# Patient Record
Sex: Female | Born: 1945
Health system: Southern US, Community
[De-identification: ages and names within clinical notes are randomized; demographics above are authoritative.]

## PROBLEM LIST (undated history)

## (undated) DIAGNOSIS — C801 Malignant (primary) neoplasm, unspecified: Secondary | ICD-10-CM

## (undated) DIAGNOSIS — K5790 Diverticulosis of intestine, part unspecified, without perforation or abscess without bleeding: Secondary | ICD-10-CM

## (undated) DIAGNOSIS — T7840XA Allergy, unspecified, initial encounter: Secondary | ICD-10-CM

## (undated) DIAGNOSIS — G8929 Other chronic pain: Secondary | ICD-10-CM

## (undated) DIAGNOSIS — M810 Age-related osteoporosis without current pathological fracture: Secondary | ICD-10-CM

## (undated) DIAGNOSIS — K8020 Calculus of gallbladder without cholecystitis without obstruction: Secondary | ICD-10-CM

## (undated) DIAGNOSIS — I10 Essential (primary) hypertension: Secondary | ICD-10-CM

## (undated) DIAGNOSIS — E669 Obesity, unspecified: Secondary | ICD-10-CM

## (undated) DIAGNOSIS — M1990 Unspecified osteoarthritis, unspecified site: Secondary | ICD-10-CM

## (undated) DIAGNOSIS — K760 Fatty (change of) liver, not elsewhere classified: Secondary | ICD-10-CM

## (undated) DIAGNOSIS — K219 Gastro-esophageal reflux disease without esophagitis: Secondary | ICD-10-CM

## (undated) DIAGNOSIS — E785 Hyperlipidemia, unspecified: Secondary | ICD-10-CM

## (undated) DIAGNOSIS — R0602 Shortness of breath: Secondary | ICD-10-CM

## (undated) HISTORY — DX: Diverticulosis of intestine, part unspecified, without perforation or abscess without bleeding: K57.90

## (undated) HISTORY — PX: COLONOSCOPY: SHX174

## (undated) HISTORY — PX: CHOLECYSTECTOMY: SHX55

## (undated) HISTORY — DX: Calculus of gallbladder without cholecystitis without obstruction: K80.20

## (undated) HISTORY — DX: Essential (primary) hypertension: I10

## (undated) HISTORY — DX: Hyperlipidemia, unspecified: E78.5

## (undated) HISTORY — DX: Age-related osteoporosis without current pathological fracture: M81.0

## (undated) HISTORY — DX: Fatty (change of) liver, not elsewhere classified: K76.0

## (undated) HISTORY — DX: Obesity, unspecified: E66.9

## (undated) HISTORY — DX: Gastro-esophageal reflux disease without esophagitis: K21.9

## (undated) HISTORY — PX: ABDOMINAL HYSTERECTOMY: SHX81

## (undated) HISTORY — DX: Malignant (primary) neoplasm, unspecified: C80.1

## (undated) HISTORY — PX: ERCP: SHX60

---

## 1997-09-15 ENCOUNTER — Other Ambulatory Visit: Admission: RE | Admit: 1997-09-15 | Discharge: 1997-09-15 | Payer: Self-pay | Admitting: *Deleted

## 1998-01-18 ENCOUNTER — Ambulatory Visit (HOSPITAL_COMMUNITY): Admission: RE | Admit: 1998-01-18 | Discharge: 1998-01-18 | Payer: Self-pay | Admitting: Rheumatology

## 1998-07-12 ENCOUNTER — Encounter: Admission: RE | Admit: 1998-07-12 | Discharge: 1998-08-09 | Payer: Self-pay | Admitting: Family Medicine

## 1998-11-30 ENCOUNTER — Other Ambulatory Visit: Admission: RE | Admit: 1998-11-30 | Discharge: 1998-11-30 | Payer: Self-pay | Admitting: *Deleted

## 1999-01-26 ENCOUNTER — Encounter: Payer: Self-pay | Admitting: Rheumatology

## 1999-01-26 ENCOUNTER — Inpatient Hospital Stay (HOSPITAL_COMMUNITY): Admission: AD | Admit: 1999-01-26 | Discharge: 1999-01-28 | Payer: Self-pay | Admitting: Rheumatology

## 1999-01-27 ENCOUNTER — Encounter: Payer: Self-pay | Admitting: Rheumatology

## 1999-02-03 ENCOUNTER — Encounter: Payer: Self-pay | Admitting: Internal Medicine

## 1999-02-03 ENCOUNTER — Encounter: Payer: Self-pay | Admitting: Gastroenterology

## 1999-02-03 ENCOUNTER — Encounter (INDEPENDENT_AMBULATORY_CARE_PROVIDER_SITE_OTHER): Payer: Self-pay | Admitting: *Deleted

## 1999-02-03 ENCOUNTER — Observation Stay (HOSPITAL_COMMUNITY): Admission: EM | Admit: 1999-02-03 | Discharge: 1999-02-04 | Payer: Self-pay | Admitting: Gastroenterology

## 1999-04-15 ENCOUNTER — Encounter: Payer: Self-pay | Admitting: Gastroenterology

## 1999-04-15 ENCOUNTER — Encounter: Payer: Self-pay | Admitting: Internal Medicine

## 1999-04-15 ENCOUNTER — Ambulatory Visit (HOSPITAL_COMMUNITY): Admission: RE | Admit: 1999-04-15 | Discharge: 1999-04-15 | Payer: Self-pay | Admitting: Gastroenterology

## 1999-04-15 ENCOUNTER — Encounter (INDEPENDENT_AMBULATORY_CARE_PROVIDER_SITE_OTHER): Payer: Self-pay | Admitting: *Deleted

## 1999-05-05 ENCOUNTER — Emergency Department (HOSPITAL_COMMUNITY): Admission: EM | Admit: 1999-05-05 | Discharge: 1999-05-05 | Payer: Self-pay | Admitting: Emergency Medicine

## 1999-07-20 ENCOUNTER — Encounter: Admission: RE | Admit: 1999-07-20 | Discharge: 1999-07-20 | Payer: Self-pay | Admitting: Rheumatology

## 1999-07-20 ENCOUNTER — Encounter: Payer: Self-pay | Admitting: Rheumatology

## 1999-09-26 ENCOUNTER — Emergency Department (HOSPITAL_COMMUNITY): Admission: EM | Admit: 1999-09-26 | Discharge: 1999-09-27 | Payer: Self-pay

## 1999-09-26 ENCOUNTER — Encounter: Payer: Self-pay | Admitting: Emergency Medicine

## 2000-03-29 ENCOUNTER — Other Ambulatory Visit: Admission: RE | Admit: 2000-03-29 | Discharge: 2000-03-29 | Payer: Self-pay | Admitting: *Deleted

## 2001-04-08 ENCOUNTER — Encounter: Admission: RE | Admit: 2001-04-08 | Discharge: 2001-04-08 | Payer: Self-pay | Admitting: Rheumatology

## 2001-04-08 ENCOUNTER — Encounter: Payer: Self-pay | Admitting: Rheumatology

## 2001-04-09 ENCOUNTER — Other Ambulatory Visit: Admission: RE | Admit: 2001-04-09 | Discharge: 2001-04-09 | Payer: Self-pay | Admitting: *Deleted

## 2002-04-25 ENCOUNTER — Encounter: Admission: RE | Admit: 2002-04-25 | Discharge: 2002-04-25 | Payer: Self-pay | Admitting: Rheumatology

## 2002-04-25 ENCOUNTER — Encounter: Payer: Self-pay | Admitting: Rheumatology

## 2002-05-08 ENCOUNTER — Other Ambulatory Visit: Admission: RE | Admit: 2002-05-08 | Discharge: 2002-05-08 | Payer: Self-pay | Admitting: *Deleted

## 2002-12-14 ENCOUNTER — Emergency Department (HOSPITAL_COMMUNITY): Admission: EM | Admit: 2002-12-14 | Discharge: 2002-12-14 | Payer: Self-pay | Admitting: Emergency Medicine

## 2002-12-17 ENCOUNTER — Encounter (INDEPENDENT_AMBULATORY_CARE_PROVIDER_SITE_OTHER): Payer: Self-pay | Admitting: *Deleted

## 2002-12-17 ENCOUNTER — Encounter: Payer: Self-pay | Admitting: Internal Medicine

## 2002-12-17 ENCOUNTER — Ambulatory Visit (HOSPITAL_COMMUNITY): Admission: RE | Admit: 2002-12-17 | Discharge: 2002-12-17 | Payer: Self-pay | Admitting: Gastroenterology

## 2002-12-17 ENCOUNTER — Encounter: Payer: Self-pay | Admitting: Gastroenterology

## 2002-12-22 ENCOUNTER — Emergency Department (HOSPITAL_COMMUNITY): Admission: EM | Admit: 2002-12-22 | Discharge: 2002-12-22 | Payer: Self-pay | Admitting: Emergency Medicine

## 2002-12-22 ENCOUNTER — Encounter: Payer: Self-pay | Admitting: Emergency Medicine

## 2003-06-18 ENCOUNTER — Other Ambulatory Visit: Admission: RE | Admit: 2003-06-18 | Discharge: 2003-06-18 | Payer: Self-pay | Admitting: *Deleted

## 2003-08-26 ENCOUNTER — Observation Stay (HOSPITAL_COMMUNITY): Admission: EM | Admit: 2003-08-26 | Discharge: 2003-08-27 | Payer: Self-pay | Admitting: Emergency Medicine

## 2003-10-08 ENCOUNTER — Encounter: Admission: RE | Admit: 2003-10-08 | Discharge: 2003-10-08 | Payer: Self-pay | Admitting: Rheumatology

## 2004-07-11 ENCOUNTER — Other Ambulatory Visit: Admission: RE | Admit: 2004-07-11 | Discharge: 2004-07-11 | Payer: Self-pay | Admitting: *Deleted

## 2004-11-23 ENCOUNTER — Encounter: Admission: RE | Admit: 2004-11-23 | Discharge: 2004-11-23 | Payer: Self-pay | Admitting: Rheumatology

## 2005-09-20 ENCOUNTER — Encounter: Admission: RE | Admit: 2005-09-20 | Discharge: 2005-09-20 | Payer: Self-pay | Admitting: Gastroenterology

## 2005-09-20 ENCOUNTER — Encounter (INDEPENDENT_AMBULATORY_CARE_PROVIDER_SITE_OTHER): Payer: Self-pay | Admitting: *Deleted

## 2005-09-20 ENCOUNTER — Encounter: Payer: Self-pay | Admitting: Internal Medicine

## 2006-01-03 ENCOUNTER — Encounter: Admission: RE | Admit: 2006-01-03 | Discharge: 2006-01-03 | Payer: Self-pay | Admitting: Rheumatology

## 2006-09-17 ENCOUNTER — Other Ambulatory Visit: Admission: RE | Admit: 2006-09-17 | Discharge: 2006-09-17 | Payer: Self-pay | Admitting: *Deleted

## 2007-01-08 ENCOUNTER — Encounter: Admission: RE | Admit: 2007-01-08 | Discharge: 2007-01-08 | Payer: Self-pay | Admitting: Endocrinology

## 2007-10-07 ENCOUNTER — Other Ambulatory Visit: Admission: RE | Admit: 2007-10-07 | Discharge: 2007-10-07 | Payer: Self-pay | Admitting: Gynecology

## 2008-01-10 ENCOUNTER — Encounter: Admission: RE | Admit: 2008-01-10 | Discharge: 2008-01-10 | Payer: Self-pay | Admitting: Endocrinology

## 2008-08-06 ENCOUNTER — Emergency Department (HOSPITAL_COMMUNITY): Admission: EM | Admit: 2008-08-06 | Discharge: 2008-08-07 | Payer: Self-pay | Admitting: Emergency Medicine

## 2008-08-07 ENCOUNTER — Ambulatory Visit (HOSPITAL_COMMUNITY): Admission: RE | Admit: 2008-08-07 | Discharge: 2008-08-07 | Payer: Self-pay | Admitting: Internal Medicine

## 2008-08-07 ENCOUNTER — Encounter (INDEPENDENT_AMBULATORY_CARE_PROVIDER_SITE_OTHER): Payer: Self-pay | Admitting: *Deleted

## 2008-08-23 ENCOUNTER — Emergency Department (HOSPITAL_COMMUNITY): Admission: EM | Admit: 2008-08-23 | Discharge: 2008-08-23 | Payer: Self-pay | Admitting: Family Medicine

## 2008-12-09 ENCOUNTER — Encounter (INDEPENDENT_AMBULATORY_CARE_PROVIDER_SITE_OTHER): Payer: Self-pay | Admitting: *Deleted

## 2008-12-11 ENCOUNTER — Ambulatory Visit: Payer: Self-pay | Admitting: Internal Medicine

## 2008-12-11 DIAGNOSIS — K5900 Constipation, unspecified: Secondary | ICD-10-CM | POA: Insufficient documentation

## 2008-12-11 DIAGNOSIS — K219 Gastro-esophageal reflux disease without esophagitis: Secondary | ICD-10-CM | POA: Insufficient documentation

## 2008-12-11 DIAGNOSIS — K8050 Calculus of bile duct without cholangitis or cholecystitis without obstruction: Secondary | ICD-10-CM | POA: Insufficient documentation

## 2008-12-11 DIAGNOSIS — R1011 Right upper quadrant pain: Secondary | ICD-10-CM

## 2008-12-11 DIAGNOSIS — E669 Obesity, unspecified: Secondary | ICD-10-CM | POA: Insufficient documentation

## 2009-09-08 ENCOUNTER — Emergency Department (HOSPITAL_COMMUNITY): Admission: EM | Admit: 2009-09-08 | Discharge: 2009-09-08 | Payer: Self-pay | Admitting: Emergency Medicine

## 2010-06-20 LAB — BASIC METABOLIC PANEL
CO2: 29 mEq/L (ref 19–32)
GFR calc non Af Amer: >60 mL/min (ref >60)
Glucose, Bld: 121 mg/dL — ABNORMAL HIGH (ref 70–99)
Potassium: 3.3 mEq/L — ABNORMAL LOW (ref 3.5–5.1)
Sodium: 141 mEq/L (ref 135–145)

## 2010-06-20 LAB — DIFFERENTIAL
Basophils Absolute: 0 10*3/uL (ref 0.0–0.1)
Eosinophils Relative: 1 % (ref 0–5)
Lymphocytes Relative: 29 % (ref 12–46)
Monocytes Absolute: 0.9 10*3/uL (ref 0.1–1.0)
Monocytes Relative: 10 % (ref 3–12)

## 2010-06-20 LAB — CBC
HCT: 40 % (ref 36.0–46.0)
Hemoglobin: 13.1 g/dL (ref 12.0–15.0)
MCHC: 32.9 g/dL (ref 30.0–36.0)
RDW: 13.9 % (ref 11.5–15.5)

## 2010-06-20 LAB — POCT CARDIAC MARKERS
CKMB, poc: 1.2 ng/mL (ref 1.0–8.0)
Myoglobin, poc: 80.7 ng/mL (ref 12–200)

## 2010-07-12 LAB — URINALYSIS, ROUTINE W REFLEX MICROSCOPIC
Bilirubin Urine: NEGATIVE
Glucose, UA: NEGATIVE mg/dL
Hgb urine dipstick: NEGATIVE
Ketones, ur: NEGATIVE mg/dL
Protein, ur: NEGATIVE mg/dL

## 2010-07-12 LAB — DIFFERENTIAL
Basophils Absolute: 0 10*3/uL (ref 0.0–0.1)
Basophils Relative: 0 % (ref 0–1)
Eosinophils Relative: 0 % (ref 0–5)
Lymphocytes Relative: 17 % (ref 12–46)
Neutro Abs: 9 10*3/uL — ABNORMAL HIGH (ref 1.7–7.7)

## 2010-07-12 LAB — COMPREHENSIVE METABOLIC PANEL
AST: 86 U/L — ABNORMAL HIGH (ref 0–37)
Alkaline Phosphatase: 114 U/L (ref 39–117)
BUN: 14 mg/dL (ref 6–23)
CO2: 27 mEq/L (ref 19–32)
Chloride: 107 mEq/L (ref 96–112)
Creatinine, Ser: 0.81 mg/dL (ref 0.4–1.2)
GFR calc non Af Amer: >60 mL/min (ref >60)
Potassium: 3.7 mEq/L (ref 3.5–5.1)
Total Bilirubin: 0.8 mg/dL (ref 0.3–1.2)

## 2010-07-12 LAB — CBC
HCT: 41.7 % (ref 36.0–46.0)
MCV: 94.4 fL (ref 78.0–100.0)
RBC: 4.42 MIL/uL (ref 3.87–5.11)
WBC: 11.8 10*3/uL — ABNORMAL HIGH (ref 4.0–10.5)

## 2010-08-19 NOTE — H&P (Signed)
NAME:  Krystal Watson, Krystal Watson                          ACCOUNT NO.:  [id number]   MEDICAL RECORD NO.:  [id number]                   PATIENT TYPE:  OBV   LOCATION:  3735                                 FACILITY:  [facility]   PHYSICIAN:  Meade Maw, M.D.                 DATE OF BIRTH:  1945-08-04   DATE OF ADMISSION:  08/26/2003  DATE OF DISCHARGE:                                HISTORY & PHYSICAL   REASON FOR OBSERVATION:  Chest pain.   HISTORY:  Ellason Segar is a very pleasant 65 year old female who presented  to her primary care physician, Dr. Demetria Pore. Levitin, with complaints of  precordial chest pain associated with nausea and diaphoresis.  The chest  pain itself was approximately 1 minute in duration, however, she  subsequently developed left arm pain which persisted for approximately 10  minutes.  The chest pain was described as a 5/10 in severity.  There were no  real aggravating or alleviating factors noted.  She had had a similar  episode of left arm pain 1 week prior to this presentation, otherwise, she  has had no chest pain.  She has significant coronary risk factors including  postmenopausal status, hypertension, dyslipidemia and diabetes mellitus.  She currently is pain-free.   PAST MEDICAL HISTORY:  Her past medical history is as noted above,  significant for:  1. Diabetes mellitus since 1998.  2. Hyperlipidemia.  3. Hypertension.  4. Cholelithiasis, status post ERCP with stone extraction.   PAST SURGICAL HISTORY:  Past surgical history is significant for:  1. Cholecystectomy.  2. Vaginal hysterectomy for fibroids.  3. Right toe surgery.   CURRENT MEDICATIONS:  Current medications include:  1. Diltiazem 240 mg daily.  2. Altace 10 mg daily.  3. Hydrochlorothiazide 25 mg daily.  4. Actos 30 mg daily.  5. Metformin 1 g in the morning, 1 at night.  6. Nexium 40 mg in the morning.  7. Lipitor 80 mg daily.  8. Calcium.  9. Enteric-coated aspirin 81 mg daily.   ALLERGIES:  Allergies are to SULFA.   FAMILY HISTORY:  She is married, lives with her husband, teaches high school  English.   FAMILY HISTORY:  Family history significant for diabetes and hypertension.  There is no history of alcohol or tobacco use.   REVIEW OF SYSTEMS:  She denies palpitations or tachyarrhythmias.  She has  had mild pedal edema which is worse in the evening.  She has had no  orthopnea.  She has had noted no blood in the stool.  She has had no recent  headaches.   PHYSICAL EXAMINATION:  VITAL SIGNS:  On physical exam, blood pressure is  122/70 in the right arm, 124/70, left arm; heart rate is 90; respiratory  rate is 20; temperature is 99.1.  HEENT:  Unremarkable.  NECK:  She has good carotid upstroke, there are no carotid bruits.  PULMONARY:  Exam  reveals breath sounds which are equal and clear to  auscultation.  CARDIOVASCULAR:  Exam reveals a regular rate and rhythm, normal S1, normal  S2, no murmurs, rubs, or gallops noted.  ABDOMEN:  Abdomen is soft, benign, nontender.  EXTREMITIES:  Extremities reveal mild pedal edema.  NEUROLOGIC:  Nonfocal.  SKIN:  Skin is warm and dry.   LABORATORY AND ACCESSORY CLINICAL DATA:  ECG was reviewed and reveals a  normal sinus rhythm with no ischemic changes noted.   IMPRESSION:  31. A 65 year old female who is currently pain-free, but with significant     risk factors with prolonged arm pain.  We will admit for observation,     obtain serial cardiac enzymes.  Stress Cardiolite will be performed.     Serial enzymes are normal.  She will continue with aspirin 81 mg daily.     She has been given aspirin 325 mg in the office.  2. Hypertension.  Blood pressure is currently well-controlled.  We will     continue with her current regimen of diltiazem, Altace and     hydrochlorothiazide.  3. Diabetes mellitus.  We will continue with her current regimen of Actos     and Metformin.  4. Dyslipidemia.  We will continue with Lipitor  at 80 mg daily.                                                Meade Maw, M.D.    HP/MEDQ  D:  08/26/2003  T:  08/27/2003  Job:  [id number]

## 2010-08-19 NOTE — Op Note (Signed)
NAME:  Krystal Watson, Krystal Watson                          ACCOUNT NO.:  [id number]   MEDICAL RECORD NO.:  [id number]                   PATIENT TYPE:  AMB   LOCATION:  ENDO                                 FACILITY:  [facility]   PHYSICIAN:  Petra Kuba, M.D.                 DATE OF BIRTH:  1946-01-18   DATE OF PROCEDURE:  12/17/2002  DATE OF DISCHARGE:                                 OPERATIVE REPORT   PROCEDURE PERFORMED:  Endoscopic retrograde cholangiopancreatography with  sphincterotomy and stone extraction.   ENDOSCOPIST:  Petra Kuba, M.D.   INDICATIONS FOR PROCEDURE:  Patient with right upper quadrant pain, fever,  elevated liver tests, history of common bile duct stone and sphincterotomy  four years ago.  Probable recurrence.  Consent was signed after the risks,  benefits, methods and options were thoroughly rediscussed today in the  office with both the patient and her husband.   MEDICINES USED:  Demerol 125 mg, Versed 10 mg.   DESCRIPTION OF PROCEDURE:  The side-viewing therapeutic video duodenoscope  was inserted by indirect vision into the stomach, advanced into the duodenum  and a normal appearing ampulla was brought into view.  There was a small  periampullary diverticulum.  Initially, we cannulated one ostia and a normal  PD injection was obtained.  Once we realized it was the PD, it was not  overfilled.  It did drain readily.  We did try to recannulate but did not  inject dye but could not get adequate positioning to feel like we were  angling towards the CBD.  We did try probing with the wire one time which  went up the PD preferentially.  We stopped advancing the wire, once we  realized the direction it was going.  On further inspection of the ampulla,  there seemed to be a second ostia which when cannulated, deep selective  cannulation in the CBD was obtained.  On initial injection, a moderate sized  CBD stone was seen but no other gross abnormalities.  The JAG-wire was  easily inserted deep into the intrahepatics.  We went head and proceeded  with a sphincterotomy in the customary fashion until we were easily able to  get the fully bowed sphincterotome in and out of the duct.  She did have an  overall small ampulla so this was not a very large sphincterotomy.  Once  that was done we exchanged for a 12 mm balloon which did have moderate to  significant resistance in withdrawing through the ampulla and lots of debris  and small stones and sludge were withdrawn.  We did pop the balloon after  three pullthroughs.  We elected at this juncture since there was still a lot  of sludge in the bile duct to increase the sphincterotomy site which was  done in a customary fashion.  Again, a fully bowed sphincterotome was easily  able to be  advanced in and out of the duct.  We did enlarge a moderate  amount.  We went ahead and used an 8.5 mm balloon which withdrew through the  ampulla much easier with only mild to moderate resistance and again on each  pull through less stones and sludge and debris were withdrawn.  We did try  to do an occlusion cholangiogram after about five pull throughs but there  was still too much air in the system, so we went ahead and recannulated with  the 12 mm balloon.  As an aside, based on the increased sphincterotomy, we  did not need to use the wire and based on her having two separate ostia, we  were easily able to recannulate with just the balloon. The 12 mm balloon did  withdraw a little bit of sludge on each pull through which was done three or  four times and occlusion cholangiogram did not reveal any obvious filling  defect.  This balloon pass through with only mild to moderate resistance as  well.  We elected to stop the procedure at this juncture.  The scope was  removed.  The patient tolerated the procedure well. There were no obvious  immediate complication.   ENDOSCOPIC DIAGNOSIS:  1. Small to normal ampulla with a small  periampullary diverticulum.  2. Two ostia.  3. One normal pancreatic duct injection and one advancement of the wire into     the pancreatic duct.  4. Cannulated the second ostia with deep selective cannulation of the common     bile duct and one stone seen on initial injection.  5. Increased sphincterotomy times two.  6. Multiple 8.5 and 12 mm balloon pull throughs with multiple debris, stones     and sludge.  7. Negative occlusion cholangiogram at the end of the procedure with     excellent drainage.   PLAN:  Customary post endoscopic retrograde cholangiopancreatography orders  if no problem in a few hours' observation in endo unit, feel should be okay  to go home.  Happy to see back p.r.n. or in two weeks to check symptoms and  labs to make sure no further work-up plans are needed.                                               Petra Kuba, M.D.    MEM/MEDQ  D:  12/17/2002  T:  12/18/2002  Job:  [id number]   cc:   Demetria Pore. Coral Spikes, M.D.  [address]  [address]  [place]  [place] [address]  Fax: [phone]

## 2011-05-31 ENCOUNTER — Encounter: Payer: Self-pay | Admitting: Internal Medicine

## 2011-05-31 ENCOUNTER — Ambulatory Visit (INDEPENDENT_AMBULATORY_CARE_PROVIDER_SITE_OTHER): Payer: BC Managed Care – PPO | Admitting: Internal Medicine

## 2011-05-31 VITALS — BP 134/76 | HR 62 | Ht 63.0 in | Wt 266.0 lb

## 2011-05-31 DIAGNOSIS — Z800 Family history of malignant neoplasm of digestive organs: Secondary | ICD-10-CM

## 2011-05-31 DIAGNOSIS — K5900 Constipation, unspecified: Secondary | ICD-10-CM

## 2011-05-31 DIAGNOSIS — E669 Obesity, unspecified: Secondary | ICD-10-CM

## 2011-05-31 DIAGNOSIS — R143 Flatulence: Secondary | ICD-10-CM

## 2011-05-31 DIAGNOSIS — E119 Type 2 diabetes mellitus without complications: Secondary | ICD-10-CM

## 2011-05-31 DIAGNOSIS — R141 Gas pain: Secondary | ICD-10-CM

## 2011-05-31 MED ORDER — ALIGN 4 MG PO CAPS: 1.0000 | ORAL_CAPSULE | Freq: Every day | ORAL | Status: DC

## 2011-05-31 MED ORDER — PEG-KCL-NACL-NASULF-NA ASC-C 100 G PO SOLR: 1.0000 | Freq: Once | ORAL | Status: DC

## 2011-05-31 NOTE — Patient Instructions (Signed)
You have been scheduled for a colonoscopy with propofol. Please follow written instructions given to you at your visit today.  Please pick up your prep kit at the pharmacy within the next 1-3 days.  We have given you samples of Align. This puts good bacteria back into your intestines. You should take 1 capsule by mouth once daily. If this works well for you, it can be purchased over the counter.

## 2011-05-31 NOTE — Progress Notes (Signed)
HISTORY OF PRESENT ILLNESS:  Krystal Watson is a 66 y.o. female African American female withdiabetes mellitus, hypertension, hyperlipidemia, and morbid obesity. She self-referred regarding problems with increased intestinal gas. She has a history of chronic right-sided pain. She is status post hysterectomy and cholecystectomy. She has a history of choledocholithiasis for which she underwent ERCP with common duct stone removal by Dr. Vida Rigger (2001 and again in 2004). She was last seen in September 2010 regarding right-sided abdominal pain that she's had this for years (see that dictation for details).  She has multiple volumes of outside records which I have reviewed previously. These include multiple office visits with Dr. Ewing Schlein as well as multiple procedures and laboratories.. In summary, she has had complaints of right-sided abdominal pain of at least 10 years duration. In addition to 2 ERCPs as described, she has had incomplete colonoscopy with coupled negative barium enema in 2001 and negative virtual colonoscopy in 2007.[name] Other GI complaints include intermittent heartburn and indigestion with regurgitation. Currently on no medications. Also, chronic intermittent constipation which continues. Today, she reports having had severe sharp epigastric discomfort after consuming a hot dog with chilly, onions, and coleslaw. She subsequently took Gaviscon and then road her exercise bike, all in the attempts of inducing belching. At one point, she belched multiple times with complete relief of abdominal discomfort. Her other complaint is that of bloating. No nausea, vomiting, or dysphagia. She continues to be significantly overweight. Her constipation is without change, but she denies bleeding. She does have family history of colon cancer in her father ( advanced age) with previous evaluations as noted above. She is due for, and interested in, followup.   REVIEW OF SYSTEMS:  All non-GI ROS negative except for  fatigue and night sweats, joint aches  Past Medical History  Diagnosis Date  . Diabetes mellitus   . Diverticulosis   . Gallstones   . Hyperlipidemia   . Hypertension   . Obesity     Past Surgical History  Procedure Date  . Cholecystectomy   . Abdominal hysterectomy     Social History MILI PILTZ  reports that she has never smoked. She has never used smokeless tobacco. She reports that she drinks alcohol. She reports that she does not use illicit drugs.  family history includes Breast cancer in her sister; Colon cancer (age of onset:80) in her father; Diabetes in her mother; and Heart disease in her mother.  Allergies  Allergen Reactions  . Sulfonamide Derivatives        PHYSICAL EXAMINATION: Vital signs: BP 134/76  Pulse 62  Ht 5\' 3"  (1.6 m)  Wt 266 lb (120.657 kg)  BMI 47.12 kg/m2  Constitutional: obese,generally well-appearing, no acute distress Psychiatric: alert and oriented x3, cooperative Eyes: extraocular movements intact, anicteric, conjunctiva pink Mouth: oral pharynx moist, no lesions Neck: supple no lymphadenopathy Cardiovascular: heart regular rate and rhythm, no murmur Lungs: clear to auscultation bilaterally Abdomen: soft,obese, nontender, nondistended, no obvious ascites, no peritoneal signs, normal bowel sounds, no organomegaly Rectal:deferred until colonoscopy Extremities: no lower extremity edema bilaterally Skin: no lesions on visible extremities Neuro: No focal deficits.   ASSESSMENT:  #1. Increased intestinal gas with dietary indiscretion #2. Family history of colon cancer #3. Morbid obesity #4. Diabetes mellitus #5. Functional constipation. Increase fiber and water. MiraLax when necessary   PLAN:  #1. Gas and flatulence dietary sheet provided #2. Discussion on intestinal gas. As well, literature on intestinal gas provided for her review #3. Empiric course of probiotic  Align, one daily for 2 weeks. Samples given #4. High-risk  screening colonoscopy. Incomplete exam noted previously. CRNA supervise propofol planned.The nature of the procedure, as well as the risks, benefits, and alternatives were carefully and thoroughly reviewed with the patient. Ample time for discussion and questions allowed. The patient understood, was satisfied, and agreed to proceed. Movi prep prescribed. The patient instructed on its use #5. Hold diabetic medications the morning of the examination until oral intake resumed

## 2011-06-13 ENCOUNTER — Encounter: Payer: Self-pay | Admitting: Internal Medicine

## 2011-06-13 ENCOUNTER — Ambulatory Visit (AMBULATORY_SURGERY_CENTER): Payer: BC Managed Care – PPO | Admitting: Internal Medicine

## 2011-06-13 VITALS — BP 121/95 | HR 57 | Temp 97.3°F | Resp 18 | Ht 63.0 in | Wt 266.0 lb

## 2011-06-13 DIAGNOSIS — R142 Eructation: Secondary | ICD-10-CM

## 2011-06-13 DIAGNOSIS — Z1211 Encounter for screening for malignant neoplasm of colon: Secondary | ICD-10-CM

## 2011-06-13 DIAGNOSIS — K5900 Constipation, unspecified: Secondary | ICD-10-CM

## 2011-06-13 DIAGNOSIS — Z800 Family history of malignant neoplasm of digestive organs: Secondary | ICD-10-CM

## 2011-06-13 LAB — GLUCOSE, CAPILLARY: Glucose-Capillary: 99 mg/dL (ref 70–99)

## 2011-06-13 MED ORDER — SODIUM CHLORIDE 0.9 % IV SOLN: 500.0000 mL | INTRAVENOUS | Status: DC

## 2011-06-13 NOTE — Patient Instructions (Signed)
YOU HAD AN ENDOSCOPIC PROCEDURE TODAY AT THE [facility]: Refer to the procedure report that was given to you for any specific questions about what was found during the examination.  If the procedure report does not answer your questions, please call your gastroenterologist to clarify.  If you requested that your care partner not be given the details of your procedure findings, then the procedure report has been included in a sealed envelope for you to review at your convenience later.  YOU SHOULD EXPECT: Some feelings of bloating in the abdomen. Passage of more gas than usual.  Walking can help get rid of the air that was put into your GI tract during the procedure and reduce the bloating. If you had a lower endoscopy (such as a colonoscopy or flexible sigmoidoscopy) you may notice spotting of blood in your stool or on the toilet paper. If you underwent a bowel prep for your procedure, then you may not have a normal bowel movement for a few days.  DIET: Your first meal following the procedure should be a light meal and then it is ok to progress to your normal diet.  A half-sandwich or bowl of soup is an example of a good first meal.  Heavy or fried foods are harder to digest and may make you feel nauseous or bloated.  Likewise meals heavy in dairy and vegetables can cause extra gas to form and this can also increase the bloating.  Drink plenty of fluids but you should avoid alcoholic beverages for 24 hours.  ACTIVITY: Your care partner should take you home directly after the procedure.  You should plan to take it easy, moving slowly for the rest of the day.  You can resume normal activity the day after the procedure however you should NOT DRIVE or use heavy machinery for 24 hours (because of the sedation medicines used during the test).    SYMPTOMS TO REPORT IMMEDIATELY: A gastroenterologist can be reached at any hour.  During normal business hours, 8:30 AM to 5:00 PM Monday through Friday,  call [phone].  After hours and on weekends, please call the GI answering service at [phone] who will take a message and have the physician on call contact you.   Following lower endoscopy (colonoscopy or flexible sigmoidoscopy):  Excessive amounts of blood in the stool  Significant tenderness or worsening of abdominal pains  Swelling of the abdomen that is new, acute  Fever of 100F or higher    FOLLOW UP: If any biopsies were taken you will be contacted by phone or by letter within the next 1-3 weeks.  Call your gastroenterologist if you have not heard about the biopsies in 3 weeks.  Our staff will call the home number listed on your records the next business day following your procedure to check on you and address any questions or concerns that you may have at that time regarding the information given to you following your procedure. This is a courtesy call and so if there is no answer at the home number and we have not heard from you through the emergency physician on call, we will assume that you have returned to your regular daily activities without incident.  SIGNATURES/CONFIDENTIALITY: You and/or your care partner have signed paperwork which will be entered into your electronic medical record.  These signatures attest to the fact that that the information above on your After Visit Summary has been reviewed and is understood.  Full responsibility of the confidentiality   of this discharge information lies with you and/or your care-partner.     

## 2011-06-13 NOTE — Progress Notes (Signed)
Patient did not experience any of the following events: a burn prior to discharge; a fall within the facility; wrong site/side/patient/procedure/implant event; or a hospital transfer or hospital admission upon discharge from the facility. (G8907) Patient did not have preoperative order for IV antibiotic SSI prophylaxis. (G8918)  

## 2011-06-13 NOTE — Op Note (Signed)
[facility] [address] [place], [place]  [address]  COLONOSCOPY PROCEDURE REPORT  PATIENT:  Krystal Watson, Krystal Watson  MR#:  [id number] BIRTHDATE:  1945-07-13, 65 yrs. old  GENDER:  female ENDOSCOPIST:  Wilhemina Bonito. Eda Keys, MD REF. BY:  Renford Dills, M.D. PROCEDURE DATE:  06/13/2011 PROCEDURE:  Higher-risk screening colonoscopy G0105  ASA CLASS:  Class II INDICATIONS:  surveillance and high-risk screening, family history of colon cancer (father advanced age) ; incomplete exam 2001 w/ BE and negative VC 2007 (both elsewhere) MEDICATIONS:   MAC sedation, administered by CRNA, propofol (Diprivan) 300 mg IV  DESCRIPTION OF PROCEDURE:   After the risks benefits and alternatives of the procedure were thoroughly explained, informed consent was obtained.  Digital rectal exam was performed and revealed no abnormalities.   The LB [id number] endoscope was introduced through the anus and advanced to the cecum, which was identified by both the appendix and ileocecal valve, without limitations.  The quality of the prep was excellent, using MoviPrep.  The instrument was then slowly withdrawn as the colon was fully examined. <<PROCEDUREIMAGES>>  FINDINGS:  Mild diverticulosis was found in the sigmoid colon. Otherwise normal colonoscopy without polyps, masses, vascular ectasias, or inflammatory changes.   Retroflexed views in the rectum revealed no abnormalities.  The time to cecum = 3:17 minutes. The scope was then withdrawn in  9:35  minutes from the cecum and the procedure completed.  COMPLICATIONS:  None  ENDOSCOPIC IMPRESSION: 1) Mild diverticulosis in the sigmoid colon 2) Otherwise normal colonoscopy  RECOMMENDATIONS: 1) Continue current colorectal screening recommendations for "routine risk" patients with a repeat colonoscopy in 10 years.  ______________________________ Wilhemina Bonito. Eda Keys, MD  CC:  Renford Dills MD;  The Patient  n. eSIGNED:   Wilhemina Bonito. Eda Keys at  06/13/2011 09:53 AM  Darrin Luis, [phone]

## 2011-06-14 ENCOUNTER — Telehealth: Payer: Self-pay | Admitting: *Deleted

## 2011-06-14 NOTE — Telephone Encounter (Signed)
  Follow up Call-  Call back number 06/13/2011  Post procedure Call Back phone  # [phone] hm  Permission to leave phone message Yes     Patient questions:  Do you have a fever, pain , or abdominal swelling? no Pain Score  0 *  Have you tolerated food without any problems? yes  Have you been able to return to your normal activities? yes  Do you have any questions about your discharge instructions: Diet   no Medications  no Follow up visit  no  Do you have questions or concerns about your Care? no  Actions: * If pain score is 4 or above: No action needed, pain <4.

## 2012-02-16 ENCOUNTER — Other Ambulatory Visit: Payer: Self-pay | Admitting: Gynecology

## 2012-02-16 DIAGNOSIS — R928 Other abnormal and inconclusive findings on diagnostic imaging of breast: Secondary | ICD-10-CM

## 2012-02-28 ENCOUNTER — Ambulatory Visit
Admission: RE | Admit: 2012-02-28 | Discharge: 2012-02-28 | Disposition: A | Discharge status: Home / Self Care | Payer: BC Managed Care – PPO | Source: Ambulatory Visit | Attending: Gynecology | Admitting: Gynecology

## 2012-02-28 ENCOUNTER — Other Ambulatory Visit: Payer: Self-pay | Admitting: Gynecology

## 2012-02-28 DIAGNOSIS — R928 Other abnormal and inconclusive findings on diagnostic imaging of breast: Secondary | ICD-10-CM

## 2012-03-12 ENCOUNTER — Ambulatory Visit
Admission: RE | Admit: 2012-03-12 | Discharge: 2012-03-12 | Disposition: A | Discharge status: Home / Self Care | Payer: BC Managed Care – PPO | Source: Ambulatory Visit | Attending: Gynecology | Admitting: Gynecology

## 2012-03-12 ENCOUNTER — Other Ambulatory Visit: Payer: Self-pay | Admitting: Gynecology

## 2012-03-12 DIAGNOSIS — R928 Other abnormal and inconclusive findings on diagnostic imaging of breast: Secondary | ICD-10-CM

## 2012-03-13 ENCOUNTER — Other Ambulatory Visit: Payer: Self-pay | Admitting: Gynecology

## 2012-03-13 DIAGNOSIS — C50911 Malignant neoplasm of unspecified site of right female breast: Secondary | ICD-10-CM

## 2012-03-15 ENCOUNTER — Encounter (INDEPENDENT_AMBULATORY_CARE_PROVIDER_SITE_OTHER): Payer: Self-pay | Admitting: Surgery

## 2012-03-15 ENCOUNTER — Ambulatory Visit (INDEPENDENT_AMBULATORY_CARE_PROVIDER_SITE_OTHER): Payer: BC Managed Care – PPO | Admitting: Surgery

## 2012-03-15 VITALS — BP 142/80 | HR 72 | Temp 97.2°F | Resp 16 | Ht 62.5 in | Wt 266.4 lb

## 2012-03-15 DIAGNOSIS — C50919 Malignant neoplasm of unspecified site of unspecified female breast: Secondary | ICD-10-CM

## 2012-03-15 DIAGNOSIS — C50911 Malignant neoplasm of unspecified site of right female breast: Secondary | ICD-10-CM

## 2012-03-15 NOTE — Patient Instructions (Signed)
I will plan to call you on Wednesday, after the report of the MRI is available. At that time we can make definite plans for surgery.

## 2012-03-15 NOTE — Progress Notes (Signed)
Patient ID: Krystal Watson, female   DOB: 1946-03-22, 66 y.o.   MRN: [id number]  Chief Complaint  Patient presents with  . Breast Cancer    Right breast    HPI Krystal Watson is a 66 y.o. female.  She recently had a mammogram done in abnormality was found. 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 Prognostic panel is pending. MRI is scheduled for next week.  The patient has no prior breast symptoms or breast problems. She had a sister who had breast cancer. Father had colon cancer. No other cancers known in the family. HPI  Past Medical History  Diagnosis Date  . Diabetes mellitus   . Diverticulosis   . Gallstones   . Hyperlipidemia   . Hypertension   . Obesity   . GERD (gastroesophageal reflux disease)   . Cancer     breast    Past Surgical History  Procedure Date  . Cholecystectomy   . Abdominal hysterectomy     Family History  Problem Relation Age of Onset  . Breast cancer Sister   . Cancer Sister     breast  . Colon cancer Father 61  . Cancer Father     colon  . Diabetes Mother   . Heart disease Mother   . Esophageal cancer Neg Hx   . Stomach cancer Neg Hx   . Rectal cancer Neg Hx     Social History History  Substance Use Topics  . Smoking status: Never Smoker   . Smokeless tobacco: Never Used  . Alcohol Use: Yes     Comment: rare    Allergies  Allergen Reactions  . Sulfonamide Derivatives     Pt can not remember the reaction    Current Outpatient Prescriptions  Medication Sig Dispense Refill  . aspirin 81 MG tablet Take 81 mg by mouth daily.      [name] atorvastatin (LIPITOR) 40 MG tablet Take 40 mg by mouth daily.      . Calcium Carbonate-Vitamin D (CALTRATE 600+D) 600-400 MG-UNIT per tablet Take 1 tablet by mouth daily.      [name] CINNAMON PO Take 2 capsules by mouth daily.      [name] DILTIAZEM HCL ER BEADS PO Take 1 tablet by mouth daily.      . hydrochlorothiazide (HYDRODIURIL) 25 MG  tablet Take 25 mg by mouth daily.      . metFORMIN (GLUCOPHAGE) 500 MG tablet Take 500 mg by mouth daily.      . Multiple Vitamins-Minerals (ONE-A-DAY WEIGHT SMART ADVANCE) TABS Take 1 tablet by mouth daily.      . pioglitazone (ACTOS) 30 MG tablet Take 30 mg by mouth daily.      . Probiotic Product (ALIGN) 4 MG CAPS Take 1 capsule by mouth daily.  14 capsule  0  . ramipril (ALTACE) 10 MG tablet Take 10 mg by mouth daily.      . Alum Hydroxide-Mag Carbonate (GAVISCON PO) Take by mouth as directed.        Review of Systems Review of Systems  Constitutional: Negative for fever, chills and unexpected weight change.  HENT: Negative for hearing loss, congestion, sore throat, trouble swallowing and voice change.   Eyes: Negative for visual disturbance.  Respiratory: Negative for cough and wheezing.   Cardiovascular: Negative for chest pain, palpitations and leg swelling.  Gastrointestinal: Positive for abdominal pain. Negative for nausea, vomiting, diarrhea, constipation, blood in stool, abdominal distention  and anal bleeding.       Chronic issues with right upper quadrant pain. She is status post a open cholecystectomy in the late 80s. She's had 2 ERCPs, no specific etiology has been determined for her chronic pain.  Genitourinary: Negative for hematuria, vaginal bleeding and difficulty urinating.  Musculoskeletal: Negative for arthralgias.  Skin: Negative for rash and wound.  Neurological: Negative for seizures, syncope and headaches.  Hematological: Negative for adenopathy. Bruises/bleeds easily.  Psychiatric/Behavioral: Negative for confusion.    Blood pressure 142/80, pulse 72, temperature 97.2 F (36.2 C), temperature source Temporal, resp. rate 16, height 5' 2.5" (1.588 m), weight 266 lb 6.4 oz (120.838 kg).  Physical Exam Physical Exam  Vitals reviewed. Constitutional: She is oriented to person, place, and time. She appears well-developed and well-nourished. No distress.  HENT:   Head: Normocephalic and atraumatic.  Mouth/Throat: Oropharynx is clear and moist.  Eyes: Conjunctivae normal and EOM are normal. Pupils are equal, round, and reactive to light. No scleral icterus.  Neck: Normal range of motion. Neck supple. No tracheal deviation present. No thyromegaly present.  Cardiovascular: Normal rate, regular rhythm, normal heart sounds and intact distal pulses.  Exam reveals no gallop and no friction rub.   No murmur heard. Pulmonary/Chest: Effort normal and breath sounds normal. No respiratory distress. She has no wheezes. She has no rales.  Abdominal: Soft. Bowel sounds are normal. She exhibits no distension and no mass. There is no tenderness. There is no rebound and no guarding.  Musculoskeletal: Normal range of motion. She exhibits no edema and no tenderness.  Neurological: She is alert and oriented to person, place, and time.  Skin: Skin is warm and dry. No rash noted. She is not diaphoretic. No erythema.  Psychiatric: She has a normal mood and affect. Her behavior is normal. Judgment and thought content normal.    Data Reviewed I have reviewed the mammogram reports as well as the pathology reports.  Assessment    Clinical stage 0 breast cancer, right breast, lower outer quadrant    Plan    I have explained the pathophysiology and staging of breast cancer with particular attention to her exact situation. We discussed the multidisciplinary approach to breast cancer which often includes both medical and radiation oncology consultations.  We also discussed surgical options for the treatment of breast cancer including lumpectomy and mastectomy with possible reconstructive surgery. In addition we talked about the evaluation and management of lymph nodes including a description of sentinel lymph node biopsy and axillary dissections. We reviewed potential complications and risks including bleeding, infection, numbness,  lymphedema, and the potential need for  additional surgery.  She understands that for patients who are candidate for lumpectomy or mastectomy there is an equal survival rate with either technique, but a slightly higher local recurrence rate with lumpectomy. In addition she knows that a lumpectomy usually requires postoperative radiation as part of the management of the breast cancer.  We have discussed the likely postoperative course and plans for followup.  I have given the patient some written information that reviewed all of these issues. I believe her questions are answered and that she has a good understanding of the issues.  Pending the report of the MRI, she is an excellent candidate for a NL lumpectomy and post op radiation. I have discussed the indications for the lumpectomy and described the procedure. She understand that the chance of removal of the abnormal area is very good, but that occasionally we are unable to locate  it and may have to do a second procedure. We also discussed the possibility of a second procedure to get additional tissue. Risks of surgery such as bleeding and infection have also been explained, as well as the implications of not doing the surgery. I will call her after the MRT to make final plans        Jacquese Cassarino J 03/15/2012, 10:04 AM

## 2012-03-19 ENCOUNTER — Ambulatory Visit
Admission: RE | Admit: 2012-03-19 | Discharge: 2012-03-19 | Disposition: A | Discharge status: Home / Self Care | Payer: BC Managed Care – PPO | Source: Ambulatory Visit | Attending: Gynecology | Admitting: Gynecology

## 2012-03-19 DIAGNOSIS — C50911 Malignant neoplasm of unspecified site of right female breast: Secondary | ICD-10-CM

## 2012-03-19 MED ORDER — GADOBENATE DIMEGLUMINE 529 MG/ML IV SOLN
20.0000 mL | Freq: Once | INTRAVENOUS | Status: AC | PRN
  Administered 2012-03-19: 20 mL via INTRAVENOUS

## 2012-03-20 ENCOUNTER — Other Ambulatory Visit: Payer: Self-pay | Admitting: Gynecology

## 2012-03-20 ENCOUNTER — Telehealth (INDEPENDENT_AMBULATORY_CARE_PROVIDER_SITE_OTHER): Payer: Self-pay | Admitting: Surgery

## 2012-03-20 DIAGNOSIS — R928 Other abnormal and inconclusive findings on diagnostic imaging of breast: Secondary | ICD-10-CM

## 2012-03-20 NOTE — Telephone Encounter (Signed)
Spoke with her about MRI. She has already scheduled biopsies

## 2012-03-25 ENCOUNTER — Ambulatory Visit
Admission: RE | Admit: 2012-03-25 | Discharge: 2012-03-25 | Disposition: A | Discharge status: Home / Self Care | Payer: BC Managed Care – PPO | Source: Ambulatory Visit | Attending: Gynecology | Admitting: Gynecology

## 2012-03-25 DIAGNOSIS — R928 Other abnormal and inconclusive findings on diagnostic imaging of breast: Secondary | ICD-10-CM

## 2012-03-25 MED ORDER — GADOBENATE DIMEGLUMINE 529 MG/ML IV SOLN
20.0000 mL | Freq: Once | INTRAVENOUS | Status: AC | PRN
  Administered 2012-03-25: 20 mL via INTRAVENOUS

## 2012-03-29 ENCOUNTER — Telehealth (INDEPENDENT_AMBULATORY_CARE_PROVIDER_SITE_OTHER): Payer: Self-pay | Admitting: General Surgery

## 2012-03-29 NOTE — Telephone Encounter (Signed)
Patient called status post having MR biopsies. She is aware both came back positive. She would like to speak with Dr Jamey Ripa again about surgery. I offered appt on 04/05/2012. She would like a phone call instead if possible so she can get surgery set up asap. I advised patient I would send a message to Dr Jamey Ripa.

## 2012-03-30 ENCOUNTER — Other Ambulatory Visit (INDEPENDENT_AMBULATORY_CARE_PROVIDER_SITE_OTHER): Payer: Self-pay | Admitting: Surgery

## 2012-03-30 ENCOUNTER — Telehealth (INDEPENDENT_AMBULATORY_CARE_PROVIDER_SITE_OTHER): Payer: Self-pay | Admitting: Surgery

## 2012-03-30 DIAGNOSIS — C50919 Malignant neoplasm of unspecified site of unspecified female breast: Secondary | ICD-10-CM

## 2012-03-30 NOTE — Telephone Encounter (Signed)
I reviewed the recent path report which shows DCIS in the contralateral breast. She is now interested in bilateral mastectomy, which I think is the appropriate choice. Discussed immediate reconstruction and she would like to talk to a plastic surgeon to see what is feasible

## 2012-04-01 ENCOUNTER — Telehealth (INDEPENDENT_AMBULATORY_CARE_PROVIDER_SITE_OTHER): Payer: Self-pay | Admitting: General Surgery

## 2012-04-01 DIAGNOSIS — C50919 Malignant neoplasm of unspecified site of unspecified female breast: Secondary | ICD-10-CM

## 2012-04-01 NOTE — Telephone Encounter (Signed)
Message copied by Liliana Cline on Mon Apr 01, 2012  8:47 AM ------      Message from: Currie Paris      Created: Sat Mar 30, 2012 10:13 AM       I discussed with her she will need bilateral mastectomy and bilateral sentinel nodes. She should see a Engineer, petroleum for possible immediate reconstruction and either Odis Luster or Snger are ok with her, so try to get her an appointment withone of them. I will put orders in so she will be ready to go after they see her

## 2012-04-01 NOTE — Telephone Encounter (Signed)
Spoke with patient- made referral to Dr Odis Luster and appt scheduled 04/08/12 - patient aware. Patient had questions about staging - she is aware this happens after her final pathology report is back. She asked if this was all still in the ducts. I made her aware it looks that way on the pathology reports we have but we will not know for sure until we have a final pathology. She will keep appt with Dr Odis Luster and we will schedule her after. She will call back with any other questions.

## 2012-04-09 ENCOUNTER — Other Ambulatory Visit (INDEPENDENT_AMBULATORY_CARE_PROVIDER_SITE_OTHER): Payer: Self-pay | Admitting: Surgery

## 2012-04-09 ENCOUNTER — Telehealth (INDEPENDENT_AMBULATORY_CARE_PROVIDER_SITE_OTHER): Payer: Self-pay | Admitting: General Surgery

## 2012-04-09 DIAGNOSIS — D0590 Unspecified type of carcinoma in situ of unspecified breast: Secondary | ICD-10-CM

## 2012-04-09 NOTE — Telephone Encounter (Signed)
Krystal Watson called to inform Dr. Jamey Ripa that she met with the plastic surgeon yesterday( Dr. Odis Luster) and has decided not to have plastic surgery at this time, he suggested she lose some weight first. What is the next step to scheduling surgery?/ [phone]//[phone]

## 2012-04-09 NOTE — Telephone Encounter (Signed)
Spoke with patient and made aware orders have been sent to surgery scheduling. She will await their call.

## 2012-04-10 ENCOUNTER — Telehealth (INDEPENDENT_AMBULATORY_CARE_PROVIDER_SITE_OTHER): Payer: Self-pay

## 2012-04-10 NOTE — Telephone Encounter (Signed)
The pt called stating her breasts feel hard.  The left one is worse than the right and it feels larger.  She is scheduled for surgery 1/22.  I wasn't sure if this could be hematoma or anything resulting from the biopsy so I told her I would let Dr Jamey Ripa know.  She otherwise feels fine.

## 2012-04-10 NOTE — Telephone Encounter (Signed)
Spoke with patient. Appt made for next Wednesday 04/17/2012.

## 2012-04-10 NOTE — Telephone Encounter (Signed)
It sounds like it may be from biopsy but we can check it in the office

## 2012-04-17 ENCOUNTER — Encounter (INDEPENDENT_AMBULATORY_CARE_PROVIDER_SITE_OTHER): Payer: Self-pay | Admitting: Surgery

## 2012-04-17 ENCOUNTER — Ambulatory Visit (INDEPENDENT_AMBULATORY_CARE_PROVIDER_SITE_OTHER): Payer: BC Managed Care – PPO | Admitting: Surgery

## 2012-04-17 VITALS — BP 138/84 | HR 84 | Temp 97.6°F | Resp 16 | Ht 62.5 in | Wt 267.2 lb

## 2012-04-17 DIAGNOSIS — N837 Hematoma of broad ligament: Secondary | ICD-10-CM

## 2012-04-17 NOTE — Progress Notes (Signed)
Chief complaint: Hard area in reolar area of both breasts  History of present illness: This patient has bilateral DCIS and has been scheduled for bilateral mastectomies. She is declined any reconstruction at this time because she needs to lose weight. She is noticed a very hard area in the lateral areolar margin bilaterally. They seem to be getting a little bit better. She's concerned about these and came in for a checkup to be sure there were no issues that would interfere with the planned surgery next week.  Exam: Vital signs:BP 138/84  Pulse 84  Temp 97.6 F (36.4 C)  Resp 16  Ht 5' 2.5" (1.588 m)  Wt 267 lb 3.2 oz (121.201 kg)  BMI 48.09 kg/m2  General: The patient alert oriented healthy-appearing Breasts: Symmetric. There is about a 3 cm hard area that is at the areolar margin bilaterally. I think this is consistent with resolving hematoma or some fat necrosis from her biopsies. These were not present prior to the biopsy, and my seeing her.  Impression: Small bilateral traumatic hematoma secondary to needle core biopsies  Plan: Reassured the patient that we could proceed with surgery as scheduled. Reviewed the plans and answered questions.[name]

## 2012-04-17 NOTE — Patient Instructions (Signed)
I will see you next Wednesday for your surgery

## 2012-04-18 ENCOUNTER — Encounter (HOSPITAL_COMMUNITY): Payer: Self-pay | Admitting: Pharmacy Technician

## 2012-04-18 ENCOUNTER — Encounter (HOSPITAL_COMMUNITY): Admission: RE | Admit: 2012-04-18 | Payer: BC Managed Care – PPO | Source: Ambulatory Visit

## 2012-04-18 ENCOUNTER — Encounter (HOSPITAL_COMMUNITY): Payer: Self-pay

## 2012-04-18 ENCOUNTER — Encounter (HOSPITAL_COMMUNITY)
Admission: RE | Admit: 2012-04-18 | Discharge: 2012-04-18 | Disposition: A | Discharge status: Home / Self Care | Payer: BC Managed Care – PPO | Source: Ambulatory Visit | Attending: Surgery | Admitting: Surgery

## 2012-04-18 HISTORY — DX: Shortness of breath: R06.02

## 2012-04-18 HISTORY — DX: Unspecified osteoarthritis, unspecified site: M19.90

## 2012-04-18 LAB — URINALYSIS, ROUTINE W REFLEX MICROSCOPIC
Bilirubin Urine: NEGATIVE
Hgb urine dipstick: NEGATIVE
Nitrite: POSITIVE — AB
Protein, ur: NEGATIVE mg/dL
Specific Gravity, Urine: 1.02 (ref 1.005–1.030)
Urobilinogen, UA: 1 mg/dL (ref 0.0–1.0)

## 2012-04-18 LAB — CBC WITH DIFFERENTIAL/PLATELET
Eosinophils Relative: 1 % (ref 0–5)
HCT: 41.9 % (ref 36.0–46.0)
Hemoglobin: 14 g/dL (ref 12.0–15.0)
Lymphocytes Relative: 35 % (ref 12–46)
Lymphs Abs: 2.5 10*3/uL (ref 0.7–4.0)
MCV: 97 fL (ref 78.0–100.0)
Monocytes Absolute: 0.9 10*3/uL (ref 0.1–1.0)
Neutro Abs: 3.8 10*3/uL (ref 1.7–7.7)
RBC: 4.32 MIL/uL (ref 3.87–5.11)
WBC: 7.3 10*3/uL (ref 4.0–10.5)

## 2012-04-18 LAB — COMPREHENSIVE METABOLIC PANEL
ALT: 19 U/L (ref 0–35)
AST: 32 U/L (ref 0–37)
CO2: 28 mEq/L (ref 19–32)
Calcium: 10 mg/dL (ref 8.4–10.5)
Chloride: 101 mEq/L (ref 96–112)
Creatinine, Ser: 0.58 mg/dL (ref 0.50–1.10)
GFR calc Af Amer: >90 mL/min (ref >90)
GFR calc non Af Amer: >90 mL/min (ref >90)
Glucose, Bld: 92 mg/dL (ref 70–99)
Total Bilirubin: 0.4 mg/dL (ref 0.3–1.2)

## 2012-04-18 LAB — SURGICAL PCR SCREEN: Staphylococcus aureus: NEGATIVE

## 2012-04-18 LAB — URINE MICROSCOPIC-ADD ON

## 2012-04-18 MED ORDER — CEFAZOLIN SODIUM-DEXTROSE 2-3 GM-% IV SOLR: 2.0000 g | INTRAVENOUS | Status: DC

## 2012-04-18 MED ORDER — CHLORHEXIDINE GLUCONATE 4 % EX LIQD: 1.0000 "application " | Freq: Once | CUTANEOUS | Status: DC

## 2012-04-18 NOTE — Progress Notes (Signed)
04/18/12 1718  OBSTRUCTIVE SLEEP APNEA  Have you ever been diagnosed with sleep apnea through a sleep study? No  Do you snore loudly (loud enough to be heard through closed doors)?  1  Do you often feel tired, fatigued, or sleepy during the daytime? 1  Has anyone observed you stop breathing during your sleep? 0  Do you have, or are you being treated for high blood pressure? 1  BMI more than 35 kg/m2? 1  Age over 65 years old? 1  Neck circumference greater than 40 cm/18 inches? 0  Gender: 0  Obstructive Sleep Apnea Score 5

## 2012-04-18 NOTE — Pre-Procedure Instructions (Addendum)
Krystal Watson  04/18/2012   Your procedure is scheduled on:  Wednesday, January 22 nd.  Report to [facility] at 6:30AM.   Call this number if you have problems the morning of surgery: [phone]   Remember:   Do not eat food or drink liquids after midnight.    Take these medicines the morning of surgery with A SIP OF WATER: Dilitazem HCL ER Beads.  Bring Albuterol inhaler to the hospital.    Do not wear jewelry, make-up or nail polish.  Do not wear lotions, powders, or perfumes. You may wear deodorant.  Do not shave 48 hours prior to surgery. Men may shave face and neck.  Do not bring valuables to the hospital.  Contacts, dentures or bridgework may not be worn into surgery.  Leave suitcase in the car. After surgery it may be brought to your room.  For patients admitted to the hospital, checkout time is 11:00 AM the day of  discharge.   Patients discharged the day of surgery will not be allowed to drive  Home.  Special Instructions:   Shower using CHG 2 nights before surgery and the night before surgery.  If you shower the day of surgery use CHG.  Use special wash - you have one bottle of CHG for all showers.  You should use approximately 1/3 of the bottle for each shower.   Please read over the following fact sheets that you were given: Pain Booklet, Coughing and Deep Breathing and Surgical Site Infection Prevention

## 2012-04-19 NOTE — Consult Note (Signed)
Anesthesia Chart Review:  Patient is a 67 year old female scheduled for bilateral mastectomy with SN removal on 04/24/12 by Dr. Jamey Ripa.  History includes non-smoker, DM2, HTN, HLD, GERD, morbid obesity, arthritis, diverticulosis.  PCP is listed as Dr. Renford Dills.  EKG on 04/18/12 showed NSr, moderate voltage criteria for LVH, non-specific T wave abnormality.  Her last stress test was in 2005 and showed a fixed inferior wall defect felt most likely related to diaphragmatic attenuation, no evidence of LV myocardial ischemia, normal LVEF 64%.  CXR on 04/18/12 showed linear scarring, no acute abnormalities.  Preoperative labs noted.  If no acute change in her status then anticipate she can proceed as planned.  She will be evaluated on the day of surgery by her assigned anesthesiologist.  Shonna Chock, PA-C 04/19/02 1508

## 2012-04-23 ENCOUNTER — Telehealth (INDEPENDENT_AMBULATORY_CARE_PROVIDER_SITE_OTHER): Payer: Self-pay | Admitting: General Surgery

## 2012-04-23 MED ORDER — DEXTROSE 5 % IV SOLN
3.0000 g | INTRAVENOUS | Status: AC
  Administered 2012-04-24: 3 g via INTRAVENOUS
  Filled 2012-04-23: qty 3000

## 2012-04-23 NOTE — Telephone Encounter (Signed)
Pt called for Rx to be FAXd to [facility] [place] for her camisoles; has surgery this week for [employer].

## 2012-04-24 ENCOUNTER — Ambulatory Visit (HOSPITAL_COMMUNITY): Payer: BC Managed Care – PPO | Admitting: Vascular Surgery

## 2012-04-24 ENCOUNTER — Encounter (HOSPITAL_COMMUNITY): Payer: Self-pay | Admitting: Vascular Surgery

## 2012-04-24 ENCOUNTER — Observation Stay (HOSPITAL_COMMUNITY)
Admission: RE | Admit: 2012-04-24 | Discharge: 2012-04-25 | Disposition: A | Discharge status: Home / Self Care | Payer: BC Managed Care – PPO | Source: Ambulatory Visit | Attending: Surgery | Admitting: Surgery

## 2012-04-24 ENCOUNTER — Encounter (HOSPITAL_COMMUNITY)
Admission: RE | Admit: 2012-04-24 | Discharge: 2012-04-24 | Disposition: A | Discharge status: Home / Self Care | Payer: BC Managed Care – PPO | Source: Ambulatory Visit | Attending: Surgery | Admitting: Surgery

## 2012-04-24 ENCOUNTER — Encounter (HOSPITAL_COMMUNITY)
Admission: RE | Disposition: A | Discharge status: Home / Self Care | Payer: Self-pay | Source: Ambulatory Visit | Attending: Surgery

## 2012-04-24 ENCOUNTER — Encounter (HOSPITAL_COMMUNITY): Payer: Self-pay | Admitting: *Deleted

## 2012-04-24 DIAGNOSIS — D0590 Unspecified type of carcinoma in situ of unspecified breast: Secondary | ICD-10-CM | POA: Insufficient documentation

## 2012-04-24 DIAGNOSIS — R142 Eructation: Secondary | ICD-10-CM

## 2012-04-24 DIAGNOSIS — Z01812 Encounter for preprocedural laboratory examination: Secondary | ICD-10-CM | POA: Insufficient documentation

## 2012-04-24 DIAGNOSIS — C50911 Malignant neoplasm of unspecified site of right female breast: Secondary | ICD-10-CM

## 2012-04-24 DIAGNOSIS — Z01818 Encounter for other preprocedural examination: Secondary | ICD-10-CM | POA: Insufficient documentation

## 2012-04-24 DIAGNOSIS — Z01810 Encounter for preprocedural cardiovascular examination: Secondary | ICD-10-CM | POA: Insufficient documentation

## 2012-04-24 DIAGNOSIS — E119 Type 2 diabetes mellitus without complications: Secondary | ICD-10-CM | POA: Insufficient documentation

## 2012-04-24 DIAGNOSIS — R141 Gas pain: Secondary | ICD-10-CM

## 2012-04-24 HISTORY — PX: SIMPLE MASTECTOMY WITH AXILLARY SENTINEL NODE BIOPSY: SHX6098

## 2012-04-24 LAB — GLUCOSE, CAPILLARY: Glucose-Capillary: 100 mg/dL — ABNORMAL HIGH (ref 70–99)

## 2012-04-24 SURGERY — SIMPLE MASTECTOMY WITH AXILLARY SENTINEL NODE BIOPSY
Anesthesia: General | Site: Breast | Laterality: Bilateral | Wound class: Clean

## 2012-04-24 MED ORDER — 0.9 % SODIUM CHLORIDE (POUR BTL) OPTIME
TOPICAL | Status: DC | PRN
  Administered 2012-04-24: 1000 mL

## 2012-04-24 MED ORDER — SODIUM CHLORIDE 0.9 % IR SOLN
Status: DC | PRN
  Administered 2012-04-24: 09:00:00

## 2012-04-24 MED ORDER — HYDROMORPHONE HCL PF 1 MG/ML IJ SOLN
0.2500 mg | INTRAMUSCULAR | Status: DC | PRN
  Administered 2012-04-24 (×2): 0.5 mg via INTRAVENOUS

## 2012-04-24 MED ORDER — HEPARIN SODIUM (PORCINE) 5000 UNIT/ML IJ SOLN
5000.0000 [IU] | Freq: Three times a day (TID) | INTRAMUSCULAR | Status: DC
  Filled 2012-04-24 (×4): qty 1

## 2012-04-24 MED ORDER — HYDROMORPHONE HCL PF 1 MG/ML IJ SOLN
INTRAMUSCULAR | Status: AC
  Administered 2012-04-24: 0.5 mg via INTRAVENOUS
  Filled 2012-04-24: qty 1

## 2012-04-24 MED ORDER — METFORMIN HCL 500 MG PO TABS
500.0000 mg | ORAL_TABLET | Freq: Two times a day (BID) | ORAL | Status: DC
  Administered 2012-04-24 – 2012-04-25 (×2): 500 mg via ORAL
  Filled 2012-04-24 (×4): qty 1

## 2012-04-24 MED ORDER — SUCCINYLCHOLINE CHLORIDE 20 MG/ML IJ SOLN
INTRAMUSCULAR | Status: DC | PRN
  Administered 2012-04-24: 100 mg via INTRAVENOUS

## 2012-04-24 MED ORDER — BUPIVACAINE HCL 0.25 % IJ SOLN
INTRAMUSCULAR | Status: DC | PRN
  Administered 2012-04-24: 80 mL

## 2012-04-24 MED ORDER — MIDAZOLAM HCL 5 MG/5ML IJ SOLN
INTRAMUSCULAR | Status: DC | PRN
  Administered 2012-04-24 (×2): 2 mg via INTRAVENOUS

## 2012-04-24 MED ORDER — TECHNETIUM TC 99M SULFUR COLLOID FILTERED
1.0000 | Freq: Once | INTRAVENOUS | Status: AC | PRN
  Administered 2012-04-24: 1 via INTRADERMAL

## 2012-04-24 MED ORDER — BUPIVACAINE HCL (PF) 0.25 % IJ SOLN
INTRAMUSCULAR | Status: AC
  Filled 2012-04-24: qty 90

## 2012-04-24 MED ORDER — ONDANSETRON HCL 4 MG PO TABS: 4.0000 mg | ORAL_TABLET | Freq: Four times a day (QID) | ORAL | Status: DC | PRN

## 2012-04-24 MED ORDER — ALBUTEROL SULFATE HFA 108 (90 BASE) MCG/ACT IN AERS
2.0000 | INHALATION_SPRAY | Freq: Four times a day (QID) | RESPIRATORY_TRACT | Status: DC | PRN
  Filled 2012-04-24: qty 6.7

## 2012-04-24 MED ORDER — CEFAZOLIN SODIUM 1-5 GM-% IV SOLN
1.0000 g | Freq: Three times a day (TID) | INTRAVENOUS | Status: AC
  Administered 2012-04-24: 1 g via INTRAVENOUS
  Filled 2012-04-24: qty 50

## 2012-04-24 MED ORDER — SODIUM CHLORIDE 0.9 % IJ SOLN
INTRAMUSCULAR | Status: DC | PRN
  Administered 2012-04-24: 10:00:00 via INTRAMUSCULAR

## 2012-04-24 MED ORDER — PROPOFOL 10 MG/ML IV BOLUS
INTRAVENOUS | Status: DC | PRN
  Administered 2012-04-24: 50 mg via INTRAVENOUS
  Administered 2012-04-24: 150 mg via INTRAVENOUS

## 2012-04-24 MED ORDER — DILTIAZEM HCL ER 240 MG PO CP24
240.0000 mg | ORAL_CAPSULE | Freq: Every day | ORAL | Status: DC
  Filled 2012-04-24: qty 1

## 2012-04-24 MED ORDER — PHENYLEPHRINE HCL 10 MG/ML IJ SOLN
INTRAMUSCULAR | Status: DC | PRN
  Administered 2012-04-24: 40 ug via INTRAVENOUS
  Administered 2012-04-24: 80 ug via INTRAVENOUS
  Administered 2012-04-24: 40 ug via INTRAVENOUS

## 2012-04-24 MED ORDER — ONDANSETRON HCL 4 MG/2ML IJ SOLN
INTRAMUSCULAR | Status: DC | PRN
  Administered 2012-04-24: 4 mg via INTRAVENOUS

## 2012-04-24 MED ORDER — RAMIPRIL 10 MG PO TABS: 10.0000 mg | ORAL_TABLET | Freq: Every day | ORAL | Status: DC

## 2012-04-24 MED ORDER — OXYCODONE-ACETAMINOPHEN 5-325 MG PO TABS
1.0000 | ORAL_TABLET | ORAL | Status: DC | PRN
  Administered 2012-04-24 – 2012-04-25 (×3): 2 via ORAL
  Filled 2012-04-24 (×2): qty 2

## 2012-04-24 MED ORDER — KCL IN DEXTROSE-NACL 20-5-0.45 MEQ/L-%-% IV SOLN
INTRAVENOUS | Status: AC
  Filled 2012-04-24: qty 1000

## 2012-04-24 MED ORDER — FENTANYL CITRATE 0.05 MG/ML IJ SOLN
INTRAMUSCULAR | Status: DC | PRN
  Administered 2012-04-24: 100 ug via INTRAVENOUS
  Administered 2012-04-24: 25 ug via INTRAVENOUS
  Administered 2012-04-24: 50 ug via INTRAVENOUS
  Administered 2012-04-24: 25 ug via INTRAVENOUS
  Administered 2012-04-24 (×2): 50 ug via INTRAVENOUS
  Administered 2012-04-24: 100 ug via INTRAVENOUS

## 2012-04-24 MED ORDER — EPHEDRINE SULFATE 50 MG/ML IJ SOLN
INTRAMUSCULAR | Status: DC | PRN
  Administered 2012-04-24: 10 mg via INTRAVENOUS

## 2012-04-24 MED ORDER — CHLORHEXIDINE GLUCONATE 4 % EX LIQD: 1.0000 "application " | Freq: Once | CUTANEOUS | Status: DC

## 2012-04-24 MED ORDER — KCL IN DEXTROSE-NACL 20-5-0.9 MEQ/L-%-% IV SOLN
INTRAVENOUS | Status: DC
  Filled 2012-04-24 (×2): qty 1000

## 2012-04-24 MED ORDER — OXYCODONE-ACETAMINOPHEN 5-325 MG PO TABS
ORAL_TABLET | ORAL | Status: AC
  Filled 2012-04-24: qty 2

## 2012-04-24 MED ORDER — RAMIPRIL 10 MG PO CAPS
10.0000 mg | ORAL_CAPSULE | Freq: Every day | ORAL | Status: DC
  Filled 2012-04-24 (×2): qty 1

## 2012-04-24 MED ORDER — ONDANSETRON HCL 4 MG/2ML IJ SOLN: 4.0000 mg | Freq: Four times a day (QID) | INTRAMUSCULAR | Status: DC | PRN

## 2012-04-24 MED ORDER — ATORVASTATIN CALCIUM 40 MG PO TABS
40.0000 mg | ORAL_TABLET | Freq: Every day | ORAL | Status: DC
  Administered 2012-04-24: 40 mg via ORAL
  Filled 2012-04-24 (×2): qty 1

## 2012-04-24 MED ORDER — HYDROMORPHONE HCL PF 1 MG/ML IJ SOLN: 0.2500 mg | INTRAMUSCULAR | Status: DC | PRN

## 2012-04-24 MED ORDER — METHYLENE BLUE 1 % INJ SOLN
INTRAMUSCULAR | Status: AC
  Filled 2012-04-24: qty 10

## 2012-04-24 MED ORDER — ARTIFICIAL TEARS OP OINT
TOPICAL_OINTMENT | OPHTHALMIC | Status: DC | PRN
  Administered 2012-04-24: 1 via OPHTHALMIC

## 2012-04-24 MED ORDER — MORPHINE SULFATE 2 MG/ML IJ SOLN: 2.0000 mg | INTRAMUSCULAR | Status: DC | PRN

## 2012-04-24 MED ORDER — PIOGLITAZONE HCL 30 MG PO TABS
30.0000 mg | ORAL_TABLET | Freq: Every day | ORAL | Status: DC
  Administered 2012-04-24: 30 mg via ORAL
  Filled 2012-04-24 (×2): qty 1

## 2012-04-24 MED ORDER — SODIUM CHLORIDE 0.9 % IJ SOLN
INTRAMUSCULAR | Status: DC | PRN
  Administered 2012-04-24 (×16): 10 mL via INTRAVENOUS

## 2012-04-24 MED ORDER — LIDOCAINE HCL (CARDIAC) 20 MG/ML IV SOLN
INTRAVENOUS | Status: DC | PRN
  Administered 2012-04-24: 50 mg via INTRAVENOUS

## 2012-04-24 MED ORDER — HYDROCHLOROTHIAZIDE 25 MG PO TABS
25.0000 mg | ORAL_TABLET | Freq: Every day | ORAL | Status: DC
  Filled 2012-04-24 (×2): qty 1

## 2012-04-24 MED ORDER — LACTATED RINGERS IV SOLN
INTRAVENOUS | Status: DC | PRN
  Administered 2012-04-24 (×2): via INTRAVENOUS

## 2012-04-24 SURGICAL SUPPLY — 57 items
ADH SKN CLS APL DERMABOND .7 (GAUZE/BANDAGES/DRESSINGS) ×4
APPLIER CLIP 9.375 MED OPEN (MISCELLANEOUS) ×2
APPLIER CLIP 9.375 SM OPEN (CLIP)
APR CLP MED 9.3 20 MLT OPN (MISCELLANEOUS) ×1
APR CLP SM 9.3 20 MLT OPN (CLIP)
BINDER BREAST LRG (GAUZE/BANDAGES/DRESSINGS) IMPLANT
BINDER BREAST XLRG (GAUZE/BANDAGES/DRESSINGS) ×2 IMPLANT
BIOPATCH RED 1 DISK 7.0 (GAUZE/BANDAGES/DRESSINGS) ×4 IMPLANT
CANISTER SUCTION 2500CC (MISCELLANEOUS) ×2 IMPLANT
CHLORAPREP W/TINT 26ML (MISCELLANEOUS) ×4 IMPLANT
CLIP APPLIE 9.375 MED OPEN (MISCELLANEOUS) ×1 IMPLANT
CLIP APPLIE 9.375 SM OPEN (CLIP) IMPLANT
CLOTH BEACON ORANGE TIMEOUT ST (SAFETY) ×2 IMPLANT
CONT SPEC 4OZ CLIKSEAL STRL BL (MISCELLANEOUS) ×4 IMPLANT
COVER PROBE W GEL 5X96 (DRAPES) ×2 IMPLANT
COVER SURGICAL LIGHT HANDLE (MISCELLANEOUS) ×2 IMPLANT
DERMABOND ADVANCED (GAUZE/BANDAGES/DRESSINGS) ×4
DERMABOND ADVANCED .7 DNX12 (GAUZE/BANDAGES/DRESSINGS) ×4 IMPLANT
DRAIN CHANNEL 19F RND (DRAIN) ×2 IMPLANT
DRAPE CHEST BREAST 15X10 FENES (DRAPES) IMPLANT
DRAPE ORTHO SPLIT 77X108 STRL (DRAPES) ×4
DRAPE SURG 17X23 STRL (DRAPES) ×8 IMPLANT
DRAPE SURG ORHT 6 SPLT 77X108 (DRAPES) ×2 IMPLANT
DRAPE UTILITY 15X26 W/TAPE STR (DRAPE) ×4 IMPLANT
DRSG PAD ABDOMINAL 8X10 ST (GAUZE/BANDAGES/DRESSINGS) ×4 IMPLANT
ELECT BLADE 4.0 EZ CLEAN MEGAD (MISCELLANEOUS) ×2
ELECT CAUTERY BLADE 6.4 (BLADE) ×2 IMPLANT
ELECT REM PT RETURN 9FT ADLT (ELECTROSURGICAL) ×2
ELECTRODE BLDE 4.0 EZ CLN MEGD (MISCELLANEOUS) ×1 IMPLANT
ELECTRODE REM PT RTRN 9FT ADLT (ELECTROSURGICAL) ×1 IMPLANT
EVACUATOR SILICONE 100CC (DRAIN) ×4 IMPLANT
GLOVE BIOGEL PI IND STRL 7.0 (GLOVE) ×1 IMPLANT
GLOVE BIOGEL PI INDICATOR 7.0 (GLOVE) ×1
GLOVE EUDERMIC 7 POWDERFREE (GLOVE) ×4 IMPLANT
GLOVE SURG SS PI 7.0 STRL IVOR (GLOVE) ×4 IMPLANT
GOWN PREVENTION PLUS XLARGE (GOWN DISPOSABLE) ×2 IMPLANT
GOWN STRL NON-REIN LRG LVL3 (GOWN DISPOSABLE) ×2 IMPLANT
KIT BASIN OR (CUSTOM PROCEDURE TRAY) ×2 IMPLANT
KIT ROOM TURNOVER OR (KITS) ×2 IMPLANT
NEEDLE 18GX1X1/2 (RX/OR ONLY) (NEEDLE) ×2 IMPLANT
NEEDLE HYPO 25GX1X1/2 BEV (NEEDLE) ×2 IMPLANT
NEEDLE SPNL 22GX3.5 QUINCKE BK (NEEDLE) ×2 IMPLANT
NS IRRIG 1000ML POUR BTL (IV SOLUTION) ×4 IMPLANT
PACK GENERAL/GYN (CUSTOM PROCEDURE TRAY) ×2 IMPLANT
PAD ARMBOARD 7.5X6 YLW CONV (MISCELLANEOUS) ×2 IMPLANT
SPECIMEN JAR X LARGE (MISCELLANEOUS) ×4 IMPLANT
SPONGE GAUZE 4X4 12PLY (GAUZE/BANDAGES/DRESSINGS) ×2 IMPLANT
SPONGE LAP 18X18 X RAY DECT (DISPOSABLE) ×4 IMPLANT
SPONGE LAP 4X18 X RAY DECT (DISPOSABLE) ×4 IMPLANT
STAPLER VISISTAT 35W (STAPLE) ×2 IMPLANT
SUT ETHILON 2 0 FS 18 (SUTURE) ×4 IMPLANT
SUT MNCRL AB 3-0 PS2 18 (SUTURE) ×4 IMPLANT
SUT VIC AB 3-0 SH 18 (SUTURE) ×4 IMPLANT
SYR 50ML LL SCALE MARK (SYRINGE) ×2 IMPLANT
SYR CONTROL 10ML LL (SYRINGE) ×2 IMPLANT
TOWEL OR 17X24 6PK STRL BLUE (TOWEL DISPOSABLE) ×2 IMPLANT
TOWEL OR 17X26 10 PK STRL BLUE (TOWEL DISPOSABLE) ×2 IMPLANT

## 2012-04-24 NOTE — Addendum Note (Signed)
Addendum  created 04/24/12 1240 by Rian Busche R Aahan Marques, CRNA   Modules edited:Anesthesia Medication Administration    

## 2012-04-24 NOTE — Transfer of Care (Signed)
Immediate Anesthesia Transfer of Care Note  Patient: Krystal Watson  Procedure(s) Performed: Procedure(s) (LRB) with comments: SIMPLE MASTECTOMY WITH AXILLARY SENTINEL NODE BIOPSY (Bilateral) - Bilateral total mastectomy and bilateral sentinel node  Patient Location: PACU  Anesthesia Type:General  Level of Consciousness: awake, alert  and oriented  Airway & Oxygen Therapy: Patient Spontanous Breathing and Patient connected to nasal cannula oxygen  Post-op Assessment: Report given to PACU RN and Post -op Vital signs reviewed and stable  Post vital signs: Reviewed and stable  Complications: No apparent anesthesia complications

## 2012-04-24 NOTE — Anesthesia Preprocedure Evaluation (Signed)
Anesthesia Evaluation   Patient awake    Reviewed: Allergy & Precautions, H&P , NPO status , Patient's Chart, lab work & pertinent test results  Airway       Dental   Pulmonary shortness of breath,          Cardiovascular hypertension,     Neuro/Psych    GI/Hepatic GERD-  ,  Endo/Other  diabetes  Renal/GU      Musculoskeletal   Abdominal   Peds  Hematology   Anesthesia Other Findings   Reproductive/Obstetrics                           Anesthesia Physical Anesthesia Plan  ASA: III  Anesthesia Plan: General   Post-op Pain Management:    Induction: Intravenous  Airway Management Planned: LMA  Additional Equipment:   Intra-op Plan:   Post-operative Plan: Extubation in OR  Informed Consent:   Plan Discussed with: CRNA, Anesthesiologist and Surgeon  Anesthesia Plan Comments:         Anesthesia Quick Evaluation

## 2012-04-24 NOTE — Addendum Note (Signed)
Addendum  created 04/24/12 1240 by Luster Landsberg, CRNA   Modules edited:Anesthesia Medication Administration

## 2012-04-24 NOTE — H&P (View-Only) (Signed)
Chief complaint: Hard area in reolar area of both breasts  History of present illness: This patient has bilateral DCIS and has been scheduled for bilateral mastectomies. She is declined any reconstruction at this time because she needs to lose weight. She is noticed a very hard area in the lateral areolar margin bilaterally. They seem to be getting a little bit better. She's concerned about these and came in for a checkup to be sure there were no issues that would interfere with the planned surgery next week.  Exam: Vital signs:BP 138/84  Pulse 84  Temp 97.6 F (36.4 C)  Resp 16  Ht 5' 2.5" (1.588 m)  Wt 267 lb 3.2 oz (121.201 kg)  BMI 48.09 kg/m2  General: The patient alert oriented healthy-appearing Breasts: Symmetric. There is about a 3 cm hard area that is at the areolar margin bilaterally. I think this is consistent with resolving hematoma or some fat necrosis from her biopsies. These were not present prior to the biopsy, and my seeing her.  Impression: Small bilateral traumatic hematoma secondary to needle core biopsies  Plan: Reassured the patient that we could proceed with surgery as scheduled. Reviewed the plans and answered questions.. 

## 2012-04-24 NOTE — Op Note (Signed)
BISMA KLETT 31-Jul-1945 [id number] 04/10/2012  Preoperative diagnosis: breast cancer, DCIS, clinical stage 0, bilateral  Postoperative diagnosis: same  Procedure: bilateral total mastectomy withbilateral blue dye injection and bilateral central lymph node dissection Surgeon: Currie Paris, MD, FACS   Anesthesia: General   Clinical History and Indications: this patient was recently done exposed with a DCIS. There is a fairly extensive area involved. An MRI subsequently showed some suspicious areas the contralateral breast and biopsy also confirmed DCIS there. The patient elected to have bilateral total mastectomies with subsequent Sentinel lymph node dissections  Description of Procedure: I saw the patient preoperative area, reviewed the plans with the patient and she and further questions. She was then taken to the operating room. After satisfactory general endotracheal anesthesia was obtained a timeout was done. Both breasts are then injected with dilute methylene blue 10 cc per side.  A full prep and drape was then done. Ice and the approach was identified a hot area in the left axilla. This was marked. I then made an elliptical incision raise usual skin flaps. I injected dilute Marcaine with epinephrine to help raise the flap 60 cc for the superior and 60 for the inferior. The flaps were raised towards the sternum and supraclavicular full infraclavicular area and latissimus. Once flaps were raised is able to dissect in the axillary area and identify a hot blue lymph node. Once it was removed there is no other blue lymph nodes, no hot areas and no palpably abnormal areas. This was sent for touch preps.  The breast was then removed from medial to lateral and superior to inferior. I do not oriented he axilla. This is all done with cautery the clips were used for larger vessels. Once the breast was removed I irrigated and made sure everything was dry. I placed a 19 Blake drain secured with  2-0 nylon. While waiting for pathology report I placed a moist lap pad which was soaked in "bug juice."  The right side was then done in an identical fashion. I was then able to identify single sentinel lymph node. It was hot, but not limited. The breast was removed similar to the left side. Again a drain was placed in a moist lap placed with antibiotic solution. At this time pathology reported that the left-sided stone that was negative. Blood site was then irrigated again checked for hemostasis and closed with a running 3-0 Monocryl subcuticular plus Dermabond. At this time the pathologist report the right lymph node was negative so it was closed similar to the left side.  The patient tolerated the procedure well.There were no operative complications. Estimated blood loss was 250 cc.  Currie Paris, MD, FACS 04/24/2012 11:15 AM

## 2012-04-24 NOTE — Anesthesia Procedure Notes (Signed)
Procedure Name: Intubation Date/Time: 04/24/2012 8:36 AM Performed by: Luster Landsberg Pre-anesthesia Checklist: Patient identified, Emergency Drugs available, Suction available and Patient being monitored Patient Re-evaluated:Patient Re-evaluated prior to inductionOxygen Delivery Method: Circle system utilized Preoxygenation: Pre-oxygenation with 100% oxygen Intubation Type: IV induction Laryngoscope Size: Mac and 3 Grade View: Grade I Tube type: Oral Tube size: 7.0 mm Number of attempts: 1 Airway Equipment and Method: Stylet and LTA kit utilized Placement Confirmation: ETT inserted through vocal cords under direct vision,  positive ETCO2 and breath sounds checked- equal and bilateral Secured at: 22 cm Tube secured with: Tape Dental Injury: Teeth and Oropharynx as per pre-operative assessment

## 2012-04-24 NOTE — Interval H&P Note (Signed)
History and Physical Interval Note:  04/24/2012 7:59 AM  Krystal Watson  has presented today for surgery, with the diagnosis of bilateral breast cancer  The various methods of treatment have been discussed with the patient and family. After consideration of risks, benefits and other options for treatment, the patient has consented to  Procedure(s) (LRB) with comments: SIMPLE MASTECTOMY WITH AXILLARY SENTINEL NODE BIOPSY (Bilateral) - Bilateral total mastectomy and bilateral sentinel node as a surgical intervention .  The patient's history has been reviewed, patient examined, no change in status, stable for surgery.  I have reviewed the patient's chart and labs.  Questions were answered to the patient's satisfaction.     [name]

## 2012-04-24 NOTE — Anesthesia Postprocedure Evaluation (Signed)
  Anesthesia Post-op Note  Patient: Krystal Watson  Procedure(s) Performed: Procedure(s) (LRB) with comments: SIMPLE MASTECTOMY WITH AXILLARY SENTINEL NODE BIOPSY (Bilateral) - Bilateral total mastectomy and bilateral sentinel node  Patient Location: PACU  Anesthesia Type:General  Level of Consciousness: awake  Airway and Oxygen Therapy: Patient Spontanous Breathing  Post-op Pain: mild  Post-op Assessment: Post-op Vital signs reviewed  Post-op Vital Signs: Reviewed  Complications: No apparent anesthesia complications

## 2012-04-25 LAB — BASIC METABOLIC PANEL
BUN: 10 mg/dL (ref 6–23)
Calcium: 9 mg/dL (ref 8.4–10.5)
Creatinine, Ser: 0.65 mg/dL (ref 0.50–1.10)
GFR calc non Af Amer: >90 mL/min (ref >90)
Glucose, Bld: 117 mg/dL — ABNORMAL HIGH (ref 70–99)

## 2012-04-25 LAB — CBC
HCT: 34.5 % — ABNORMAL LOW (ref 36.0–46.0)
Hemoglobin: 11.1 g/dL — ABNORMAL LOW (ref 12.0–15.0)
MCH: 31.5 pg (ref 26.0–34.0)
MCHC: 32.2 g/dL (ref 30.0–36.0)

## 2012-04-25 MED ORDER — OXYCODONE-ACETAMINOPHEN 5-325 MG PO TABS: 1.0000 | ORAL_TABLET | ORAL | Status: DC | PRN

## 2012-04-25 NOTE — Progress Notes (Signed)
1 Day Post-Op   Assessment: s/p Procedure(s): SIMPLE MASTECTOMY WITH AXILLARY SENTINEL NODE BIOPSY Patient Active Problem List  Diagnosis  . OBESITY  . GERD  . CONSTIPATION  . CHOLEDOCHOLITHIASIS  . ABDOMINAL PAIN-RUQ  . Breast cancer, right breast, UOQ, DCIS    Doing well post bilateral mastectomy  Plan: Discharge  Subjective: Feels OK and pain controlled. Wants to go home  Objective: Vital signs in last 24 hours: Temp:  [96.8 F (36 C)-98.8 F (37.1 C)] 98.8 F (37.1 C) (01/23 0541) Pulse Rate:  [48-76] 60  (01/23 0541) Resp:  [11-20] 16  (01/23 0541) BP: (92-146)/(43-86) 92/43 mmHg (01/23 0541) SpO2:  [96 %-100 %] 96 % (01/23 0541) Weight:  [267 lb 10.2 oz (121.4 kg)] 267 lb 10.2 oz (121.4 kg) (01/22 1358)   Intake/Output from previous day: 01/22 0701 - 01/23 0700 In: 1990 [P.O.:240; I.V.:1700; IV Piggyback:50] Out: 1370 [Urine:850; Drains:420; Blood:100] Intake/Output this shift: Total I/O In: 240 [P.O.:240] Out: -    General appearance: alert, cooperative and no distress Resp: clear to auscultation bilaterally  Incision: healing well, JP's draining OK, red gatorade.  Lab Results:   [facility] 04/25/12 0605  WBC 8.1  HGB 11.1*  HCT 34.5*  PLT 207   BMET  Basename 04/25/12 0605  NA 138  K 3.4*  CL 100  CO2 28  GLUCOSE 117*  BUN 10  CREATININE 0.65  CALCIUM 9.0   PT/INR No results found for this basename: LABPROT:2,INR:2 in the last 72 hours ABG No results found for this basename: PHART:2,PCO2:2,PO2:2,HCO3:2 in the last 72 hours  MEDS, Scheduled    . atorvastatin  40 mg Oral Daily  . diltiazem  240 mg Oral Daily  . heparin  5,000 Units Subcutaneous Q8H  . hydrochlorothiazide  25 mg Oral Daily  . metFORMIN  500 mg Oral BID WC  . pioglitazone  30 mg Oral Daily  . ramipril  10 mg Oral Daily    Studies/Results: Nm Sentinel Node Inj-no Rpt (breast)  04/24/2012  CLINICAL DATA: bilateral breast cancer   Sulfur colloid was injected  intradermally by the nuclear medicine  technologist for breast cancer sentinel node localization.        LOS: 1 day     Currie Paris, MD, [facility], [place] [phone]   04/25/2012 9:58 AM

## 2012-04-25 NOTE — Progress Notes (Signed)
DC HOME WITH HUSBAND, VERBALLY UNDERSTOOD DC INSTRUCTIONS, NO QUESTIONS ASKED, FEELS COMFORTABLE IN  DRAIN CARE, SUPPLIES SENT HOME WITH PT.[name]

## 2012-04-26 ENCOUNTER — Encounter (HOSPITAL_COMMUNITY): Payer: Self-pay | Admitting: Surgery

## 2012-04-29 ENCOUNTER — Telehealth (INDEPENDENT_AMBULATORY_CARE_PROVIDER_SITE_OTHER): Payer: Self-pay | Admitting: General Surgery

## 2012-04-29 NOTE — Discharge Summary (Signed)
Patient ID: Krystal Watson [id number] 66 y.o. 1946-03-20  Admission  Date: 04/24/2012  Discharge date and time: 04/25/2012 11:46 AM  Admitting Physician: Currie Paris  Discharge Physician: Currie Paris  Admission Diagnoses: bilateral breast cancer  Discharge Diagnoses: Same,   Operations: Procedure(s): SIMPLE MASTECTOMY WITH AXILLARY SENTINEL NODE BIOPSY, bilateral  Discharged Condition: good  Hospital Course: The patient was kept overnight following surgery, had an uneventful stay and discharged the day after surgery  Consults: None  Significant Diagnostic Studies: Path: Diagnosis 1. Lymph node, sentinel, biopsy, Left axillary - ONE LYMPH NODE, NEGATIVE FOR TUMOR (0/1). 2. Lymph node, sentinel, biopsy, Right axillary - ONE LYMPH NODE, NEGATIVE FOR TUMOR (0/1). 3. Breast, simple mastectomy, Left - DUCTAL CARCINOMA IN SITU, GRADE III, SEE COMMENT. - IN SITU CARCINOMA IS 3.2 CM FROM NEAREST MARGIN (DEEP). - ONE LYMPH NODE, NEGATIVE FOR TUMOR (0/1). - SEE TUMOR TEMPLATE BELOW. 4. Breast, simple mastectomy, Right - TUMOR #1 (HOURGLASS TYPE CLIP - CENTRAL) - HIGH GRADE DUCTAL CARCINOMA IN SITU, SEE COMMENT. - IN SITU CARCINOMA IS 4 MM FROM NEAREST MARGIN (DEEP). - TUMOR #2 (T-SHAPED CLIP - SUPERIOR / LATERAL) - HIGH GRADE DUCTAL CARCINOMA IN SITU, SEE COMMENT. - IN SITU CARCINOMA IS 2.2 CM FROM NEAREST MARGIN (DEEP) - TUMOR AREA #3 (NODULAR TISSUE BETWEEN CLIPS) - HIGH GRADE DUCTAL CARCINOMA IN SITU, SEE COMMEN   Disposition: Home

## 2012-04-29 NOTE — Telephone Encounter (Signed)
Message copied by Liliana Cline on Mon Apr 29, 2012 11:43 AM ------      Message from: Currie Paris      Created: Mon Apr 29, 2012 11:43 AM       Tell the patient that her margins are OK and her lymph nodes are negative. I will discuss in detail in the office. No invasive cancer found, only the DCIS

## 2012-04-29 NOTE — Telephone Encounter (Signed)
Patient aware path results are good. Lymph nodes negative and margins ok. She will follow up in the office at her scheduled appt and call with any questions prior.  

## 2012-05-01 ENCOUNTER — Encounter (INDEPENDENT_AMBULATORY_CARE_PROVIDER_SITE_OTHER): Payer: Self-pay | Admitting: Surgery

## 2012-05-01 ENCOUNTER — Ambulatory Visit (INDEPENDENT_AMBULATORY_CARE_PROVIDER_SITE_OTHER): Payer: BC Managed Care – PPO | Admitting: Surgery

## 2012-05-01 VITALS — BP 112/74 | HR 70 | Temp 97.1°F | Resp 16 | Ht 62.5 in | Wt 255.4 lb

## 2012-05-01 DIAGNOSIS — C50911 Malignant neoplasm of unspecified site of right female breast: Secondary | ICD-10-CM

## 2012-05-01 DIAGNOSIS — C50919 Malignant neoplasm of unspecified site of unspecified female breast: Secondary | ICD-10-CM

## 2012-05-01 NOTE — Patient Instructions (Addendum)
Come to see ma again in two weeks. When the drain slows to less than 30 cc in 24 hours call the office and one of the nurses can take the drain out

## 2012-05-01 NOTE — Progress Notes (Signed)
Krystal Watson    [id number] 05/01/2012    December 25, 1945   CC:  Chief Complaint  Patient presents with  . Routine Post Op    1st po bil maste     HPI: The patient returns for post op follow-up. She underwent a bilateral mastectomy and sentinel nodes on 12214. Over all she feels that she is doing well. Drains still about 40-50 cc/day. Minimal pain.  PE: VITAL SIGNS: BP 112/74  Pulse 70  Temp 97.1 F (36.2 C) (Temporal)  Resp 16  Ht 5' 2.5" (1.588 m)  Wt 255 lb 6.4 oz (115.849 kg)  BMI 45.97 kg/m2  The incision is healing nicely and there is no evidence of infection or hematoma.  The drains are let in situ - clear drainage.  DATA REVIEWED: Pathology report showed: Diagnosis 1. Lymph node, sentinel, biopsy, Left axillary - ONE LYMPH NODE, NEGATIVE FOR TUMOR (0/1). 2. Lymph node, sentinel, biopsy, Right axillary - ONE LYMPH NODE, NEGATIVE FOR TUMOR (0/1). 3. Breast, simple mastectomy, Left - DUCTAL CARCINOMA IN SITU, GRADE III, SEE COMMENT. - IN SITU CARCINOMA IS 3.2 CM FROM NEAREST MARGIN (DEEP). - ONE LYMPH NODE, NEGATIVE FOR TUMOR (0/1). - SEE TUMOR TEMPLATE BELOW. 4. Breast, simple mastectomy, Right - TUMOR #1 (HOURGLASS TYPE CLIP - CENTRAL) - HIGH GRADE DUCTAL CARCINOMA IN SITU, SEE COMMENT. - IN SITU CARCINOMA IS 4 MM FROM NEAREST MARGIN (DEEP). - TUMOR #2 (T-SHAPED CLIP - SUPERIOR / LATERAL) - HIGH GRADE DUCTAL CARCINOMA IN SITU, SEE COMMENT. - IN SITU CARCINOMA IS 2.2 CM FROM NEAREST MARGIN (DEEP) - TUMOR AREA #3 (NODULAR TISSUE BETWEEN CLIPS) - HIGH GRADE DUCTAL CARCINOMA IN SITU, SEE COMMENT. IMPRESSION: Patient doing well.   PLAN: Her next visit will be in two weeks but she will call when the drains slow down so we can remove them.

## 2012-05-08 ENCOUNTER — Ambulatory Visit (INDEPENDENT_AMBULATORY_CARE_PROVIDER_SITE_OTHER): Payer: BC Managed Care – PPO | Admitting: General Surgery

## 2012-05-08 DIAGNOSIS — Z4803 Encounter for change or removal of drains: Secondary | ICD-10-CM

## 2012-05-08 DIAGNOSIS — Z4889 Encounter for other specified surgical aftercare: Secondary | ICD-10-CM

## 2012-05-08 NOTE — Progress Notes (Signed)
Patient comes in status post bilateral mastectomies for breast cancer on 04/24/2012 with drains still in place. Both drains have drained around 20 cc a day for the last two days. Both drains removed without difficulty, suction released and stitches clipped. Drain sites were covered with gauze/triple anitibiotic ointment. Patient instructed on signs of infection. She will keep follow up appt next week with Dr Jamey Ripa.

## 2012-05-08 NOTE — Patient Instructions (Signed)
Call with any redness, increased drainage from drain site or uncomfortable build up of fluid at incision. Keep follow up appt with Dr Jamey Ripa.

## 2012-05-14 ENCOUNTER — Encounter (INDEPENDENT_AMBULATORY_CARE_PROVIDER_SITE_OTHER): Payer: BC Managed Care – PPO | Admitting: Surgery

## 2012-05-17 ENCOUNTER — Encounter (INDEPENDENT_AMBULATORY_CARE_PROVIDER_SITE_OTHER): Payer: BC Managed Care – PPO | Admitting: Surgery

## 2012-05-21 ENCOUNTER — Ambulatory Visit (INDEPENDENT_AMBULATORY_CARE_PROVIDER_SITE_OTHER): Payer: BC Managed Care – PPO | Admitting: Surgery

## 2012-05-21 ENCOUNTER — Encounter (INDEPENDENT_AMBULATORY_CARE_PROVIDER_SITE_OTHER): Payer: Self-pay | Admitting: Surgery

## 2012-05-21 VITALS — BP 124/80 | HR 82 | Temp 97.2°F | Resp 16 | Ht 62.5 in | Wt 258.8 lb

## 2012-05-21 DIAGNOSIS — Z09 Encounter for follow-up examination after completed treatment for conditions other than malignant neoplasm: Secondary | ICD-10-CM

## 2012-05-21 NOTE — Progress Notes (Signed)
Krystal Watson    [id number] 05/21/2012    1945/08/07   CC:  Chief Complaint  Patient presents with  . Routine Post Op    Mastectomy      HPI: The patient returns for post op follow-up. She underwent a bilateral mastectomy and sentinel nodes on 12214. Over all she feels that she is doing well. Drains removed last visit  PE: VITAL SIGNS: BP 124/80  Pulse 82  Temp(Src) 97.2 F (36.2 C) (Temporal)  Resp 16  Ht 5' 2.5" (1.588 m)  Wt 258 lb 12.8 oz (117.391 kg)  BMI 46.55 kg/m2 Incisions healing nicely. Bilateral seromas present, aspirated 140 cc right and 100 cc left   DATA REVIEWED: Pathology report showed: Diagnosis 1. Lymph node, sentinel, biopsy, Left axillary - ONE LYMPH NODE, NEGATIVE FOR TUMOR (0/1). 2. Lymph node, sentinel, biopsy, Right axillary - ONE LYMPH NODE, NEGATIVE FOR TUMOR (0/1). 3. Breast, simple mastectomy, Left - DUCTAL CARCINOMA IN SITU, GRADE III, SEE COMMENT. - IN SITU CARCINOMA IS 3.2 CM FROM NEAREST MARGIN (DEEP). - ONE LYMPH NODE, NEGATIVE FOR TUMOR (0/1). - SEE TUMOR TEMPLATE BELOW. 4. Breast, simple mastectomy, Right - TUMOR #1 (HOURGLASS TYPE CLIP - CENTRAL) - HIGH GRADE DUCTAL CARCINOMA IN SITU, SEE COMMENT. - IN SITU CARCINOMA IS 4 MM FROM NEAREST MARGIN (DEEP). - TUMOR #2 (T-SHAPED CLIP - SUPERIOR / LATERAL) - HIGH GRADE DUCTAL CARCINOMA IN SITU, SEE COMMENT. - IN SITU CARCINOMA IS 2.2 CM FROM NEAREST MARGIN (DEEP) - TUMOR AREA #3 (NODULAR TISSUE BETWEEN CLIPS) - HIGH GRADE DUCTAL CARCINOMA IN SITU, SEE COMMENT. IMPRESSION: Patient doing well. Seromas  PLAN: She will go back to wearing the binder and see someone next week for a repeat aspiration

## 2012-05-21 NOTE — Patient Instructions (Signed)
See one of my associates next week. Keep the binder on during the day

## 2012-05-28 ENCOUNTER — Encounter (INDEPENDENT_AMBULATORY_CARE_PROVIDER_SITE_OTHER): Payer: Self-pay | Admitting: General Surgery

## 2012-05-28 ENCOUNTER — Ambulatory Visit (INDEPENDENT_AMBULATORY_CARE_PROVIDER_SITE_OTHER): Payer: BC Managed Care – PPO | Admitting: General Surgery

## 2012-05-28 VITALS — BP 128/80 | HR 72 | Temp 97.1°F | Resp 18 | Ht 62.5 in | Wt 258.0 lb

## 2012-05-28 NOTE — Progress Notes (Signed)
Patient ID: Krystal Watson, female   DOB: 1945/04/10, 67 y.o.   MRN: [id number] History: Dr. Tenna Child patient. Underwent bilateral mastectomy and sentinel node biopsy 04/24/2012. Bilateral DCIS. Has developed recurrent seromas. Last aspirated Feb. 18.. She can feel fluid. No pain or fever. She is going on vacation, a cruise, next week with her family  Exam: Pleasant patient. No distress. Bilateral mastectomy wounds healing without signs of infection. Sterile prep. 200 cc serous fluid aspirated from the right side, and 100 cc of serous fluid aspirated the left side. Tolerated well.  Assessment: Status post bilateral total mastectomy and sentinel node biopsy for multifocal DCIS. Bilateral wound seromas.  Plan: Bilateral mastectomy wound seromas aspirated today If high-volume reaccumulation persists, she may need placement of drains. Return to see Dr. Jamey Ripa in 2 weeks. Unable to return next week because of family vacation.   Angelia Mould. Derrell Lolling, M.D., [facility] General and Minimally invasive Surgery Breast and Colorectal Surgery Office:   [phone] Pager:   [phone]

## 2012-05-28 NOTE — Patient Instructions (Signed)
We drained 200 cc from the right astectomy incision, and 100 cc from the left.  Return to see Dr. Jamey Ripa in 2 weeks after you return from your vacation.

## 2012-06-11 ENCOUNTER — Ambulatory Visit (INDEPENDENT_AMBULATORY_CARE_PROVIDER_SITE_OTHER): Payer: BC Managed Care – PPO | Admitting: Surgery

## 2012-06-11 ENCOUNTER — Encounter (INDEPENDENT_AMBULATORY_CARE_PROVIDER_SITE_OTHER): Payer: Self-pay | Admitting: Surgery

## 2012-06-11 VITALS — BP 138/66 | HR 68 | Temp 97.1°F | Resp 16 | Ht 62.5 in | Wt 260.0 lb

## 2012-06-11 DIAGNOSIS — Z09 Encounter for follow-up examination after completed treatment for conditions other than malignant neoplasm: Secondary | ICD-10-CM

## 2012-06-11 NOTE — Progress Notes (Signed)
Chief complaint: Postop bilateral mastectomy check for seroma  This patient is back for followup. When she was here 2 weeks ago she had bilateral seromas aspirated. She thinks is more fluid present.  Exam: She does have fluid on the right side I was able to aspirate 160 cc. This seems to be a little bit of fluid on left side but unable to aspirate any.  Plan: We'll see her back in one week and try to keep this area aspirated and dried.

## 2012-06-11 NOTE — Patient Instructions (Signed)
See me again next week 

## 2012-06-18 ENCOUNTER — Ambulatory Visit (INDEPENDENT_AMBULATORY_CARE_PROVIDER_SITE_OTHER): Payer: BC Managed Care – PPO | Admitting: Surgery

## 2012-06-18 ENCOUNTER — Encounter (INDEPENDENT_AMBULATORY_CARE_PROVIDER_SITE_OTHER): Payer: Self-pay | Admitting: Surgery

## 2012-06-18 VITALS — BP 124/76 | HR 70 | Temp 96.7°F | Resp 18 | Ht 62.5 in | Wt 259.2 lb

## 2012-06-18 DIAGNOSIS — Z09 Encounter for follow-up examination after completed treatment for conditions other than malignant neoplasm: Secondary | ICD-10-CM

## 2012-06-18 NOTE — Progress Notes (Signed)
Chief complaint: Postop bilateral mastectomy check for seroma  This patient is back for followup. When she was here 1 week  ago she had a right seroma aspirated (160cc). She thinks is more fluid present.  Exam: She does have fluid on the right side I was able to aspirate 100 cc. No apparent fluid on the left Plan: We'll see her back in one week and try to keep this area aspirated and dried.

## 2012-06-18 NOTE — Patient Instructions (Signed)
See me again next week 

## 2012-06-25 ENCOUNTER — Encounter (INDEPENDENT_AMBULATORY_CARE_PROVIDER_SITE_OTHER): Payer: Self-pay | Admitting: Surgery

## 2012-06-25 ENCOUNTER — Ambulatory Visit (INDEPENDENT_AMBULATORY_CARE_PROVIDER_SITE_OTHER): Payer: BC Managed Care – PPO | Admitting: Surgery

## 2012-06-25 VITALS — BP 140/92 | HR 68 | Temp 97.4°F | Resp 16 | Ht 62.5 in | Wt 259.0 lb

## 2012-06-25 DIAGNOSIS — Z09 Encounter for follow-up examination after completed treatment for conditions other than malignant neoplasm: Secondary | ICD-10-CM

## 2012-06-25 NOTE — Progress Notes (Signed)
Chief complaint: Postop bilateral mastectomy check for seroma  This patient is back for followup. When she was here 1 week  ago she had a right seroma aspirated (100cc). She thinks is more fluid present.  Exam: She does have fluid on the right side I was able to aspirate 75cc. No apparent fluid on the left Plan: We'll see her back in one week and try to keep this area aspirated and dried.

## 2012-06-25 NOTE — Patient Instructions (Signed)
See me again next week 

## 2012-07-05 ENCOUNTER — Ambulatory Visit (INDEPENDENT_AMBULATORY_CARE_PROVIDER_SITE_OTHER): Payer: BC Managed Care – PPO | Admitting: Surgery

## 2012-07-05 ENCOUNTER — Encounter (INDEPENDENT_AMBULATORY_CARE_PROVIDER_SITE_OTHER): Payer: Self-pay | Admitting: Surgery

## 2012-07-05 VITALS — BP 124/76 | HR 68 | Temp 97.2°F | Resp 16 | Ht 62.5 in | Wt 258.4 lb

## 2012-07-05 DIAGNOSIS — Z09 Encounter for follow-up examination after completed treatment for conditions other than malignant neoplasm: Secondary | ICD-10-CM

## 2012-07-05 NOTE — Progress Notes (Signed)
Chief complaint: Postop bilateral mastectomy check for seroma  This patient is back for followup. When she was here 1 week  ago she had a right seroma aspirated (75cc). She thinks is more fluid present.  Exam: She does have fluid on the right side I was able to aspirate 75cc, slightly sero-sanguinous No apparent fluid on the left Plan: We'll see her back in one week and try to keep this area aspirated and dry.

## 2012-07-05 NOTE — Patient Instructions (Signed)
See me again next week 

## 2012-07-10 ENCOUNTER — Ambulatory Visit (INDEPENDENT_AMBULATORY_CARE_PROVIDER_SITE_OTHER): Payer: BC Managed Care – PPO | Admitting: Surgery

## 2012-07-10 ENCOUNTER — Encounter (INDEPENDENT_AMBULATORY_CARE_PROVIDER_SITE_OTHER): Payer: Self-pay | Admitting: Surgery

## 2012-07-10 VITALS — BP 132/84 | HR 50 | Temp 96.5°F | Resp 18 | Ht 62.5 in | Wt 260.4 lb

## 2012-07-10 DIAGNOSIS — Z09 Encounter for follow-up examination after completed treatment for conditions other than malignant neoplasm: Secondary | ICD-10-CM

## 2012-07-10 NOTE — Patient Instructions (Addendum)
See me again in four weeks I spoke with Dr Nehemiah Settle and they will see you today

## 2012-07-10 NOTE — Progress Notes (Signed)
followup of bilateral postmastectomy seroma this. At her last visit there was none on the left side and only a small and the right. She feels like she is doing well.  On complaint has been some heaviness in chest discomfort. It is not related to activities. This is a new development really over the last couple of weeks but worse the last couple of days. There is no radiating nature to the pain down her arm. It is just a heavy tight feeling across her chest.  Exam: Vital signs:BP 132/84  Pulse 50  Temp(Src) 96.5 F (35.8 C) (Temporal)  Resp 18  Ht 5' 2.5" (1.588 m)  Wt 260 lb 6.4 oz (118.117 kg)  BMI 46.84 kg/m2 General: The patient alert oriented healthy-appearing and in no distress. She appears comfortable. Heart: Regular rhythm no murmurs rubs or gallops Breasts: Bilateral mastectomy scars. She has excess skin which was left for reconstruction purposes. There is not appear to be a seroma on either side. There is no evidence of infection  Impression: 1. Doing well status post bilateral mastectomies 2. Chest symptoms uncertain etiology. These may well be some delayed reaction to her surgery but I am concerned about the nature of the symptoms.  Plan: 1. I'll see her back for breast recheck in 4 weeks 2. I spoke with her primary care physician today and they will see her later today to be sure she is not having some cardiac issues as cause for chest symptoms.

## 2012-07-11 ENCOUNTER — Telehealth (INDEPENDENT_AMBULATORY_CARE_PROVIDER_SITE_OTHER): Payer: Self-pay | Admitting: General Surgery

## 2012-07-11 NOTE — Telephone Encounter (Signed)
     Krystal Watson  is OK to attend the ABC Class Currie Paris, MD, [facility], [place] [phone] 07/11/2012 10:39 AM     \s

## 2012-07-11 NOTE — Telephone Encounter (Signed)
Patient called to inquire about an exercise class because she has tightness in her chest. They have ruled out any other medical reasons for the tightness and are stating this is probably related to surgery. Patient has not been to the ABC class yet. I told her I would mail her a release and encouraged her to call and register to go to this class. They would evaluate her and advise if she needs further physical therapy.

## 2012-07-15 NOTE — Telephone Encounter (Signed)
Agree - They should be able to help her at the [facility] class

## 2012-08-08 ENCOUNTER — Encounter (INDEPENDENT_AMBULATORY_CARE_PROVIDER_SITE_OTHER): Payer: Self-pay | Admitting: Surgery

## 2012-08-08 ENCOUNTER — Ambulatory Visit (INDEPENDENT_AMBULATORY_CARE_PROVIDER_SITE_OTHER): Payer: BC Managed Care – PPO | Admitting: Surgery

## 2012-08-08 VITALS — BP 124/74 | HR 70 | Temp 97.6°F | Resp 16 | Ht 62.0 in | Wt 257.4 lb

## 2012-08-08 DIAGNOSIS — C801 Malignant (primary) neoplasm, unspecified: Secondary | ICD-10-CM

## 2012-08-08 DIAGNOSIS — Z09 Encounter for follow-up examination after completed treatment for conditions other than malignant neoplasm: Secondary | ICD-10-CM

## 2012-08-08 NOTE — Patient Instructions (Signed)
See me again in three  Months. If yo do not hear about an oncology appointment by Monday, let us know

## 2012-08-08 NOTE — Progress Notes (Signed)
She is in for final post op visit, hs had seromas that have finally resolved Somehow, she has not seen an oncologist  Exam: Vital signs:BP 124/74  Pulse 70  Temp(Src) 97.6 F (36.4 C)  Resp 16  Ht 5\' 2"  (1.575 m)  Wt 257 lb 6.4 oz (116.756 kg)  BMI 47.07 kg/m2 General: The patient alert oriented healthy-appearing and in no distress. She appears comfortable. Heart: Regular rhythm no murmurs rubs or gallops Breasts: Bilateral mastectomy scars. She has excess skin which was left for reconstruction purposes. There is not appear to be a seroma on either side. There is no evidence of infection  Impression: 1. Doing well status post bilateral mastectomies 2. Chest symptoms still being evaluated  Plan: RTC 3 months

## 2012-08-08 NOTE — Addendum Note (Signed)
Addended by: Ethlyn Gallery on: 08/08/2012 04:00 PM   Modules accepted: Orders

## 2012-08-12 ENCOUNTER — Telehealth: Payer: Self-pay | Admitting: *Deleted

## 2012-08-12 NOTE — Telephone Encounter (Signed)
Confirmed 08/19/12 appt w/ pt.  Mailed before appt letter & packet to pt.  Emailed Clydie Braun for [employer].  Emailed Elease Hashimoto w/ [facility] to make aware.  Took paperwork to Med [facility] for chart.

## 2012-08-14 ENCOUNTER — Encounter: Payer: Self-pay | Admitting: Radiation Oncology

## 2012-08-14 NOTE — Progress Notes (Addendum)
Location of Breast Cancer: Left & Right Histology per Pathology Report: 04/24/12  1. Lymph node, sentinel, biopsy, Left axillary - ONE LYMPH NODE, NEGATIVE FOR TUMOR (0/1). 2. Lymph node, sentinel, biopsy, Right axillary - ONE LYMPH NODE, NEGATIVE FOR TUMOR (0/1). 3. Breast, simple mastectomy, Left - DUCTAL CARCINOMA IN SITU, GRADE III, SEE COMMENT. - IN SITU CARCINOMA IS 3.2 CM FROM NEAREST MARGIN (DEEP). - ONE LYMPH NODE, NEGATIVE FOR TUMOR (0/1). - SEE TUMOR TEMPLATE BELOW. 4. Breast, simple mastectomy, Right - TUMOR #1 (HOURGLASS TYPE CLIP - CENTRAL) - HIGH GRADE DUCTAL CARCINOMA IN SITU, SEE COMMENT. - IN SITU CARCINOMA IS 4 MM FROM NEAREST MARGIN (DEEP). - TUMOR #2 (T-SHAPED CLIP - SUPERIOR / LATERAL) - HIGH GRADE DUCTAL CARCINOMA IN SITU, SEE COMMENT. - IN SITU CARCINOMA IS 2.2 CM FROM NEAREST MARGIN (DEEP) - TUMOR AREA #3 (NODULAR TISSUE BETWEEN CLIPS) - HIGH GRADE DUCTAL CARCINOMA IN SITU, SEE COMMENT. - IN SITU CARCINOMA IS 0.8 CM FROM NEAREST MARGIN  Receptor Status: ER(neg, PR (neg), Her2-neu ( )  Did patient present with symptoms (if so, please note symptoms) or was this found on screening mammography?: found on  Mammogram, Biopsy right breast:  03/12/2012,,UOQ, 03/25/12 biopsy Left and right  High grade ductal ca in situ Past/Anticipated interventions by surgeon, if any: august 2014 follow up with MD  Past/Anticipated interventions by medical oncology, if any: New Consult appt 08/19/12 with Dr.Khan Lymphedema issues, if any: no  Pain issues, if any: chronic, bchest area, had heart stress test treadmill yesterday, with Dr. Verdis Prime, passes stress test,MD feels muscle spasms SAFETY ISSUES:  Prior radiation?noPacemaker/ICD? no  Possible current pregnancy? no  Is the patient on methotrexate? no  Current Complaints / other details:  Chronic pain , ,diabetes type II  , sister who had breast cancer, deceased, age 46,  Had radiation , ,father deceased age 66,  with colon  cancer, no other cancers in family/abd hysterectomy, alert,oriented x3, well healed double mastectomy, steady gait, just tightness in chest area  At times, cleared by heart MD

## 2012-08-15 ENCOUNTER — Encounter: Payer: Self-pay | Admitting: Radiation Oncology

## 2012-08-15 ENCOUNTER — Ambulatory Visit: Admission: RE | Admit: 2012-08-15 | Payer: No Typology Code available for payment source | Source: Ambulatory Visit

## 2012-08-15 ENCOUNTER — Ambulatory Visit
Admission: RE | Admit: 2012-08-15 | Discharge: 2012-08-15 | Disposition: A | Discharge status: Home / Self Care | Payer: No Typology Code available for payment source | Source: Ambulatory Visit | Attending: Radiation Oncology | Admitting: Radiation Oncology

## 2012-08-15 VITALS — BP 144/71 | HR 76 | Temp 98.8°F | Resp 20 | Ht 62.5 in | Wt 260.5 lb

## 2012-08-15 DIAGNOSIS — C50911 Malignant neoplasm of unspecified site of right female breast: Secondary | ICD-10-CM

## 2012-08-15 HISTORY — DX: Allergy, unspecified, initial encounter: T78.40XA

## 2012-08-15 HISTORY — DX: Other chronic pain: G89.29

## 2012-08-15 NOTE — Progress Notes (Signed)
Patient scheduled with me in error. No charge for appointment.

## 2012-08-16 ENCOUNTER — Other Ambulatory Visit: Payer: Self-pay | Admitting: Emergency Medicine

## 2012-08-16 DIAGNOSIS — C50911 Malignant neoplasm of unspecified site of right female breast: Secondary | ICD-10-CM

## 2012-08-19 ENCOUNTER — Telehealth: Payer: Self-pay | Admitting: Oncology

## 2012-08-19 ENCOUNTER — Ambulatory Visit: Payer: BC Managed Care – PPO

## 2012-08-19 ENCOUNTER — Other Ambulatory Visit (HOSPITAL_BASED_OUTPATIENT_CLINIC_OR_DEPARTMENT_OTHER): Payer: BC Managed Care – PPO | Admitting: Lab

## 2012-08-19 ENCOUNTER — Encounter: Payer: Self-pay | Admitting: Oncology

## 2012-08-19 ENCOUNTER — Ambulatory Visit (HOSPITAL_BASED_OUTPATIENT_CLINIC_OR_DEPARTMENT_OTHER): Payer: No Typology Code available for payment source | Admitting: Oncology

## 2012-08-19 VITALS — BP 125/80 | HR 60 | Temp 98.6°F | Resp 20 | Ht 62.5 in | Wt 261.5 lb

## 2012-08-19 DIAGNOSIS — D0590 Unspecified type of carcinoma in situ of unspecified breast: Secondary | ICD-10-CM

## 2012-08-19 DIAGNOSIS — C50911 Malignant neoplasm of unspecified site of right female breast: Secondary | ICD-10-CM

## 2012-08-19 DIAGNOSIS — Z170 Estrogen receptor positive status [ER+]: Secondary | ICD-10-CM

## 2012-08-19 LAB — CBC WITH DIFFERENTIAL/PLATELET
Basophils Absolute: 0 10*3/uL (ref 0.0–0.1)
Eosinophils Absolute: 0 10*3/uL (ref 0.0–0.5)
HGB: 13.3 g/dL (ref 11.6–15.9)
LYMPH%: 31.4 % (ref 14.0–49.7)
MCV: 93.8 fL (ref 79.5–101.0)
MONO%: 8 % (ref 0.0–14.0)
NEUT#: 4 10*3/uL (ref 1.5–6.5)
Platelets: 246 10*3/uL (ref 145–400)

## 2012-08-19 LAB — COMPREHENSIVE METABOLIC PANEL (CC13)
ALT: 12 U/L (ref 0–55)
AST: 18 U/L (ref 5–34)
Calcium: 9.6 mg/dL (ref 8.4–10.4)
Chloride: 103 mEq/L (ref 98–107)
Creatinine: 0.8 mg/dL (ref 0.6–1.1)
Total Protein: 7.3 g/dL (ref 6.4–8.3)

## 2012-08-19 NOTE — Progress Notes (Signed)
Checked in new pt with no financial concerns. °

## 2012-08-19 NOTE — Patient Instructions (Addendum)
#  1 we discussed  pathology and rationale for treatment of breast cancer.  #2 we discussed since you have had double mastectomy for DCIS you do not need any form of systemic therapy.  #3 we discussed exercise and healthy eating.  #4 I will see you back in one years time for followup

## 2012-08-20 ENCOUNTER — Encounter: Payer: Self-pay | Admitting: Oncology

## 2012-08-20 NOTE — Progress Notes (Signed)
Krystal Watson [id number] [birth year] 67 y.o. 08/20/2012 11:09 PM  CC  Renford Dills D, MD [address] [place] [address] Dr. Cyndia Bent  Dr. Verdis Prime  Dr. Lurline Hare  REASON FOR CONSULTATION:  67 year old female with DCIS of the left breast status post bilateral mastectomies. Patient is seen in medical oncology for discussion of treatment options.  STAGE:   Breast cancer, right breast, UOQ, DCIS   Primary site: Breast (Right)   Staging method: AJCC 7th Edition   Clinical: Stage 0 (Tis, N0, cM0)   Summary: Stage 0 (Tis, N0, cM0)  REFERRING PHYSICIAN: Dr. Cyndia Bent  HISTORY OF PRESENT ILLNESS:  Krystal Watson is a 67 y.o. female.  We'll had a mammogram performed in November 2013 that showed abnormal calcifications in the right breast. Because of this she went on to have a diagnostic mammogram performed  That confirmed the area. She went on to have a stereotactic breast biopsy performed on 03/12/2012. This pathology revealed a ductal carcinoma in situ with comedonecrosis and calcifications. Tumor was ER negative PR negative. She was seen by Dr. Cyndia Bent in December 2013.she also had an MRI of the breasts performed that revealednonmass clumped linear enhancement located within the upper-outer quadrant of the right breast extending toward the nipple which measures 8.5 x 3.0 x 1.0 cm in size. The clip artifact related to the stereotactic biopsy is associated with the posterior portion of this enhancement and is located slightly lateral to the enhancement. The clumped linear enhancement is  significantly larger than the area of calcifications seen on mammography.multiple biopsies were obtained and patient only had multifocal DCIS. Because of this she opted for bilateral mastectomies. She had these performed in 04/24/2012. The final pathology on the left breast mastectomy specimen revealed a grade 31.2 cm ductal carcinoma in situ that was ER +32%  PR -2 sentinel nodes were negative for metastatic disease. There additional findings including fibrocystic change sclerosing adenosis intraductal papilloma and microcalcifications in benign ducts and lobules. The right mastectomy specimen revealed a grade 3 multifocal ductal carcinoma in situ measuring 2.6, 1.5, and 10.0 cm. One sentinel node was negative for metastatic disease. There was no evidence of invasive disease. The tumor was ER negative PR negative. Postoperatively she has done well she is now seen in medical oncology for discussion of treatment options. She has also been seen by Dr. Lurline Hare. Past Medical History: Past Medical History  Diagnosis Date  . Diabetes mellitus   . Diverticulosis   . Gallstones   . Hyperlipidemia   . Hypertension   . Obesity   . GERD (gastroesophageal reflux disease)   . Cancer     breast  . Shortness of breath     with exertion  . Arthritis   . Allergy     sulfa  . Chronic pain     Past Surgical History: Past Surgical History  Procedure Laterality Date  . Cholecystectomy    . Abdominal hysterectomy    . Colonoscopy    . Simple mastectomy with axillary sentinel node biopsy  04/24/2012    Procedure: SIMPLE MASTECTOMY WITH AXILLARY SENTINEL NODE BIOPSY;  Surgeon: Currie Paris, MD;  Location: [facility] OR;  Service: General;  Laterality: Bilateral;  Bilateral total mastectomy and bilateral sentinel node  . Breast surgery  04/24/12    mastectomy w/axillary slnbx  . Ercp      Hx 2x    Family History: Family History  Problem Relation Age of Onset  .  Breast cancer Sister   . Cancer Sister     breast  . Colon cancer Father 63  . Cancer Father     colon  . Diabetes Mother   . Heart disease Mother   . Esophageal cancer Neg Hx   . Stomach cancer Neg Hx   . Rectal cancer Neg Hx     Social History History  Substance Use Topics  . Smoking status: Never Smoker   . Smokeless tobacco: Never Used  . Alcohol Use: Yes     Comment: rare     Allergies: Allergies  Allergen Reactions  . Sulfonamide Derivatives     Pt can not remember the reaction    Current Medications: Current Outpatient Prescriptions  Medication Sig Dispense Refill  . acetaminophen (TYLENOL) 500 MG tablet Take 500 mg by mouth every 6 (six) hours as needed for pain.      [name] albuterol (PROVENTIL HFA;VENTOLIN HFA) 108 (90 BASE) MCG/ACT inhaler Inhale 2 puffs into the lungs every 6 (six) hours as needed. For shortness of breath      . aspirin 81 MG tablet Take 81 mg by mouth daily.      [name] atorvastatin (LIPITOR) 40 MG tablet Take 40 mg by mouth daily.      . Calcium Carbonate-Vitamin D (CALTRATE 600+D) 600-400 MG-UNIT per tablet Take 2 tablets by mouth 2 (two) times daily.       [name] CINNAMON PO Take 2 capsules by mouth daily.      [name] diltiazem (DILACOR XR) 240 MG 24 hr capsule Take 240 mg by mouth daily.      [name] esomeprazole (NEXIUM) 40 MG capsule Take 40 mg by mouth daily before breakfast.      . hydrochlorothiazide (HYDRODIURIL) 25 MG tablet Take 25 mg by mouth daily.      . metFORMIN (GLUCOPHAGE) 500 MG tablet Take 500 mg by mouth 2 (two) times daily.       . Multiple Vitamins-Minerals (ONE-A-DAY WEIGHT SMART ADVANCE) TABS Take 1 tablet by mouth daily.      . pioglitazone (ACTOS) 30 MG tablet Take 30 mg by mouth daily.      . ramipril (ALTACE) 10 MG tablet Take 10 mg by mouth daily.      . Alum Hydroxide-Mag Carbonate (GAVISCON PO) Take 2 tablets by mouth daily as needed. For stomach      . phenylephrine (SUDAFED PE) 10 MG TABS Take 10 mg by mouth every 4 (four) hours as needed. For allergies      . Probiotic Product (ALIGN) 4 MG CAPS Take 1 capsule by mouth daily.  14 capsule  0   No current facility-administered medications for this visit.    OB/GYN History:menarche at age 83 and underwent menopause in 34 she's had to live births first live birth at 73 she has now been on hormone replacement therapy  Fertility Discussion:she has completed her  family Prior History of Cancer:no  Health Maintenance:  Colonoscopyyes Bone Densityyes Last PAP smearNovember 2013  ECOG PERFORMANCE STATUS: 0 - Asymptomatic  Genetic Counseling/testing:no  REVIEW OF SYSTEMS:  A full comprehensive review of systems was obtained and it is reported separately in the electronic medical record  PHYSICAL EXAMINATION: Blood pressure 125/80, pulse 60, temperature 98.6 F (37 C), temperature source Oral, resp. rate 20, height 5' 2.5" (1.588 m), weight 261 lb 8 oz (118.616 kg).  Well-developed nourished female in no acute distress HEENT exam EOMI PERRLA sclerae anicteric no conjunctival pallor oral mucosa is moist  neck is supple lungs clear cardiovascular regular rhythm abdomen soft obese nontender nondistended bowel sounds present extremities +1 edema neuro patient's alert oriented otherwise nonfocal Bilateral mastectomy sites are healing well no evidence of local recurrence    STUDIES/RESULTS: No results found.   LABS:    Chemistry      Component Value Date/Time   NA 142 08/19/2012 1336   NA 138 04/25/2012 0605   K 4.0 08/19/2012 1336   K 3.4* 04/25/2012 0605   CL 103 08/19/2012 1336   CL 100 04/25/2012 0605   CO2 29 08/19/2012 1336   CO2 28 04/25/2012 0605   BUN 11.2 08/19/2012 1336   BUN 10 04/25/2012 0605   CREATININE 0.8 08/19/2012 1336   CREATININE 0.65 04/25/2012 0605      Component Value Date/Time   CALCIUM 9.6 08/19/2012 1336   CALCIUM 9.0 04/25/2012 0605   ALKPHOS 114 08/19/2012 1336   ALKPHOS 108 04/18/2012 1447   AST 18 08/19/2012 1336   AST 32 04/18/2012 1447   ALT 12 08/19/2012 1336   ALT 19 04/18/2012 1447   BILITOT 0.58 08/19/2012 1336   BILITOT 0.4 04/18/2012 1447      Lab Results  Component Value Date   WBC 6.7 08/19/2012   HGB 13.3 08/19/2012   HCT 41.1 08/19/2012   MCV 93.8 08/19/2012   PLT 246 08/19/2012   PATHOLOGY: ADDITIONAL INFORMATION: 4. PROGNOSTIC INDICATORS - ACIS Block 4J Results IMMUNOHISTOCHEMICAL AND  MORPHOMETRIC ANALYSIS BY THE AUTOMATED CELLULAR IMAGING SYSTEM (ACIS) Estrogen Receptor (Negative, <1%): 0%, NEGATIVE Progesterone Receptor (Negative, <1%): 0%, NEGATIVE COMMENT: The negative hormone receptor study(ies) in this case have an internal positive control. All controls stained appropriately Pecola Leisure MD Pathologist, Electronic Signature ( Signed 05/01/2012) 4. PROGNOSTIC INDICATORS - ACIS Block 4H Results IMMUNOHISTOCHEMICAL AND MORPHOMETRIC ANALYSIS BY THE AUTOMATED CELLULAR IMAGING SYSTEM (ACIS) Estrogen Receptor (Negative, <1%): 0%, NEGATIVE Progesterone Receptor (Negative, <1%): 0%, NEGATIVE COMMENT: The negative hormone receptor study(ies) in this case have an internal positive control. All controls stained appropriately Pecola Leisure MD 1 of 5 FINAL for DAISEE, CENTNER ([id number]) ADDITIONAL INFORMATION:(continued) Pathologist, Electronic Signature ( Signed 05/01/2012) 4. PROGNOSTIC INDICATORS - ACIS Block 4D Results IMMUNOHISTOCHEMICAL AND MORPHOMETRIC ANALYSIS BY THE AUTOMATED CELLULAR IMAGING SYSTEM (ACIS) Estrogen Receptor (Negative, <1%): 0%, NEGATIVE Progesterone Receptor (Negative, <1%): 0%, NEGATIVE COMMENT: The negative hormone receptor study(ies) in this case have an internal positive control. All controls stained appropriately Pecola Leisure MD Pathologist, Electronic Signature ( Signed 05/01/2012) FINAL DIAGNOSIS Diagnosis 1. Lymph node, sentinel, biopsy, Left axillary - ONE LYMPH NODE, NEGATIVE FOR TUMOR (0/1). 2. Lymph node, sentinel, biopsy, Right axillary - ONE LYMPH NODE, NEGATIVE FOR TUMOR (0/1). 3. Breast, simple mastectomy, Left - DUCTAL CARCINOMA IN SITU, GRADE III, SEE COMMENT. - IN SITU CARCINOMA IS 3.2 CM FROM NEAREST MARGIN (DEEP). - ONE LYMPH NODE, NEGATIVE FOR TUMOR (0/1). - SEE TUMOR TEMPLATE BELOW. 4. Breast, simple mastectomy, Right - TUMOR #1 (HOURGLASS TYPE CLIP - CENTRAL) - HIGH GRADE DUCTAL CARCINOMA IN SITU, SEE  COMMENT. - IN SITU CARCINOMA IS 4 MM FROM NEAREST MARGIN (DEEP). - TUMOR #2 (T-SHAPED CLIP - SUPERIOR / LATERAL) - HIGH GRADE DUCTAL CARCINOMA IN SITU, SEE COMMENT. - IN SITU CARCINOMA IS 2.2 CM FROM NEAREST MARGIN (DEEP) - TUMOR AREA #3 (NODULAR TISSUE BETWEEN CLIPS) - HIGH GRADE DUCTAL CARCINOMA IN SITU, SEE COMMENT. - IN SITU CARCINOMA IS 0.8 CM FROM NEAREST MARGIN Microscopic Comment 3. BREAST, IN SITU CARCINOMA Specimen, including laterality: Left breast Procedure: Simple  mastectomy 2 of 5 FINAL for AVORY, RAHIMI ([id number]) Microscopic Comment(continued) Grade of carcinoma: III of III Necrosis: Present Estimated tumor size: (gross measurement or glass slide measurement): multiple foci, the largest of which spans 1.2 cm, see comment Treatment effect: None If present, treatment effect in breast tissue, lymph nodes or both: N/A Distance to closest margin: 3.2 cm If margin positive, focally or broadly: N/A Breast prognostic profile: Repeated Estrogen receptor: Previous study demonstrated 32% positivity ([id number]) Progesterone receptor: Previous study demonstrated 0% positivity ([id number]) Lymph nodes: Examined: 2 Lymph nodes with metastasis: 0 TNM: pTis, pN0 Comments: The entire previous biopsy cavity and surround indurate tissues (4.0cm) were submitted for review. There are multiple foci of in situ carcinoma present through out the sections examined; the largest of which spans 1.2cm. Additional findings include fibrocystic change, previous biopsy site change, sclerosing adenosis, intraductal papilloma, and microcalcifications in benign ducts and lobules. 4. BREAST, IN SITU CARCINOMA Specimen, including laterality: Right breast Procedure: Mastectomy Grade of carcinoma: III of III Necrosis: Present Estimated tumor size: (gross measurement or glass slide measurement): 2.6, 1.5 and 10.0 cm, see comment Treatment effect: None If present, treatment effect in breast  tissue, lymph nodes or both: N/A Distance to closest margin: 4 mm (deep) If margin positive, focally or broadly: N/A Breast prognostic profile: Repeated for tumor #1, 2, 3 Estrogen receptor: Tumor #1 and 2 - previous study demonstrated 0% positivity ([id number] and [id number]) Progesterone receptor: Tumor #1 and 2 - previous study demonstrated 0% positivity ([id number] and [id number]) Lymph nodes: Examined: 1 Lymph nodes with metastasis: 0 TNM: pTis, pN0 Comments: The tissue surrounding the hourglass and T-shaped clips as well as the nodular tissue between the clips was entirely submitted for review. On review, there are numerous foci of high grade DCIS spanning the entire 2.6 and 1.5 cm biopsy cavities, respectively. Sections of tissue between the two clips (spanning 10cm) demonstrate high grade ductal carcinoma in situ with comedo necrosis and calcifications in essentially every tissue section. Preservation of the myoepithelial layer was confirmed with calponin, smooth muscle myosin heavy chain, and p63 immunostains. Given that the in situ carcinoma within the nodular tissue is grossly and microscopically contiguous with the in situ carcinoma surrounding the hourglass and T-shaped clips, the entire span of in situ carcinoma includes all three areas (e.g. at least 12cm). (CRR:caf 04/26/12) [place] RUND DO Pathologist, Electronic Signature (Case signed 04/29/2012) 3 of  ASSESSMENT    67 year old female with  #1 bilateral breast cancers, DCIS high grade. Right breast with multifocal DCIS left with a single focus of DCIS. Left breast DCIS was ER positive PR negative right breast DCIS ER negative PR negative. Patient is now status post bilateral mastectomies. She has been seen by radiation oncology and she will not receive radiation because of mastectomies. She has also been seen by plastic surgery and patient will eventually consider having bilateral reconstructions maybe in one years  time.  #2 patient and I discussed adjuvant treatment in the setting of noninvasive disease. Although the left breast ER receptor was positive at about 32% certainly she would be a candidate however I am uncertain whether this would be of any significant benefit versus risk. We discussed this. I do not think that she needs adjuvant therapy systemically at this time since she had no evidence of  invasive disease.  Clinical Trial Eligibility:no Multidisciplinary conference discussion yes     PLAN:    #1 I will continue to see the patient on a yearly basis.  #2  she knows to call with any problems        Discussion: Patient is being treated per NCCN breast cancer care guidelines appropriate for stage.0   Thank you so much for allowing me to participate in the care of Lorenda Hatchet. I will continue to follow up the patient with you and assist in her care.  All questions were answered. The patient knows to call the clinic with any problems, questions or concerns. We can certainly see the patient much sooner if necessary.  I spent 45 minutes counseling the patient face to face. The total time spent in the appointment was 60 minutes.  Drue Second, MD Medical/Oncology [facility] [phone] (beeper) [phone] (Office)  08/20/2012, 11:09 PM

## 2012-08-22 ENCOUNTER — Telehealth (INDEPENDENT_AMBULATORY_CARE_PROVIDER_SITE_OTHER): Payer: Self-pay | Admitting: General Surgery

## 2012-08-22 NOTE — Telephone Encounter (Signed)
Calling to get a verbal order for patient to have silicone prosthesis to use while swimming. I gave the verbal okay per Dr Jamey Ripa. They will call with any other questions.

## 2012-10-12 ENCOUNTER — Encounter: Payer: Self-pay | Admitting: *Deleted

## 2012-10-12 NOTE — Progress Notes (Signed)
Mailed after appt letter to pt. 

## 2012-11-06 ENCOUNTER — Encounter (INDEPENDENT_AMBULATORY_CARE_PROVIDER_SITE_OTHER): Payer: Self-pay | Admitting: Surgery

## 2012-11-06 ENCOUNTER — Ambulatory Visit (INDEPENDENT_AMBULATORY_CARE_PROVIDER_SITE_OTHER): Payer: Medicare PPO | Admitting: Surgery

## 2012-11-06 VITALS — BP 132/74 | HR 68 | Temp 98.1°F | Resp 15 | Ht 62.5 in | Wt 262.0 lb

## 2012-11-06 DIAGNOSIS — Z853 Personal history of malignant neoplasm of breast: Secondary | ICD-10-CM

## 2012-11-06 NOTE — Patient Instructions (Signed)
See me again in December 

## 2012-11-06 NOTE — Progress Notes (Signed)
NAME: Krystal Watson       DOB: 1945-07-26           DATE: 11/06/2012       MRN: [id number]  CC:   Chief Complaint  Patient presents with  . Breast Cancer Long Term Follow Up    Krystal Watson is a 67 y.o.Marland Kitchenfemale who presents for routine followup of her Bilateral DCIS  diagnosed in 2013 and treated with Bilateral mastectomy in Jan 2014. She has no problems or concerns on either side.She is still thinking about a reconstruction next year  PFSH: She has had no significant changes since the last visit here.  ROS: There have been no significant changes since the last visit here  EXAM:  VS: BP 132/74  Pulse 68  Temp(Src) 98.1 F (36.7 C) (Temporal)  Resp 15  Ht 5' 2.5" (1.588 m)  Wt 262 lb (118.842 kg)  BMI 47.13 kg/m2  General: The patient is alert, oriented, generally healthy appearing, NAD. Mood and affect are normal.  Breasts:  S/P Bil mastectomy. Some exces tissue bilaterally but no evidence recurrence  Lymphatics: She has no axillary or supraclavicular adenopathy on either side.  Extremities: Full ROM of the surgical side with no lymphedema noted.  Data Reviewed: Dr Milta Deiters note  Impression: Doing well, with no evidence of recurrent cancer or new cancer  Plan: Will continue to follow up in December

## 2013-01-07 ENCOUNTER — Telehealth (INDEPENDENT_AMBULATORY_CARE_PROVIDER_SITE_OTHER): Payer: Self-pay

## 2013-01-07 NOTE — Telephone Encounter (Signed)
LMOM asking pt if she would like to r/s her appointment time from 450pm to 200pm.  Hopefully pt will call office.

## 2013-01-08 ENCOUNTER — Encounter (INDEPENDENT_AMBULATORY_CARE_PROVIDER_SITE_OTHER): Payer: Self-pay | Admitting: Surgery

## 2013-01-08 ENCOUNTER — Ambulatory Visit (INDEPENDENT_AMBULATORY_CARE_PROVIDER_SITE_OTHER): Payer: Medicare PPO | Admitting: Surgery

## 2013-01-08 VITALS — BP 128/72 | HR 68 | Resp 16 | Ht 62.5 in | Wt 264.6 lb

## 2013-01-08 DIAGNOSIS — R229 Localized swelling, mass and lump, unspecified: Secondary | ICD-10-CM

## 2013-01-08 DIAGNOSIS — R2231 Localized swelling, mass and lump, right upper limb: Secondary | ICD-10-CM

## 2013-01-08 NOTE — Patient Instructions (Signed)
We will schedule an ultrasound of the right axillary area in the spot you can feel a lump. If you haven't heard a time for this to be done by noon on Friday, call my nurse, Meagan, at [phone]

## 2013-01-08 NOTE — Progress Notes (Signed)
Krystal Watson       DOB: 28-Sep-1945           DATE: 01/08/2013       ZOX:[id number]  CC:  Chief Complaint  Patient presents with  . Breast Cancer Long Term Follow Up    HPI: she is status post bilateral mastectomies with sentinel nodes for DCIS. I saw her a few months ago and she was doing well. She comes to the office today because she feels an abnormality in the right axillary area that she wished me to check. Is not particularly painful. She's not had any other symptoms.  EXAM: Vital signs: BP 128/72  Pulse 68  Resp 16  Ht 5' 2.5" (1.588 m)  Wt 264 lb 9.6 oz (120.022 kg)  BMI 47.59 kg/m2  General: Patient alert, oriented, NAD  Breasts: She is status post bilateral total mastectomies. There is a significant amount of excess skin but she is planning reconstructions.  Lymphatics: The area she points to as abnormal is in the right axilla somewhat posteriorly when she has her hand and arm extended over her head. This may be a lymph node but I'm not 100% sure again feel a definite mass. There is skin abnormalities or anything else to suggest cancer. IMP: right axillary mass possible with discomfort  PLAN: we are going to get an ultrasound just to be sure there is not something that needs further evaluation. I discussed that with her. We will set this up to be done at the breast center.  Tangi Shroff J 01/08/2013

## 2013-02-28 ENCOUNTER — Telehealth: Payer: Self-pay | Admitting: *Deleted

## 2013-02-28 NOTE — Telephone Encounter (Signed)
sw pt informed her that kk will be in [facility] during the am. gv appt for 08/13/13 @ 11:30am..td

## 2013-03-10 ENCOUNTER — Ambulatory Visit (INDEPENDENT_AMBULATORY_CARE_PROVIDER_SITE_OTHER): Payer: Medicare PPO | Admitting: Surgery

## 2013-03-10 ENCOUNTER — Encounter (INDEPENDENT_AMBULATORY_CARE_PROVIDER_SITE_OTHER): Payer: Self-pay | Admitting: Surgery

## 2013-03-10 VITALS — BP 128/82 | HR 80 | Temp 97.0°F | Resp 14 | Ht 63.0 in | Wt 263.6 lb

## 2013-03-10 DIAGNOSIS — Z853 Personal history of malignant neoplasm of breast: Secondary | ICD-10-CM

## 2013-03-10 NOTE — Patient Instructions (Signed)
Continue to see [place] in December

## 2013-03-10 NOTE — Progress Notes (Signed)
NAME: Krystal Watson       DOB: Jun 30, 1945           DATE: 03/10/2013       MRN: [id number]  CC:   Chief Complaint  Patient presents with  . Breast Cancer Long Term Follow Up    LTFU 4 mo br rechck    Krystal Watson is a 68 y.o.Marland Kitchenfemale who presents for routine followup of her Bilateral DCIS  diagnosed in 2013 and treated with Bilateral mastectomy in Jan 2014. She has no problems or concerns on either side.She is still thinking about a reconstruction next year. I saw her in October with a question of an abnormality in the right axilla. She was to have an ultrasoound done, but that apparently did not happen. She notes no abnormality now.  PFSH: She has had no significant changes since the last visit here.  ROS: There have been no significant changes since the last visit here  EXAM:  VS: BP 128/82  Pulse 80  Temp(Src) 97 F (36.1 C) (Temporal)  Resp 14  Ht 5\' 3"  (1.6 m)  Wt 263 lb 9.6 oz (119.568 kg)  BMI 46.71 kg/m2  General: The patient is alert, oriented, generally healthy appearing, NAD. Mood and affect are normal.  Breasts:  S/P Bil mastectomy. Some excess tissue bilaterally but no evidence recurrence.  Lymphatics: She has no axillary or supraclavicular adenopathy on either side.  Extremities: Full ROM of the surgical side with no lymphedema noted.  Data Reviewed: No new data  Impression: Doing well, with no evidence of recurrent cancer or new cancer  Plan: Will continue to follow up in December. She sees Dr Welton Flakes in May

## 2013-03-26 ENCOUNTER — Ambulatory Visit: Payer: Self-pay | Admitting: Podiatry

## 2013-04-09 ENCOUNTER — Ambulatory Visit (INDEPENDENT_AMBULATORY_CARE_PROVIDER_SITE_OTHER): Payer: Medicare Other | Admitting: Podiatry

## 2013-04-09 ENCOUNTER — Ambulatory Visit (INDEPENDENT_AMBULATORY_CARE_PROVIDER_SITE_OTHER): Payer: Medicare Other

## 2013-04-09 ENCOUNTER — Encounter: Payer: Self-pay | Admitting: Podiatry

## 2013-04-09 VITALS — BP 183/88 | HR 79 | Resp 18 | Ht 62.0 in | Wt 263.0 lb

## 2013-04-09 DIAGNOSIS — M898X9 Other specified disorders of bone, unspecified site: Secondary | ICD-10-CM

## 2013-04-09 DIAGNOSIS — L84 Corns and callosities: Secondary | ICD-10-CM

## 2013-04-09 DIAGNOSIS — M779 Enthesopathy, unspecified: Secondary | ICD-10-CM

## 2013-04-09 DIAGNOSIS — M2040 Other hammer toe(s) (acquired), unspecified foot: Secondary | ICD-10-CM

## 2013-04-09 MED ORDER — TRIAMCINOLONE ACETONIDE 10 MG/ML IJ SUSP
10.0000 mg | Freq: Once | INTRAMUSCULAR | Status: AC
  Administered 2013-04-09: 10 mg

## 2013-04-09 NOTE — Patient Instructions (Signed)
Corns and Calluses A thickening of the skin layer (usually over bony areas, such as toe joints) is known as a corn. Two types of corns exist: hard corns and soft corns. Calluses are painless areas of skin thickening that are caused by repeated pressure or irritation. Corns tend to affect toe joints and the skin between the toes; whereas, a callus can appear on any part of the body (especially the hands, feet, or knees).  SYMPTOMS   Corn:  Presence of a small (1/8 to 3/8 inch [3 to 10 mm in diameter]), painful bump on the side or over the joint of a toe.  Hard corns are more common on the outer portion of the little (fifth) toe at the joint.  Soft corns are more common between bony bumps (prominences), usually between the fourth and fifth toes or between the second and third toes.  Callus:  A rough, thickened area of skin that appears after repeated pressure or irritation. CAUSES  The purpose of corns and calluses is to protect an area of skin from injury caused by repeated irritation (rubbing or squeezing). The presence of pressure causes the skin cells to grow at a faster rate than the cells of unaffected areas. This leads to an overgrowth (corn or callus). As apposed to hard corns, soft corns tend to develop between toes, because there is more moisture. Soft corns are often the result of prolonged shoe wear, which leads to increased perspiration and moisture.  RISK INCREASES WITH:  Shoes that are too tight.  Occupations or sports that involve repetitive pressure on the hands (racquetball and baseball) or sudden stops on hard surfaces (track and tennis).  Sports that require the athlete to wear shoes, perspire, or wear clothing or protective gear that causes the production of heat and friction. PREVENTION  Properly fitted shoes and equipment.  Modify activities to prevent constant pressure on specific areas of skin.  If possible, wear padding over areas of skin that are exposed to  repeated pressure or irritation.  Keep the area between the toes dry (with powder or by removing shoes often).  Relieve shoe pressure by stretching the areas of the shoe that cause the pressure and or use ointments to soften leather shoes. PROGNOSIS  Corns and calluses typically subside if the activity that causes them is eliminated. Recovery may take up to 3 weeks. Recurrence is likely even with treatment if the cause is not removed.  RELATED COMPLICATIONS  If one overcompensates in an attempt to avoid pain, he or she may experience pain in other areas due to the changes in body movements (mechanics). TREATMENT  The best way to treat corns and calluses is to remove the source of pressure. Corn and callus pads may be helpful in reducing pressure on the affected skin. For soft corns, try to keep the affected area dry. If you cannot find shoes that fit properly, a shoe repair shop may be able to alter your shoes to reduce pressure. Occasionally a cushion for the bottom of the foot (metatarsal bar) worn within the shoe may relieve pressure on corns or calluses of the foot. For calluses, you may be able to peel or rub the thickened area with a pumice stone, sandstone, callus file, or with sandpaper to remove the callus; wetting the affected area may make this process more effective. Do not cut the corn or callus with a razor or knife. If the corn or callus must be removed, then a medically trained person should perform   the procedure. After peeling away the upper layers of a corn once or twice a day, it may be recommended to apply a non-prescription 5% to 10% salicylic ointment and cover the area with a bandage. It very uncommon to have the bony bumps (at toe joints) surgically removed. MEDICATION   If pain medication is necessary, nonsteroidal anti-inflammatory medications, such as aspirin and ibuprofen, or other minor pain relievers, such as acetaminophen, are often recommended. Contact your caregiver  immediately if any bleeding, stomach upset, or signs of an allergic reaction occur.  Topical salicylic ointments (5% to 10%) may be of benefit.  Prescription pain medications may be given by a caregiver. Use only as directed and only as much as you need.  Soak the foot for 20 minutes, twice a day, in a gallon of warm water. This may help to soften corns and calluses. Care should be taken to thoroughly dry the foot, especially between the toes, after soaking. SEEK MEDICAL CARE IF:   Symptoms get worse or do not improve in 2 weeks despite treatment.  Any signs of infection develop, including redness, swelling, increased pain or tenderness, or increased warmth around the corn or callus.  New, unexplained symptoms develop (drugs used in treatment may produce side effects). Document Released: 03/20/2005 Document Revised: 06/12/2011 Document Reviewed: 07/02/2008 [employer] Patient Information 2014 [employer].  

## 2013-04-09 NOTE — Progress Notes (Signed)
   Subjective:    Patient ID: Krystal Watson, female    DOB: 1945-12-07, 68 y.o.   MRN: [id number]  HPI Comments: "I have a place between my two toes"  Pt has painful corn on lateral side of 4th toe right, between 4th and 5th. Been there about 2 mos. Says she has tried vaseline, with no help. Says shoes are sometimes uncomfortable. Area getting worse.     Review of Systems  All other systems reviewed and are negative.       Objective:   Physical Exam        Assessment & Plan:

## 2013-04-09 NOTE — Progress Notes (Signed)
Subjective:     Patient ID: Krystal Watson, female   DOB: [birth year], 68 y.o.   MRN: [id number]  HPI patient presents with extreme pain on the right fourth toe which has been inflamed and hard for her to wear shoe gear with. She has tried Vaseline padding without relief of symptoms   Review of Systems  All other systems reviewed and are negative.       Objective:   Physical Exam  Nursing note and vitals reviewed. Constitutional: She is oriented to person, place, and time.  Cardiovascular: Intact distal pulses.   Musculoskeletal: Normal range of motion.  Neurological: She is oriented to person, place, and time.  Skin: Skin is warm.   neurovascular status intact with muscle strength adequate and no equinus condition noted. Keratotic lesion lateral side interphalangeal joint fourth right with inflammation and fluid buildup underlying the area. Digital deformity of the fifth and fourth toes right     Assessment:     Capsulitis with inflammation of the interphalangeal joint fourth right with overlying keratotic lesion secondary to digital structure    Plan:     H&P and x-rays reviewed. Did careful interphalangeal joint 2 mg dexamethasone Kenalog 2 mg Xylocaine and after appropriate numbness debrided lesion with no iatrogenic bleeding noted and applied pad. Reappoint when symptomatic and may need surgery if this does not improve

## 2013-04-18 ENCOUNTER — Other Ambulatory Visit (INDEPENDENT_AMBULATORY_CARE_PROVIDER_SITE_OTHER): Payer: Self-pay | Admitting: *Deleted

## 2013-04-18 MED ORDER — UNABLE TO FIND: Status: DC

## 2013-05-23 ENCOUNTER — Encounter: Payer: Self-pay | Admitting: *Deleted

## 2013-08-05 ENCOUNTER — Telehealth: Payer: Self-pay | Admitting: Oncology

## 2013-08-05 NOTE — Telephone Encounter (Signed)
, °

## 2013-08-13 ENCOUNTER — Ambulatory Visit: Payer: BC Managed Care – PPO | Admitting: Oncology

## 2013-08-20 ENCOUNTER — Ambulatory Visit: Payer: BC Managed Care – PPO | Admitting: Oncology

## 2013-09-12 ENCOUNTER — Ambulatory Visit (HOSPITAL_BASED_OUTPATIENT_CLINIC_OR_DEPARTMENT_OTHER): Payer: 59 | Admitting: Hematology and Oncology

## 2013-09-12 ENCOUNTER — Telehealth: Payer: Self-pay | Admitting: Hematology and Oncology

## 2013-09-12 VITALS — BP 146/81 | HR 81 | Temp 98.4°F | Resp 18 | Ht 62.0 in | Wt 266.0 lb

## 2013-09-12 DIAGNOSIS — D0590 Unspecified type of carcinoma in situ of unspecified breast: Secondary | ICD-10-CM

## 2013-09-12 DIAGNOSIS — Z170 Estrogen receptor positive status [ER+]: Secondary | ICD-10-CM

## 2013-09-12 DIAGNOSIS — C50911 Malignant neoplasm of unspecified site of right female breast: Secondary | ICD-10-CM

## 2013-09-12 NOTE — Progress Notes (Signed)
Krystal Watson [id number] 01-Apr-1946 68 y.o. 09/12/2013 10:41 AM  CC  Kandice Hams, MD [address] [place] [address] Dr. Neldon Mc  Dr. Daneen Schick  Dr. Thea Silversmith  Chief complaint: Followup visit for DCIS  Diagnosis:  DCIS of the left breast status post bilateral mastectomies.  STAGE:   Breast cancer, right breast, UOQ, DCIS   Primary site: Breast (Right)   Staging method: AJCC 7th Edition   Clinical: Stage 0 (Tis, N0, cM0)   Summary: Stage 0 (Tis, N0, cM0)  REFERRING PHYSICIAN: Dr. Neldon Mc  HISTORY OF PRESENT ILLNESS: As per previously dictated note:  Krystal Watson is a 68 y.o. female.  We'll had a mammogram performed in November 2013 that showed abnormal calcifications in the right breast. Because of this she went on to have a diagnostic mammogram performed  That confirmed the area. She went on to have a stereotactic breast biopsy performed on 03/12/2012. This pathology revealed a ductal carcinoma in situ with comedonecrosis and calcifications. Tumor was ER negative PR negative. She was seen by Dr. Neldon Mc in December 2013.she also had an MRI of the breasts performed that revealednonmass clumped linear enhancement located within the upper-outer quadrant of the right breast extending toward the nipple which measures 8.5 x 3.0 x 1.0 cm in size. The clip artifact related to the stereotactic biopsy is associated with the posterior portion of this enhancement and is located slightly lateral to the enhancement. The clumped linear enhancement is  significantly larger than the area of calcifications seen on mammography.multiple biopsies were obtained and patient only had multifocal DCIS. Because of this she opted for bilateral mastectomies. She had these performed in 04/24/2012. The final pathology on the left breast mastectomy specimen revealed a grade 31.2 cm ductal carcinoma in situ that was ER +32% PR -2 sentinel nodes were  negative for metastatic disease. There additional findings including fibrocystic change sclerosing adenosis intraductal papilloma and microcalcifications in benign ducts and lobules. The right mastectomy specimen revealed a grade 3 multifocal ductal carcinoma in situ measuring 2.6, 1.5, and 10.0 cm. One sentinel node was negative for metastatic disease. There was no evidence of invasive disease. The tumor was ER negative PR negative. Postoperatively she has done well she is now seen in medical oncology for discussion of treatment options. She has also been seen by Dr. Thea Silversmith.  Interval history:  Krystal Watson  is a 68 years old pleasant female with history of bilateral DCIS, status post bilateral mastectomies is here for followup visit today.  she says she gained some weight and not doing active exercise. She says she likes to eat especially meat. She denies any shortness of breath, chest pain, palpitations, denies any fever, decrease in appetite, headaches or blurred vision. Denies any constipation blood in the stool blood in the urine. She complains of hot flashes intermittently and also joint pains. She is up-to-date with her cancer screening tests. She is actively followed by primary care physician for her medical issues  Past Medical History: Past Medical History  Diagnosis Date  . Diabetes mellitus   . Diverticulosis   . Gallstones   . Hyperlipidemia   . Hypertension   . Obesity   . GERD (gastroesophageal reflux disease)   . Cancer     breast  . Shortness of breath     with exertion  . Arthritis   . Allergy     sulfa  . Chronic pain     Past  Surgical History: Past Surgical History  Procedure Laterality Date  . Cholecystectomy    . Abdominal hysterectomy    . Colonoscopy    . Simple mastectomy with axillary sentinel node biopsy  04/24/2012    Procedure: SIMPLE MASTECTOMY WITH AXILLARY SENTINEL NODE BIOPSY;  Surgeon: Haywood Lasso, MD;  Location: [facility];  Service:  General;  Laterality: Bilateral;  Bilateral total mastectomy and bilateral sentinel node  . Ercp      Hx 2x    Family History: Family History  Problem Relation Age of Onset  . Breast cancer Sister   . Colon cancer Father 36  . Diabetes Mother   . Heart disease Mother   . Esophageal cancer Neg Hx   . Stomach cancer Neg Hx   . Rectal cancer Neg Hx     Social History History  Substance Use Topics  . Smoking status: Never Smoker   . Smokeless tobacco: Never Used  . Alcohol Use: Yes     Comment: rare    Allergies: Allergies  Allergen Reactions  . Sulfonamide Derivatives     Pt can not remember the reaction    Current Medications: Current Outpatient Prescriptions  Medication Sig Dispense Refill  . acetaminophen (TYLENOL) 500 MG tablet Take 500 mg by mouth every 6 (six) hours as needed for pain.      [name] albuterol (PROVENTIL HFA;VENTOLIN HFA) 108 (90 BASE) MCG/ACT inhaler Inhale 2 puffs into the lungs every 6 (six) hours as needed. For shortness of breath      . Alum Hydroxide-Mag Carbonate (GAVISCON PO) Take 2 tablets by mouth daily as needed. For stomach      . aspirin 81 MG tablet Take 81 mg by mouth daily.      [name] atorvastatin (LIPITOR) 40 MG tablet Take 40 mg by mouth daily.      . Calcium Carbonate-Vitamin D (CALTRATE 600+D) 600-400 MG-UNIT per tablet Take 2 tablets by mouth 2 (two) times daily.       [name] CINNAMON PO Take 2 capsules by mouth daily.      [name] diltiazem (DILACOR XR) 240 MG 24 hr capsule Take 240 mg by mouth daily.      [name] esomeprazole (NEXIUM) 40 MG capsule Take 40 mg by mouth daily before breakfast.      . hydrochlorothiazide (HYDRODIURIL) 25 MG tablet Take 25 mg by mouth daily.      [name] ibandronate (BONIVA) 150 MG tablet Take 150 mg by mouth every 30 (thirty) days. Take in the morning with a full glass of water, on an empty stomach, and do not take anything else by mouth or lie down for the next 30 min.      . metFORMIN (GLUCOPHAGE) 500 MG tablet Take 500 mg by  mouth 2 (two) times daily.       . Multiple Vitamins-Minerals (ONE-A-DAY WEIGHT SMART ADVANCE) TABS Take 1 tablet by mouth daily.      . pioglitazone (ACTOS) 30 MG tablet Take 30 mg by mouth daily.      . Probiotic Product (ALIGN) 4 MG CAPS Take 1 capsule by mouth daily.  14 capsule  0  . ramipril (ALTACE) 10 MG tablet Take 10 mg by mouth daily.      [name] UNABLE TO FIND Rx: L8000-Post Surgical Bras (Quantity: 6) Q7619- Silicone Breast Prosthesis (Quantity: 2) Dx: 174.9; bilateral mastectomies  1 each  0  . phenylephrine (SUDAFED PE) 10 MG TABS Take 10 mg by mouth every 4 (four) hours as  needed. For allergies       No current facility-administered medications for this visit.    OB/GYN History:menarche at age 28 and underwent menopause in 58 she's had to live births first live birth at 33 she has now been on hormone replacement therapy  Fertility Discussion:she has completed her family Prior History of Cancer:no  Health Maintenance:  Colonoscopyyes Bone Densityyes Last PAP smearNovember 2013  ECOG PERFORMANCE STATUS: 0 - Asymptomatic  Genetic Counseling/testing:no  REVIEW OF SYSTEMS: A detailed 10 point review of systems is been assessed and pertinent symptoms as mentioned in interval history  PHYSICAL EXAMINATION: Blood pressure 146/81, pulse 81, temperature 98.4 F (36.9 C), temperature source Oral, resp. rate 18, height 5\' 2"  (1.575 m), weight 266 lb (120.657 kg).  Obese not in acute distress  HEENT exam EOMI PERRLA, sclerae anicteric, no conjunctival pallor, oral mucosa is moist, neck is supple,  lungs: clear to auscultation no rales  cardiovascular regular rhythm  abdomen soft obese nontender nondistended bowel sounds present  extremities +1 edema  neuro patient's alert oriented otherwise nonfocal Bilateral mastectomy sites well healed, no evidence of local recurrence, no bilateral axillary lymphadenopathy Psychological: Normal mood and affect Skin: Intact no petechia or  purpura noted    STUDIES/RESULTS: No results found.   LABS:    Chemistry      Component Value Date/Time   NA 142 08/19/2012 1336   NA 138 04/25/2012 0605   K 4.0 08/19/2012 1336   K 3.4* 04/25/2012 0605   CL 103 08/19/2012 1336   CL 100 04/25/2012 0605   CO2 29 08/19/2012 1336   CO2 28 04/25/2012 0605   BUN 11.2 08/19/2012 1336   BUN 10 04/25/2012 0605   CREATININE 0.8 08/19/2012 1336   CREATININE 0.65 04/25/2012 0605      Component Value Date/Time   CALCIUM 9.6 08/19/2012 1336   CALCIUM 9.0 04/25/2012 0605   ALKPHOS 114 08/19/2012 1336   ALKPHOS 108 04/18/2012 1447   AST 18 08/19/2012 1336   AST 32 04/18/2012 1447   ALT 12 08/19/2012 1336   ALT 19 04/18/2012 1447   BILITOT 0.58 08/19/2012 1336   BILITOT 0.4 04/18/2012 1447      Lab Results  Component Value Date   WBC 6.7 08/19/2012   HGB 13.3 08/19/2012   HCT 41.1 08/19/2012   MCV 93.8 08/19/2012   PLT 246 08/19/2012   PATHOLOGY: ADDITIONAL INFORMATION: 4. PROGNOSTIC INDICATORS - ACIS Block 4J Results IMMUNOHISTOCHEMICAL AND MORPHOMETRIC ANALYSIS BY THE AUTOMATED CELLULAR IMAGING SYSTEM (ACIS) Estrogen Receptor (Negative, <1%): 0%, NEGATIVE Progesterone Receptor (Negative, <1%): 0%, NEGATIVE COMMENT: The negative hormone receptor study(ies) in this case have an internal positive control. All controls stained appropriately Enid Cutter MD Pathologist, Electronic Signature ( Signed 05/01/2012) 4. PROGNOSTIC INDICATORS - ACIS Block 4H Results IMMUNOHISTOCHEMICAL AND MORPHOMETRIC ANALYSIS BY THE AUTOMATED CELLULAR IMAGING SYSTEM (ACIS) Estrogen Receptor (Negative, <1%): 0%, NEGATIVE Progesterone Receptor (Negative, <1%): 0%, NEGATIVE COMMENT: The negative hormone receptor study(ies) in this case have an internal positive control. All controls stained appropriately Enid Cutter MD 1 of 5 FINAL for Krystal Watson, Krystal Watson ([id number]) ADDITIONAL INFORMATION:(continued) Pathologist, Electronic Signature ( Signed 05/01/2012) 4.  PROGNOSTIC INDICATORS - ACIS Block 4D Results IMMUNOHISTOCHEMICAL AND MORPHOMETRIC ANALYSIS BY THE AUTOMATED CELLULAR IMAGING SYSTEM (ACIS) Estrogen Receptor (Negative, <1%): 0%, NEGATIVE Progesterone Receptor (Negative, <1%): 0%, NEGATIVE COMMENT: The negative hormone receptor study(ies) in this case have an internal positive control. All controls stained appropriately Enid Cutter MD Pathologist, Electronic Signature ( Signed 05/01/2012)  FINAL DIAGNOSIS Diagnosis 1. Lymph node, sentinel, biopsy, Left axillary - ONE LYMPH NODE, NEGATIVE FOR TUMOR (0/1). 2. Lymph node, sentinel, biopsy, Right axillary - ONE LYMPH NODE, NEGATIVE FOR TUMOR (0/1). 3. Breast, simple mastectomy, Left - DUCTAL CARCINOMA IN SITU, GRADE III, SEE COMMENT. - IN SITU CARCINOMA IS 3.2 CM FROM NEAREST MARGIN (DEEP). - ONE LYMPH NODE, NEGATIVE FOR TUMOR (0/1). - SEE TUMOR TEMPLATE BELOW. 4. Breast, simple mastectomy, Right - TUMOR #1 (HOURGLASS TYPE CLIP - CENTRAL) - HIGH GRADE DUCTAL CARCINOMA IN SITU, SEE COMMENT. - IN SITU CARCINOMA IS 4 MM FROM NEAREST MARGIN (DEEP). - TUMOR #2 (T-SHAPED CLIP - SUPERIOR / LATERAL) - HIGH GRADE DUCTAL CARCINOMA IN SITU, SEE COMMENT. - IN SITU CARCINOMA IS 2.2 CM FROM NEAREST MARGIN (DEEP) - TUMOR AREA #3 (NODULAR TISSUE BETWEEN CLIPS) - HIGH GRADE DUCTAL CARCINOMA IN SITU, SEE COMMENT. - IN SITU CARCINOMA IS 0.8 CM FROM NEAREST MARGIN Microscopic Comment 3. BREAST, IN SITU CARCINOMA Specimen, including laterality: Left breast Procedure: Simple mastectomy 2 of 5 FINAL for Krystal Watson, Krystal Watson ([id number]) Microscopic Comment(continued) Grade of carcinoma: III of III Necrosis: Present Estimated tumor size: (gross measurement or glass slide measurement): multiple foci, the largest of which spans 1.2 cm, see comment Treatment effect: None If present, treatment effect in breast tissue, lymph nodes or both: N/A Distance to closest margin: 3.2 cm If margin positive, focally or  broadly: N/A Breast prognostic profile: Repeated Estrogen receptor: Previous study demonstrated 32% positivity ([id number]) Progesterone receptor: Previous study demonstrated 0% positivity ([id number]) Lymph nodes: Examined: 2 Lymph nodes with metastasis: 0 TNM: pTis, pN0 Comments: The entire previous biopsy cavity and surround indurate tissues (4.0cm) were submitted for review. There are multiple foci of in situ carcinoma present through out the sections examined; the largest of which spans 1.2cm. Additional findings include fibrocystic change, previous biopsy site change, sclerosing adenosis, intraductal papilloma, and microcalcifications in benign ducts and lobules. 4. BREAST, IN SITU CARCINOMA Specimen, including laterality: Right breast Procedure: Mastectomy Grade of carcinoma: III of III Necrosis: Present Estimated tumor size: (gross measurement or glass slide measurement): 2.6, 1.5 and 10.0 cm, see comment Treatment effect: None If present, treatment effect in breast tissue, lymph nodes or both: N/A Distance to closest margin: 4 mm (deep) If margin positive, focally or broadly: N/A Breast prognostic profile: Repeated for tumor #1, 2, 3 Estrogen receptor: Tumor #1 and 2 - previous study demonstrated 0% positivity ([id number] and [id number]) Progesterone receptor: Tumor #1 and 2 - previous study demonstrated 0% positivity ([id number] and [id number]) Lymph nodes: Examined: 1 Lymph nodes with metastasis: 0 TNM: pTis, pN0 Comments: The tissue surrounding the hourglass and T-shaped clips as well as the nodular tissue between the clips was entirely submitted for review. On review, there are numerous foci of high grade DCIS spanning the entire 2.6 and 1.5 cm biopsy cavities, respectively. Sections of tissue between the two clips (spanning 10cm) demonstrate high grade ductal carcinoma in situ with comedo necrosis and calcifications in essentially every tissue section.  Preservation of the myoepithelial layer was confirmed with calponin, smooth muscle myosin heavy chain, and p63 immunostains. Given that the in situ carcinoma within the nodular tissue is grossly and microscopically contiguous with the in situ carcinoma surrounding the hourglass and T-shaped clips, the entire span of in situ carcinoma includes all three areas (e.g. at least 12cm). (CRR:caf 04/26/12) [place]i RUND DO Pathologist, Electronic Signature (Case signed 04/29/2012) 3 of  ASSESSMENT/PLAN:    68 year old female with  #1 Bilateral breast cancers,  DCIS high grade. Right breast with multifocal DCIS left with a single focus of DCIS. Left breast DCIS was ER positive PR negative right breast DCIS ER negative PR negative. Patient is now status post bilateral mastectomies. She has been seen by radiation oncology and she will not receive radiation because of mastectomies. She has also been seen by plastic surgery and patient did not pursue bilateral reconstructions. There is  no clinical evidence of any disease recurrence at this time. I have discussed in detail with the patient to follow the healthy diet including more fruits and vegetables and cutting down on red meat intake. I have encouraged her to enroll in exercise program. I have reviewed  CMP results  performed in primary care office. CBC results will be obtained from the primary care office.  Next followup visit in one year with CBC differential and CMP   All questions were answered. The patient knows to call the clinic with any problems, questions or concerns. We can certainly see the patient much sooner if necessary.   I spent 20 minutes counseling the patient face to face. The total time spent in the appointment was 30 minutes.   Wilmon Arms, MD Medical/Oncology [facility] [phone] (Office)  09/12/2013, 10:41 AM

## 2013-09-12 NOTE — Telephone Encounter (Signed)
, °

## 2013-10-23 ENCOUNTER — Other Ambulatory Visit (INDEPENDENT_AMBULATORY_CARE_PROVIDER_SITE_OTHER): Payer: Self-pay

## 2013-10-23 MED ORDER — UNABLE TO FIND: Status: DC

## 2014-02-02 ENCOUNTER — Encounter: Payer: Self-pay | Admitting: *Deleted

## 2014-04-29 ENCOUNTER — Telehealth: Payer: Self-pay | Admitting: Hematology and Oncology

## 2014-04-29 NOTE — Telephone Encounter (Signed)
due to pal moved 6/13 appt to 6/27. left message for pt and mailed schedule.

## 2014-06-04 ENCOUNTER — Encounter: Payer: Self-pay | Admitting: Podiatry

## 2014-06-04 ENCOUNTER — Ambulatory Visit (INDEPENDENT_AMBULATORY_CARE_PROVIDER_SITE_OTHER): Payer: Medicare Other | Admitting: Podiatry

## 2014-06-04 VITALS — BP 148/72 | HR 55 | Resp 12

## 2014-06-04 DIAGNOSIS — M2041 Other hammer toe(s) (acquired), right foot: Secondary | ICD-10-CM

## 2014-06-04 DIAGNOSIS — L84 Corns and callosities: Secondary | ICD-10-CM

## 2014-06-04 DIAGNOSIS — M779 Enthesopathy, unspecified: Secondary | ICD-10-CM

## 2014-06-04 NOTE — Progress Notes (Signed)
Subjective:     Patient ID: Krystal Watson, female   DOB: 1945/08/22, 69 y.o.   MRN: [id number]  HPI patient presents stating I have a painful corn on my right fifth toe is making shoe gear difficult and it's making me walk differently and I'm getting pain on top of my foot   Review of Systems     Objective:   Physical Exam Neurovascular status intact with muscle strength adequate range of motion within normal limits and keratotic lesion fifth digit right that's painful when pressed and moderate discomfort on the dorsal tendon complex right around anterior tibial as it comes across the ankle joint    Assessment:     Hammertoe deformity right with keratotic lesion formation and tendinitis dorsal right    Plan:     H&P performed and condition discussed. At this time I went ahead and I debrided the lesion fully and applied padding to take stress off of it and I explained hammertoe surgery which may be necessary for this particular condition patient be seen back when symptomatically and we will decide what may be necessary

## 2014-09-08 ENCOUNTER — Encounter (HOSPITAL_COMMUNITY): Payer: Self-pay | Admitting: Emergency Medicine

## 2014-09-08 ENCOUNTER — Emergency Department (HOSPITAL_COMMUNITY)
Admission: EM | Admit: 2014-09-08 | Discharge: 2014-09-08 | Disposition: A | Discharge status: Home / Self Care | Payer: Medicare Other | Attending: Emergency Medicine | Admitting: Emergency Medicine

## 2014-09-08 DIAGNOSIS — Z79899 Other long term (current) drug therapy: Secondary | ICD-10-CM | POA: Insufficient documentation

## 2014-09-08 DIAGNOSIS — M1990 Unspecified osteoarthritis, unspecified site: Secondary | ICD-10-CM | POA: Diagnosis not present

## 2014-09-08 DIAGNOSIS — R1110 Vomiting, unspecified: Secondary | ICD-10-CM | POA: Insufficient documentation

## 2014-09-08 DIAGNOSIS — E876 Hypokalemia: Secondary | ICD-10-CM | POA: Diagnosis not present

## 2014-09-08 DIAGNOSIS — K219 Gastro-esophageal reflux disease without esophagitis: Secondary | ICD-10-CM | POA: Diagnosis not present

## 2014-09-08 DIAGNOSIS — R1013 Epigastric pain: Secondary | ICD-10-CM | POA: Diagnosis not present

## 2014-09-08 DIAGNOSIS — G8929 Other chronic pain: Secondary | ICD-10-CM | POA: Diagnosis not present

## 2014-09-08 DIAGNOSIS — E785 Hyperlipidemia, unspecified: Secondary | ICD-10-CM | POA: Insufficient documentation

## 2014-09-08 DIAGNOSIS — Z7982 Long term (current) use of aspirin: Secondary | ICD-10-CM | POA: Diagnosis not present

## 2014-09-08 DIAGNOSIS — Z853 Personal history of malignant neoplasm of breast: Secondary | ICD-10-CM | POA: Insufficient documentation

## 2014-09-08 DIAGNOSIS — Z8719 Personal history of other diseases of the digestive system: Secondary | ICD-10-CM | POA: Diagnosis not present

## 2014-09-08 DIAGNOSIS — E119 Type 2 diabetes mellitus without complications: Secondary | ICD-10-CM | POA: Insufficient documentation

## 2014-09-08 DIAGNOSIS — Z9049 Acquired absence of other specified parts of digestive tract: Secondary | ICD-10-CM | POA: Diagnosis not present

## 2014-09-08 DIAGNOSIS — I10 Essential (primary) hypertension: Secondary | ICD-10-CM | POA: Insufficient documentation

## 2014-09-08 DIAGNOSIS — R109 Unspecified abdominal pain: Secondary | ICD-10-CM | POA: Diagnosis present

## 2014-09-08 LAB — CBC WITH DIFFERENTIAL/PLATELET
BASOS ABS: 0 10*3/uL (ref 0.0–0.1)
Basophils Relative: 0 % (ref 0–1)
Eosinophils Absolute: 0 10*3/uL (ref 0.0–0.7)
Eosinophils Relative: 0 % (ref 0–5)
HEMATOCRIT: 46.3 % — AB (ref 36.0–46.0)
HEMOGLOBIN: 14.9 g/dL (ref 12.0–15.0)
LYMPHS ABS: 1.6 10*3/uL (ref 0.7–4.0)
LYMPHS PCT: 9 % — AB (ref 12–46)
MCH: 31.6 pg (ref 26.0–34.0)
MCHC: 32.2 g/dL (ref 30.0–36.0)
MCV: 98.1 fL (ref 78.0–100.0)
Monocytes Absolute: 0.2 10*3/uL (ref 0.1–1.0)
Monocytes Relative: 1 % — ABNORMAL LOW (ref 3–12)
Neutro Abs: 15 10*3/uL — ABNORMAL HIGH (ref 1.7–7.7)
Neutrophils Relative %: 90 % — ABNORMAL HIGH (ref 43–77)
PLATELETS: 221 10*3/uL (ref 150–400)
RBC: 4.72 MIL/uL (ref 3.87–5.11)
RDW: 13.6 % (ref 11.5–15.5)
WBC: 16.8 10*3/uL — AB (ref 4.0–10.5)

## 2014-09-08 LAB — BASIC METABOLIC PANEL
Anion gap: 14 (ref 5–15)
BUN: 14 mg/dL (ref 6–20)
CO2: 28 mmol/L (ref 22–32)
CREATININE: 0.64 mg/dL (ref 0.44–1.00)
Calcium: 9.4 mg/dL (ref 8.9–10.3)
Chloride: 100 mmol/L — ABNORMAL LOW (ref 101–111)
GFR calc non Af Amer: >60 mL/min (ref >60)
GLUCOSE: 171 mg/dL — AB (ref 65–99)
Potassium: 3 mmol/L — ABNORMAL LOW (ref 3.5–5.1)
Sodium: 142 mmol/L (ref 135–145)

## 2014-09-08 LAB — URINALYSIS, ROUTINE W REFLEX MICROSCOPIC
Bilirubin Urine: NEGATIVE
Glucose, UA: 250 mg/dL — AB
Hgb urine dipstick: NEGATIVE
KETONES UR: NEGATIVE mg/dL
Leukocytes, UA: NEGATIVE
Nitrite: NEGATIVE
PROTEIN: NEGATIVE mg/dL
Specific Gravity, Urine: 1.023 (ref 1.005–1.030)
UROBILINOGEN UA: 1 mg/dL (ref 0.0–1.0)
pH: 5 (ref 5.0–8.0)

## 2014-09-08 LAB — LIPASE, BLOOD: Lipase: 18 U/L — ABNORMAL LOW (ref 22–51)

## 2014-09-08 MED ORDER — DEXLANSOPRAZOLE 30 MG PO CPDR: 30.0000 mg | DELAYED_RELEASE_CAPSULE | Freq: Every day | ORAL | Status: DC

## 2014-09-08 MED ORDER — DICYCLOMINE HCL 20 MG PO TABS: 20.0000 mg | ORAL_TABLET | Freq: Two times a day (BID) | ORAL | Status: DC

## 2014-09-08 MED ORDER — ONDANSETRON HCL 4 MG/2ML IJ SOLN
4.0000 mg | Freq: Once | INTRAMUSCULAR | Status: AC
  Administered 2014-09-08: 4 mg via INTRAVENOUS
  Filled 2014-09-08: qty 2

## 2014-09-08 MED ORDER — SUCRALFATE 1 G PO TABS
1.0000 g | ORAL_TABLET | Freq: Once | ORAL | Status: AC
  Administered 2014-09-08: 1 g via ORAL
  Filled 2014-09-08: qty 1

## 2014-09-08 MED ORDER — POTASSIUM CHLORIDE CRYS ER 20 MEQ PO TBCR
40.0000 meq | EXTENDED_RELEASE_TABLET | Freq: Once | ORAL | Status: AC
  Administered 2014-09-08: 40 meq via ORAL
  Filled 2014-09-08: qty 2

## 2014-09-08 NOTE — ED Provider Notes (Signed)
CSN: [id number]     Arrival date & time 09/08/14  0153 History   First MD Initiated Contact with Patient 09/08/14 0403     Chief Complaint  Patient presents with  . Abdominal Pain     (Consider location/radiation/quality/duration/timing/severity/associated sxs/prior Treatment) HPI Comments: This some morbidly obese female with a history of gastric reflux disease, status post cholecystectomy who presents with acute onset of epigastric pain while standing proximally 30 minutes after eating.  She took Gaviscon which she normally takes for exacerbations of her GERD without any relief.  She had one small emesis immediately after that. She was brought to the emergency department for further evaluation.  She denies shortness of breath, chest pain, constipation or diarrhea, fever.  She reports that her blood sugars have been in the 120s  Patient is a 69 y.o. female presenting with abdominal pain. The history is provided by the patient.  Abdominal Pain Pain location:  Epigastric Pain quality: pressure and stabbing   Pain radiates to:  Does not radiate Pain severity:  Moderate Onset quality:  Sudden Timing:  Constant Progression:  Improving Chronicity:  Recurrent Ineffective treatments:  Antacids Associated symptoms: vomiting   Associated symptoms: no chest pain, no cough, no dysuria, no fever and no nausea   Risk factors: obesity     Past Medical History  Diagnosis Date  . Diabetes mellitus   . Diverticulosis   . Gallstones   . Hyperlipidemia   . Hypertension   . Obesity   . GERD (gastroesophageal reflux disease)   . Cancer     breast  . Shortness of breath     with exertion  . Arthritis   . Allergy     sulfa  . Chronic pain    Past Surgical History  Procedure Laterality Date  . Cholecystectomy    . Abdominal hysterectomy    . Colonoscopy    . Simple mastectomy with axillary sentinel node biopsy  04/24/2012    Procedure: SIMPLE MASTECTOMY WITH AXILLARY SENTINEL NODE BIOPSY;   Surgeon: Haywood Lasso, MD;  Location: [facility];  Service: General;  Laterality: Bilateral;  Bilateral total mastectomy and bilateral sentinel node  . Ercp      Hx 2x   Family History  Problem Relation Age of Onset  . Breast cancer Sister   . Colon cancer Father 62  . Diabetes Mother   . Heart disease Mother   . Esophageal cancer Neg Hx   . Stomach cancer Neg Hx   . Rectal cancer Neg Hx    History  Substance Use Topics  . Smoking status: Never Smoker   . Smokeless tobacco: Never Used  . Alcohol Use: Yes     Comment: rare   OB History    Gravida Para Term Preterm AB TAB SAB Ectopic Multiple Living   2 2              Obstetric Comments   meses age 15, first preg age 71     Review of Systems  Constitutional: Negative for fever.  Respiratory: Negative for cough.   Cardiovascular: Negative for chest pain and leg swelling.  Gastrointestinal: Positive for vomiting and abdominal pain. Negative for nausea.  Genitourinary: Negative for dysuria.  Skin: Negative for rash.  All other systems reviewed and are negative.     Allergies  Sulfonamide derivatives  Home Medications   Prior to Admission medications   Medication Sig Start Date End Date Taking? Authorizing Provider  Alum Hydroxide-Mag Carbonate (GAVISCON  PO) Take 2 tablets by mouth daily as needed. For stomach   Yes Historical Provider, MD  aspirin 81 MG tablet Take 81 mg by mouth daily.   Yes Historical Provider, MD  atorvastatin (LIPITOR) 40 MG tablet Take 40 mg by mouth daily.   Yes Historical Provider, MD  Calcium Carbonate-Vitamin D (CALTRATE 600+D) 600-400 MG-UNIT per tablet Take 2 tablets by mouth 2 (two) times daily.    Yes Historical Provider, MD  CINNAMON PO Take 2 capsules by mouth daily.   Yes Historical Provider, MD  diltiazem (DILACOR XR) 240 MG 24 hr capsule Take 240 mg by mouth daily.   Yes Historical Provider, MD  ferrous sulfate 325 (65 FE) MG tablet Take 325 mg by mouth daily with breakfast.   Yes  Historical Provider, MD  hydrochlorothiazide (HYDRODIURIL) 25 MG tablet Take 25 mg by mouth daily.   Yes Historical Provider, MD  ibandronate (BONIVA) 150 MG tablet Take 150 mg by mouth every 30 (thirty) days. Take in the morning with a full glass of water, on an empty stomach, and do not take anything else by mouth or lie down for the next 30 min.   Yes Historical Provider, MD  metFORMIN (GLUCOPHAGE) 500 MG tablet Take 500 mg by mouth 2 (two) times daily.    Yes Historical Provider, MD  Multiple Vitamins-Minerals (ONE-A-DAY WEIGHT SMART ADVANCE) TABS Take 1 tablet by mouth daily.   Yes Historical Provider, MD  phenylephrine (SUDAFED PE) 10 MG TABS Take 10 mg by mouth every 4 (four) hours as needed. For allergies   Yes Historical Provider, MD  pioglitazone (ACTOS) 30 MG tablet Take 30 mg by mouth daily.   Yes Historical Provider, MD  Probiotic Product (ALIGN) 4 MG CAPS Take 1 capsule by mouth daily. 05/31/11  Yes Irene Shipper, MD  ramipril (ALTACE) 10 MG tablet Take 10 mg by mouth daily.   Yes Historical Provider, MD  Dexlansoprazole (DEXILANT) 30 MG capsule Take 1 capsule (30 mg total) by mouth daily. 09/08/14   [name], PA-C  dicyclomine (BENTYL) 20 MG tablet Take 1 tablet (20 mg total) by mouth 2 (two) times daily. 09/08/14   [name], PA-C  UNABLE TO FIND Rx: L8000-Post Surgical Bras (Quantity: 6) I7124- Silicone Breast Prosthesis (Quantity: 2) Dx: 174.9; bilateral mastectomies 10/23/13   Erroll Luna, MD   BP 124/61 mmHg  Pulse 110  Temp(Src) 97.5 F (36.4 C) (Oral)  Resp 18  SpO2 99% Physical Exam  Constitutional: She appears well-developed and well-nourished.  HENT:  Head: Normocephalic.  Eyes: Pupils are equal, round, and reactive to light.  Neck: Normal range of motion.  Cardiovascular: Normal rate and regular rhythm.   Pulmonary/Chest: Effort normal and breath sounds normal.  Abdominal: Soft. Bowel sounds are normal. She exhibits no distension. There is tenderness in  the epigastric area.  Musculoskeletal: Normal range of motion.  Neurological: She is alert.  Skin: Skin is warm and dry.  Nursing note and vitals reviewed.   ED Course  Procedures (including critical care time) Labs Review Labs Reviewed  CBC WITH DIFFERENTIAL/PLATELET - Abnormal; Notable for the following:    WBC 16.8 (*)    HCT 46.3 (*)    Neutrophils Relative % 90 (*)    Neutro Abs 15.0 (*)    Lymphocytes Relative 9 (*)    Monocytes Relative 1 (*)    All other components within normal limits  URINALYSIS, ROUTINE W REFLEX MICROSCOPIC (NOT AT [facility]) - Abnormal; Notable for the following:  Glucose, UA 250 (*)    All other components within normal limits  BASIC METABOLIC PANEL - Abnormal; Notable for the following:    Potassium 3.0 (*)    Chloride 100 (*)    Glucose, Bld 171 (*)    All other components within normal limits  LIPASE, BLOOD - Abnormal; Notable for the following:    Lipase 18 (*)    All other components within normal limits    Imaging Review No results found.   EKG Interpretation   Date/Time:  Tuesday September 08 2014 02:09:25 EDT Ventricular Rate:  63 PR Interval:  170 QRS Duration: 96 QT Interval:  487 QTC Calculation: 499 R Axis:   -27 Text Interpretation:  Sinus rhythm Left ventricular hypertrophy Borderline  T abnormalities, anterior leads No significant change was found Confirmed  by Florina Ou  MD, Jenny Reichmann ([id number]) on 09/08/2014 4:07:08 AM      MDM   Final diagnoses:  Epigastric abdominal pain  Hypokalemia         Junius Creamer, NP 09/10/14 [place], MD 09/11/14 2234

## 2014-09-08 NOTE — ED Notes (Signed)
Krystal Watson currently in room getting labs

## 2014-09-08 NOTE — ED Notes (Signed)
Per Previous shift pt has CMP lab ordered which was collected twice and hemolyzed each time, per lab. Night shift reports calling hospital phlebotomy numerous times since, and they still didn't come. This Probation officer called twice, no answer.

## 2014-09-08 NOTE — ED Notes (Signed)
Pt's son came in to visit pt, shortly afterward he comes out appearing irritated, asking why has his mother been here "for seven hours without you doing anything except giving her pill that only made her feel worse". Explained to son we had to recollect some blood work and that provider will talk to them once they have those results ready. He voiced understanding, however remained unsatisfied.

## 2014-09-08 NOTE — ED Provider Notes (Signed)
Patient taking for Np Krystal Watson Patient here with chronic epigastric pain. Awaiting CMP.     Patient's abdominal pain resolved. Her labs are reassuring. She states that she has no more pain. Will discharge with DEXilant and Bentyl. Follow up with primary care physician. She appears safe for discharge at this time and tolerating by mouth fluids  [name], PA-C 09/08/14 1624

## 2014-09-08 NOTE — ED Notes (Signed)
Pt presents from home via EMS for epigastric pain x3 hours, sharp in nature. Hx of cholecystectomy.  VS: 110/70, 60Hr, 99% RA, 18Resp  324mg  Aspirin in route

## 2014-09-08 NOTE — Discharge Instructions (Signed)
Abdominal Pain Many things can cause abdominal pain. Usually, abdominal pain is not caused by a disease and will improve without treatment. It can often be observed and treated at home. Your health care provider will do a physical exam and possibly order blood tests and X-rays to help determine the seriousness of your pain. However, in many cases, more time must pass before a clear cause of the pain can be found. Before that point, your health care provider may not know if you need more testing or further treatment. HOME CARE INSTRUCTIONS  Monitor your abdominal pain for any changes. The following actions may help to alleviate any discomfort you are experiencing:  Only take over-the-counter or prescription medicines as directed by your health care provider.  Do not take laxatives unless directed to do so by your health care provider.  Try a clear liquid diet (broth, tea, or water) as directed by your health care provider. Slowly move to a bland diet as tolerated. SEEK MEDICAL CARE IF:  You have unexplained abdominal pain.  You have abdominal pain associated with nausea or diarrhea.  You have pain when you urinate or have a bowel movement.  You experience abdominal pain that wakes you in the night.  You have abdominal pain that is worsened or improved by eating food.  You have abdominal pain that is worsened with eating fatty foods.  You have a fever. SEEK IMMEDIATE MEDICAL CARE IF:   Your pain does not go away within 2 hours.  You keep throwing up (vomiting).  Your pain is felt only in portions of the abdomen, such as the right side or the left lower portion of the abdomen.  You pass bloody or black tarry stools. MAKE SURE YOU:  Understand these instructions.   Will watch your condition.   Will get help right away if you are not doing well or get worse.  Document Released: 12/28/2004 Document Revised: 03/25/2013 Document Reviewed: 11/27/2012 [facility] Patient Information  2015 [place], [place]. This information is not intended to replace advice given to you by your health care provider. Make sure you discuss any questions you have with your health care provider.  Hypokalemia Hypokalemia means that the amount of potassium in the blood is lower than normal.Potassium is a chemical, called an electrolyte, that helps regulate the amount of fluid in the body. It also stimulates muscle contraction and helps nerves function properly.Most of the body's potassium is inside of cells, and only a very small amount is in the blood. Because the amount in the blood is so small, minor changes can be life-threatening. CAUSES  Antibiotics.  Diarrhea or vomiting.  Using laxatives too much, which can cause diarrhea.  Chronic kidney disease.  Water pills (diuretics).  Eating disorders (bulimia).  Low magnesium level.  Sweating a lot. SIGNS AND SYMPTOMS  Weakness.  Constipation.  Fatigue.  Muscle cramps.  Mental confusion.  Skipped heartbeats or irregular heartbeat (palpitations).  Tingling or numbness. DIAGNOSIS  Your health care provider can diagnose hypokalemia with blood tests. In addition to checking your potassium level, your health care provider may also check other lab tests. TREATMENT Hypokalemia can be treated with potassium supplements taken by mouth or adjustments in your current medicines. If your potassium level is very low, you may need to get potassium through a vein (IV) and be monitored in the hospital. A diet high in potassium is also helpful. Foods high in potassium are:  Nuts, such as peanuts and pistachios.  Seeds, such as sunflower  seeds and pumpkin seeds.  Peas, lentils, and lima beans.  Whole grain and bran cereals and breads.  Fresh fruit and vegetables, such as apricots, avocado, bananas, cantaloupe, kiwi, oranges, tomatoes, asparagus, and potatoes.  Orange and tomato juices.  Red meats.  Fruit yogurt. HOME CARE  INSTRUCTIONS  Take all medicines as prescribed by your health care provider.  Maintain a healthy diet by including nutritious food, such as fruits, vegetables, nuts, whole grains, and lean meats.  If you are taking a laxative, be sure to follow the directions on the label. SEEK MEDICAL CARE IF:  Your weakness gets worse.  You feel your heart pounding or racing.  You are vomiting or having diarrhea.  You are diabetic and having trouble keeping your blood glucose in the normal range. SEEK IMMEDIATE MEDICAL CARE IF:  You have chest pain, shortness of breath, or dizziness.  You are vomiting or having diarrhea for more than 2 days.  You faint. MAKE SURE YOU:   Understand these instructions.  Will watch your condition.  Will get help right away if you are not doing well or get worse. Document Released: 03/20/2005 Document Revised: 01/08/2013 Document Reviewed: 09/20/2012 [facility] Patient Information 2015 [place], [place]. This information is not intended to replace advice given to you by your health care provider. Make sure you discuss any questions you have with your health care provider.

## 2014-09-08 NOTE — ED Notes (Signed)
Pt had an episode of emesis, small in size and light brown color.

## 2014-09-14 ENCOUNTER — Ambulatory Visit: Payer: 59 | Admitting: Hematology and Oncology

## 2014-09-23 ENCOUNTER — Other Ambulatory Visit (INDEPENDENT_AMBULATORY_CARE_PROVIDER_SITE_OTHER): Payer: Medicare Other

## 2014-09-23 ENCOUNTER — Ambulatory Visit (INDEPENDENT_AMBULATORY_CARE_PROVIDER_SITE_OTHER): Payer: Medicare Other | Admitting: Internal Medicine

## 2014-09-23 ENCOUNTER — Encounter: Payer: Self-pay | Admitting: Internal Medicine

## 2014-09-23 VITALS — BP 128/76 | HR 68 | Ht 62.0 in | Wt 267.0 lb

## 2014-09-23 DIAGNOSIS — R140 Abdominal distension (gaseous): Secondary | ICD-10-CM | POA: Diagnosis not present

## 2014-09-23 DIAGNOSIS — G8929 Other chronic pain: Secondary | ICD-10-CM

## 2014-09-23 DIAGNOSIS — E876 Hypokalemia: Secondary | ICD-10-CM | POA: Diagnosis not present

## 2014-09-23 DIAGNOSIS — K219 Gastro-esophageal reflux disease without esophagitis: Secondary | ICD-10-CM

## 2014-09-23 DIAGNOSIS — R1010 Upper abdominal pain, unspecified: Secondary | ICD-10-CM | POA: Diagnosis not present

## 2014-09-23 DIAGNOSIS — R1084 Generalized abdominal pain: Secondary | ICD-10-CM

## 2014-09-23 DIAGNOSIS — R1011 Right upper quadrant pain: Secondary | ICD-10-CM

## 2014-09-23 LAB — BASIC METABOLIC PANEL WITH GFR
BUN: 11 mg/dL (ref 6–23)
CO2: 33 meq/L — ABNORMAL HIGH (ref 19–32)
Calcium: 10.1 mg/dL (ref 8.4–10.5)
Chloride: 97 meq/L (ref 96–112)
Creatinine, Ser: 0.68 mg/dL (ref 0.40–1.20)
GFR: 111.05 mL/min (ref >60.00)
Glucose, Bld: 190 mg/dL — ABNORMAL HIGH (ref 70–99)
Potassium: 3.5 meq/L (ref 3.5–5.1)
Sodium: 140 meq/L (ref 135–145)

## 2014-09-23 NOTE — Patient Instructions (Signed)
Your physician has requested that you go to the basement for the following lab work before leaving today:  BMET

## 2014-09-23 NOTE — Progress Notes (Signed)
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 She presents today with a chief complaint of right-sided abdominal pain and a recent emergency room visit. The patient was initially seen in this office September 2010 and most recently February 2013. From that evaluation "She self-referred regarding problems with increased intestinal gas. She has a history of chronic right-sided pain. She is status post hysterectomy and cholecystectomy. She has a history of choledocholithiasis for which she underwent ERCP with common duct stone removal by Dr. Clarene Watson (2001 and again in 2004). She was last seen in September 2010 regarding right-sided abdominal pain that she's had this for years (see that dictation for details). She has multiple volumes of outside records which I have reviewed previously. These include multiple office visits with Dr. Watt Watson as well as multiple procedures and laboratories.. In summary, she has had complaints of right-sided abdominal pain of at least 10 years duration. In addition to 2 ERCPs as described, she has had incomplete colonoscopy with coupled negative barium enema in 2001 and negative virtual colonoscopy in 2007.[name] Other GI complaints include intermittent heartburn and indigestion with regurgitation. Currently on no medications. Also, chronic intermittent constipation which continues. Today, she reports having had severe sharp epigastric discomfort after consuming a hot dog with chilly, onions, and coleslaw. She subsequently took Gaviscon and then road her exercise bike, all in the attempts of inducing belching. At one point, she belched multiple times with complete relief of abdominal discomfort. Her other complaint is that of bloating. No nausea, vomiting, or dysphagia. She continues to be significantly overweight. Her constipation is without change, but  she denies bleeding. She does have family history of colon cancer in her father ( advanced age) with previous evaluations as noted above. She is due for, and interested in, followup." She is self-referred today and accompanied by her husband. She did undergo colonoscopy 06/13/2011. This was normal except for mild diverticulosis of the sigmoid colon. Most recent hospital emergency room Evaluation [address] June seventh 2016. I have reviewed that encounter. Her PPI was changed from Nexium to [place]. Laboratories were remarkable for potassium of 3.0 (which she was aware). White blood cell count slightly elevated at 16.8. Normal hemoglobin 14.9. With time, her pain resolved and she was discharged home. Complaints today include chronic bloating and upper abdominal discomfort which is relieved with belching. This is unchanged from previous. She continues with intermittent right-sided pain which is positional. She denies active reflux symptoms on PPI. Her diabetes been stable. Other GI medications include dicyclomine, align. No additional exacerbating or relieving factors. She continues to remain terribly overweight but has committed to weight loss program this summer. Her husband participates in the encounter and has multiple questions.  REVIEW OF SYSTEMS:  All non-GI ROS negative except for sinus and allergy, arthritis, shortness of breath with exertion  Past Medical History  Diagnosis Date  . Diabetes mellitus   . Diverticulosis   . Gallstones   . Hyperlipidemia   . Hypertension   . Obesity   . GERD (gastroesophageal reflux disease)   . Cancer     breast  . Shortness of breath     with exertion  . Arthritis   . Allergy     sulfa  . Chronic pain     Past Surgical History  Procedure Laterality Date  . Cholecystectomy    . Abdominal hysterectomy    .  Colonoscopy    . Simple mastectomy with axillary sentinel node biopsy  04/24/2012    Procedure: SIMPLE MASTECTOMY WITH AXILLARY SENTINEL NODE  BIOPSY;  Surgeon: Krystal Lasso, MD;  Location: [place];  Service: General;  Laterality: Bilateral;  Bilateral total mastectomy and bilateral sentinel node  . Ercp      Hx 2x    Social History Krystal Watson  reports that she has never smoked. She has never used smokeless tobacco. She reports that she does not drink alcohol or use illicit drugs.  family history includes Breast cancer in her sister; Colon cancer (age of onset: 60) in her father; Diabetes in her mother; Heart disease in her mother. There is no history of Esophageal cancer, Stomach cancer, or Rectal cancer.  Allergies  Allergen Reactions  . Sulfonamide Derivatives     Pt can not remember the reaction       PHYSICAL EXAMINATION: Vital signs: BP 128/76 mmHg  Pulse 68  Ht 5\' 2"  (1.575 m)  Wt 267 lb (121.11 kg)  BMI 48.82 kg/m2  Constitutional: Morbidly obese but otherwise generally well-appearing, no acute distress Psychiatric: alert and oriented x3, cooperative Eyes: extraocular movements intact, anicteric, conjunctiva pink Mouth: oral pharynx moist, no lesions Neck: supple no lymphadenopathy Cardiovascular: heart regular rate and rhythm, no murmur Lungs: clear to auscultation bilaterally Abdomen: soft, tender over the right lower rib cage, nondistended, no obvious ascites, no peritoneal signs, normal bowel sounds, no organomegaly Rectal: Omitted Extremities: no lower extremity edema bilaterally Skin: no lesions on visible extremities Neuro: No focal deficits. No asterixis.   ASSESSMENT:  #1. Chronic right-sided abdominal pain. Most consistent with musculoskeletal #2. Chronic increased intestinal gas with bloating, gas bloat. #3. Morbid obesity #4. History of constipation. Uses MiraLAX. Unremarkable colonoscopy 2013 save mild diverticulosis #5. GERD. Controlled with PPI #6. Hypokalemia. On diuretic's   PLAN:  #1. Reassurance with regards to chronic pain #2. Anti-gas and flatulence dietary  measures #3. Exercise and weight loss. This patient needs to lose at least 100 pounds #4. MiraLAX as needed #5. Antireflux measures. Weight loss. #6. Continue PPI to control GERD #7. Screening colonoscopy 2023 if medically fit #8. Repeat potassium level today. We will contact the patient with the result. If abnormal she needs to follow up with her PCP regarding treatment and monitoring. Advised #9. Return to the care of your primary care physician. Interval GI follow-up as needed  25 minutes was spent face-to-face with the patient and her husband. Greater than 50% of the time was used to counsel regarding her multiple diagnoses and treatment recommendations   ADDENDUM: Potassium returned normal at 3.5. Patient notified. To follow-up with her PCP

## 2014-09-27 NOTE — Assessment & Plan Note (Addendum)
Left Mastectomy 04/24/14: DCIS with comedonecrosis and calcifications. ER 32% PR negative;  Right mastectomy grade 3 multifocal DCIS 2.6, 1.5, and 10.0 cm. 1SLN neg, ER/PR Neg  She has also been seen by plastic surgery and patient did not pursue bilateral reconstructions. There is no clinical evidence of any disease recurrence at this time.  Breast Cancer Surveillance: 1. chest exam : Normal 2. Mammogram: No role since she had mastectomies  ? Anti-estrogen therapy RTC 1 year

## 2014-09-28 ENCOUNTER — Ambulatory Visit: Payer: No Typology Code available for payment source | Admitting: Hematology and Oncology

## 2014-09-30 ENCOUNTER — Telehealth: Payer: Self-pay | Admitting: Hematology and Oncology

## 2014-09-30 ENCOUNTER — Other Ambulatory Visit: Payer: Self-pay

## 2014-09-30 NOTE — Telephone Encounter (Signed)
Spoke with patient as she thought her appointment was 7/27,done  anne

## 2014-10-27 NOTE — Assessment & Plan Note (Signed)
Bilateral breast cancers, Right Mastectomy: DCIS high grade. multifocal DCIS. grade 3 multifocal DCIS measuring 2.6, 1.5, and 10.0 cm. One sentinel node was negative  Left breast DCIS was ER positive PR negative right breast DCIS ER negative PR negative. Patient is now status post bilateral mastectomies  Breast Cancer Surveillance: 1. Chest exam 10/27/14: Normal 2. Mammogram: No role of mammograms since she had bil mastectomies

## 2014-10-28 ENCOUNTER — Encounter: Payer: Self-pay | Admitting: Hematology and Oncology

## 2014-10-28 ENCOUNTER — Ambulatory Visit (HOSPITAL_BASED_OUTPATIENT_CLINIC_OR_DEPARTMENT_OTHER): Payer: Medicare Other | Admitting: Hematology and Oncology

## 2014-10-28 ENCOUNTER — Telehealth: Payer: Self-pay | Admitting: Hematology and Oncology

## 2014-10-28 VITALS — BP 140/60 | HR 83 | Temp 98.1°F | Resp 20 | Ht 62.0 in | Wt 264.6 lb

## 2014-10-28 DIAGNOSIS — Z86000 Personal history of in-situ neoplasm of breast: Secondary | ICD-10-CM

## 2014-10-28 DIAGNOSIS — C50911 Malignant neoplasm of unspecified site of right female breast: Secondary | ICD-10-CM

## 2014-10-28 NOTE — Telephone Encounter (Signed)
Appointments made and avs printed for patient °

## 2014-10-28 NOTE — Progress Notes (Signed)
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 Tumor was ER negative PR negative   04/24/2012 Surgery Left Mastectomy: DCIS with comedonecrosis and calcifications. ER 32% PR negative; right mastectomy grade 3 multifocal DCIS 2.6, 1.5, and 10.0 cm. 1SLN neg, ER/PR Neg    CHIEF COMPLIANT: Annual follow-up of breast cancer  INTERVAL HISTORY: Krystal Watson is a 69 year old with above-mentioned history of bilateral mastectomies for bilateral DCIS who is here for annual follow-up evaluation. She reports no new problems or concerns with the chest wall. She has been enjoying her time taking care of her granddaughter who is 25 months old. She has not been on any exercise regimen. She is obese. She plans to go back to swimming and exercising soon.  REVIEW OF SYSTEMS:   Constitutional: Denies fevers, chills or abnormal weight loss Eyes: Denies blurriness of vision Ears, nose, mouth, throat, and face: Denies mucositis or sore throat Respiratory: Denies cough, dyspnea or wheezes Cardiovascular: Denies palpitation, chest discomfort or lower extremity swelling Gastrointestinal:  Denies nausea, heartburn or change in bowel habits Skin: Denies abnormal skin rashes Lymphatics: Denies new lymphadenopathy or easy bruising Neurological:Denies numbness, tingling or new weaknesses Behavioral/Psych: Mood is stable, no new changes  Breast:   denies any pain or lumps or nodules in either breasts All other systems were reviewed with the patient and are negative.  I have reviewed the past medical history, past surgical history, social history and family history with the patient and they are unchanged from previous note.  ALLERGIES:  is allergic to sulfonamide derivatives.  MEDICATIONS:  Current Outpatient Prescriptions  Medication Sig Dispense Refill  . Alum Hydroxide-Mag Carbonate (GAVISCON PO) Take 2 tablets by mouth daily as needed. For stomach    . aspirin 81 MG tablet Take 81 mg by mouth daily.    [name] atorvastatin (LIPITOR) 40 MG tablet Take 40 mg by mouth daily.    . Calcium Carbonate-Vitamin D (CALTRATE 600+D) 600-400 MG-UNIT per tablet Take 2 tablets by mouth 2 (two) times daily.     [name] Dexlansoprazole (DEXILANT) 30 MG capsule Take 1 capsule (30 mg total) by mouth daily. 30 capsule 0  . dicyclomine (BENTYL) 20 MG tablet Take 1 tablet (20 mg total) by mouth 2 (two) times daily. 20 tablet 0  . ferrous sulfate 325 (65 FE) MG tablet Take 325 mg by mouth daily with breakfast.    . hydrochlorothiazide (HYDRODIURIL) 25 MG tablet Take 25 mg by mouth daily.    . metFORMIN (GLUCOPHAGE) 500 MG tablet Take 500 mg by mouth 2 (two) times daily.     . Multiple Vitamins-Minerals (ONE-A-DAY WEIGHT SMART ADVANCE) TABS Take 1 tablet by mouth daily.    . phenylephrine (SUDAFED PE) 10 MG TABS Take 10 mg by mouth every 4 (four) hours as needed. For allergies    . pioglitazone (ACTOS) 30 MG tablet Take 30 mg by mouth daily.    [name] PROAIR HFA 108 (90 BASE) MCG/ACT inhaler  inhale 2 puffs by mouth every 6 hours for 10 days  0  . Probiotic Product (ALIGN) 4 MG CAPS Take 1 capsule by mouth daily. 14 capsule 0  . ramipril (ALTACE) 10 MG tablet Take 10 mg by mouth daily.    [name] UNABLE TO FIND Rx: L8000-Post Surgical Bras (Quantity: 6) [id number]- Silicone Breast Prosthesis (Quantity: 2) Dx: 174.9; bilateral mastectomies 1 each 0  . CINNAMON PO Take 2 capsules by  mouth daily.    [name] diltiazem (DILACOR XR) 240 MG 24 hr capsule Take 240 mg by mouth daily.    [name] ibandronate (BONIVA) 150 MG tablet Take 150 mg by mouth every 30 (thirty) days. Take in the morning with a full glass of water, on an empty stomach, and do not take anything else by mouth or lie down for the next 30 min.    . [DISCONTINUED] esomeprazole (NEXIUM) 40 MG capsule Take 40 mg by mouth daily before breakfast.     No current facility-administered medications for this visit.    PHYSICAL EXAMINATION: ECOG PERFORMANCE STATUS: 0 - Asymptomatic  Filed Vitals:   10/28/14 1016  BP: 140/60  Pulse: 83  Temp: 98.1 F (36.7 C)  Resp: 20   Filed Weights   10/28/14 1016  Weight: 264 lb 9.6 oz (120.022 kg)    GENERAL:alert, no distress and comfortable SKIN: skin color, texture, turgor are normal, no rashes or significant lesions EYES: normal, Conjunctiva are pink and non-injected, sclera clear OROPHARYNX:no exudate, no erythema and lips, buccal mucosa, and tongue normal  NECK: supple, thyroid normal size, non-tender, without nodularity LYMPH:  no palpable lymphadenopathy in the cervical, axillary or inguinal LUNGS: clear to auscultation and percussion with normal breathing effort HEART: regular rate & rhythm and no murmurs and no lower extremity edema ABDOMEN:abdomen soft, non-tender and normal bowel sounds Musculoskeletal:no cyanosis of digits and no clubbing  NEURO: alert & oriented x 3 with fluent speech, no focal motor/sensory deficits BREAST: No palpable lumps or nodules in either chest wall. No lymphadenopathy in the axilla. (exam performed in the presence of a chaperone)  LABORATORY DATA:  I have reviewed the data as listed   Chemistry      Component Value Date/Time   NA 140 09/23/2014 1119   NA 142 08/19/2012 1336   K 3.5 09/23/2014 1119   K 4.0 08/19/2012 1336   CL 97 09/23/2014 1119   CL 103 08/19/2012 1336   CO2 33* 09/23/2014 1119   CO2 29 08/19/2012 1336   BUN 11  09/23/2014 1119   BUN 11.2 08/19/2012 1336   CREATININE 0.68 09/23/2014 1119   CREATININE 0.8 08/19/2012 1336      Component Value Date/Time   CALCIUM 10.1 09/23/2014 1119   CALCIUM 9.6 08/19/2012 1336   ALKPHOS 114 08/19/2012 1336   ALKPHOS 108 04/18/2012 1447   AST 18 08/19/2012 1336   AST 32 04/18/2012 1447   ALT 12 08/19/2012 1336   ALT 19 04/18/2012 1447   BILITOT 0.58 08/19/2012 1336   BILITOT 0.4 04/18/2012 1447       Lab Results  Component Value Date   WBC 16.8* 09/08/2014   HGB 14.9 09/08/2014   HCT 46.3* 09/08/2014   MCV 98.1 09/08/2014   PLT 221 09/08/2014   NEUTROABS 15.0* 09/08/2014   ASSESSMENT & PLAN:  Breast cancer, right breast, UOQ, DCIS Bilateral breast cancers, Right Mastectomy: DCIS high grade. multifocal DCIS. grade 3 multifocal DCIS measuring 2.6, 1.5, and 10.0 cm. One sentinel node was negative  Left breast DCIS was ER positive PR negative right breast DCIS ER negative PR negative. Patient is now status post bilateral mastectomies  Breast Cancer Surveillance: 1. Chest exam 10/27/14: Normal 2. Mammogram: No role of mammograms since she had bil mastectomies    No orders of the defined types were placed in this encounter.   The patient has a good understanding of the overall plan. she agrees with it. she will call with any problems that may develop before the next visit here.   Rulon Eisenmenger, MD

## 2014-11-11 ENCOUNTER — Ambulatory Visit
Admission: RE | Admit: 2014-11-11 | Discharge: 2014-11-11 | Disposition: A | Discharge status: Home / Self Care | Payer: Medicare Other | Source: Ambulatory Visit | Attending: Internal Medicine | Admitting: Internal Medicine

## 2014-11-11 ENCOUNTER — Other Ambulatory Visit: Payer: Self-pay | Admitting: Internal Medicine

## 2014-11-11 DIAGNOSIS — R1084 Generalized abdominal pain: Secondary | ICD-10-CM

## 2015-10-28 ENCOUNTER — Ambulatory Visit (HOSPITAL_BASED_OUTPATIENT_CLINIC_OR_DEPARTMENT_OTHER): Payer: Medicare Other | Admitting: Hematology and Oncology

## 2015-10-28 ENCOUNTER — Telehealth: Payer: Self-pay | Admitting: Hematology and Oncology

## 2015-10-28 ENCOUNTER — Encounter: Payer: Self-pay | Admitting: Hematology and Oncology

## 2015-10-28 DIAGNOSIS — C50411 Malignant neoplasm of upper-outer quadrant of right female breast: Secondary | ICD-10-CM

## 2015-10-28 DIAGNOSIS — Z86000 Personal history of in-situ neoplasm of breast: Secondary | ICD-10-CM | POA: Diagnosis not present

## 2015-10-28 NOTE — Assessment & Plan Note (Signed)
Breast cancer, right breast, UOQ, DCIS Bilateral breast cancers, Right Mastectomy  04/24/2012: DCIS high grade. multifocal DCIS. grade 3 multifocal DCIS measuring 2.6, 1.5, and 10.0 cm. One sentinel node was negative  Left breast DCIS was ER positive PR negative right breast DCIS ER negative PR negative. Patient is now status post bilateral mastectomies  Breast Cancer Surveillance: 1. Chest exam 10/28/15: Normal 2. Mammogram: No role of mammograms since she had bil mastectomies.   Return to clinic in 1 year for follow-up

## 2015-10-28 NOTE — Telephone Encounter (Signed)
appt made and avs printed °

## 2015-10-28 NOTE — Progress Notes (Signed)
Patient Care Team: Seward Carol, MD as PCP - General (Internal Medicine) Neldon Mc, MD as Consulting Physician (General Surgery) Consuela Mimes, MD (Inactive) as Consulting Physician (Oncology) Delila Pereyra, MD as Consulting Physician (Gynecology)  DIAGNOSIS: Breast cancer, right breast, UOQ, DCIS   Staging form: Breast, AJCC 7th Edition   - Clinical stage from 03/15/2012: Stage 0 (Tis, N0, cM0) - Unsigned   - Pathologic: No stage assigned - Unsigned  SUMMARY OF ONCOLOGIC HISTORY:   Breast cancer, right breast, UOQ, DCIS   03/12/2012 Initial Diagnosis    ductal carcinoma in situ with comedonecrosis and calcifications. Tumor was ER negative PR negative     04/24/2012 Surgery    Left Mastectomy: DCIS with comedonecrosis and calcifications. ER 32% PR negative; right mastectomy grade 3 multifocal DCIS 2.6, 1.5, and 10.0 cm. 1SLN neg, ER/PR Neg      CHIEF COMPLIANT: Follow-up of bilateral breast cancers  INTERVAL HISTORY: Krystal Watson is a 70 year old lady with above-mentioned history bilateral mastectomies currently on surveillance. She reports no problems or pain or discomfort in the breast. She denies any new lumps or nodules.  REVIEW OF SYSTEMS:   Constitutional: Denies fevers, chills or abnormal weight loss Eyes: Denies blurriness of vision Ears, nose, mouth, throat, and face: Denies mucositis or sore throat Respiratory: Denies cough, dyspnea or wheezes Cardiovascular: Denies palpitation, chest discomfort Gastrointestinal:  Denies nausea, heartburn or change in bowel habits Skin: Denies abnormal skin rashes Lymphatics: Denies new lymphadenopathy or easy bruising Neurological:Denies numbness, tingling or new weaknesses Behavioral/Psych: Mood is stable, no new changes  Extremities: No lower extremity edema Breast:  denies any pain or lumps or nodules in either breasts All other systems were reviewed with the patient and are negative.  I have reviewed the past medical  history, past surgical history, social history and family history with the patient and they are unchanged from previous note.  ALLERGIES:  is allergic to sulfonamide derivatives.  MEDICATIONS:  Current Outpatient Prescriptions  Medication Sig Dispense Refill  . Alum Hydroxide-Mag Carbonate (GAVISCON PO) Take 2 tablets by mouth daily as needed. For stomach    . aspirin 81 MG tablet Take 81 mg by mouth daily.    [name] atorvastatin (LIPITOR) 40 MG tablet Take 40 mg by mouth daily.    . Calcium Carbonate-Vitamin D (CALTRATE 600+D) 600-400 MG-UNIT per tablet Take 2 tablets by mouth 2 (two) times daily.     [name] CINNAMON PO Take 2 capsules by mouth daily.    [name] Dexlansoprazole (DEXILANT) 30 MG capsule Take 1 capsule (30 mg total) by mouth daily. 30 capsule 0  . dicyclomine (BENTYL) 20 MG tablet Take 1 tablet (20 mg total) by mouth 2 (two) times daily. 20 tablet 0  . diltiazem (DILACOR XR) 240 MG 24 hr capsule Take 240 mg by mouth daily.    . ferrous sulfate 325 (65 FE) MG tablet Take 325 mg by mouth daily with breakfast.    . hydrochlorothiazide (HYDRODIURIL) 25 MG tablet Take 25 mg by mouth daily.    [name] ibandronate (BONIVA) 150 MG tablet Take 150 mg by mouth every 30 (thirty) days. Take in the morning with a full glass of water, on an empty stomach, and do not take anything else by mouth or lie down for the next 30 min.    . metFORMIN (GLUCOPHAGE) 500 MG tablet Take 500 mg by mouth 2 (two) times daily.     . Multiple Vitamins-Minerals (ONE-A-DAY WEIGHT SMART ADVANCE) TABS Take 1 tablet by  mouth daily.    . phenylephrine (SUDAFED PE) 10 MG TABS Take 10 mg by mouth every 4 (four) hours as needed. For allergies    . pioglitazone (ACTOS) 30 MG tablet Take 30 mg by mouth daily.    [name] PROAIR HFA 108 (90 BASE) MCG/ACT inhaler inhale 2 puffs by mouth every 6 hours for 10 days  0  . Probiotic Product (ALIGN) 4 MG CAPS Take 1 capsule by mouth daily. 14 capsule 0  . ramipril (ALTACE) 10 MG tablet Take 10 mg by mouth  daily.    [name] UNABLE TO FIND Rx: L8000-Post Surgical Bras (Quantity: 6) [id number]- Silicone Breast Prosthesis (Quantity: 2) Dx: 174.9; bilateral mastectomies 1 each 0   No current facility-administered medications for this visit.     PHYSICAL EXAMINATION: ECOG PERFORMANCE STATUS: 1 - Symptomatic but completely ambulatory  Vitals:   10/28/15 1213  BP: 138/75  Pulse: 60  Resp: 18  Temp: 98.6 F (37 C)   Filed Weights   10/28/15 1213  Weight: 274 lb 4.8 oz (124.4 kg)    GENERAL:alert, no distress and comfortable SKIN: skin color, texture, turgor are normal, no rashes or significant lesions EYES: normal, Conjunctiva are pink and non-injected, sclera clear OROPHARYNX:no exudate, no erythema and lips, buccal mucosa, and tongue normal  NECK: supple, thyroid normal size, non-tender, without nodularity LYMPH:  no palpable lymphadenopathy in the cervical, axillary or inguinal LUNGS: clear to auscultation and percussion with normal breathing effort HEART: regular rate & rhythm and no murmurs and no lower extremity edema ABDOMEN:abdomen soft, non-tender and normal bowel sounds MUSCULOSKELETAL:no cyanosis of digits and no clubbing  NEURO: alert & oriented x 3 with fluent speech, no focal motor/sensory deficits EXTREMITIES: No lower extremity edema BREAST: Bilateral mastectomies. No palpable lumps or nodules of concern. (exam performed in the presence of a chaperone)  LABORATORY DATA:  I have reviewed the data as listed   Chemistry      Component Value Date/Time   NA 140 09/23/2014 1119   NA 142 08/19/2012 1336   K 3.5 09/23/2014 1119   K 4.0 08/19/2012 1336   CL 97 09/23/2014 1119   CL 103 08/19/2012 1336   CO2 33 (H) 09/23/2014 1119   CO2 29 08/19/2012 1336   BUN 11 09/23/2014 1119   BUN 11.2 08/19/2012 1336   CREATININE 0.68 09/23/2014 1119   CREATININE 0.8 08/19/2012 1336      Component Value Date/Time   CALCIUM 10.1 09/23/2014 1119   CALCIUM 9.6 08/19/2012 1336   ALKPHOS  114 08/19/2012 1336   AST 18 08/19/2012 1336   ALT 12 08/19/2012 1336   BILITOT 0.58 08/19/2012 1336       Lab Results  Component Value Date   WBC 16.8 (H) 09/08/2014   HGB 14.9 09/08/2014   HCT 46.3 (H) 09/08/2014   MCV 98.1 09/08/2014   PLT 221 09/08/2014   NEUTROABS 15.0 (H) 09/08/2014     ASSESSMENT & PLAN:  Breast cancer, right breast, UOQ, DCIS Bilateral breast cancers, Right Mastectomy  04/24/2012: DCIS high grade. multifocal DCIS. grade 3 multifocal DCIS measuring 2.6, 1.5, and 10.0 cm. One sentinel node was negative  Left breast DCIS was ER positive PR negative right breast DCIS ER negative PR negative. Patient is now status post bilateral mastectomies  Breast Cancer Surveillance: 1. Chest exam 10/28/15: Normal 2. Mammogram: No role of mammograms since she had bil mastectomies.  Weight: Patient is obese and needs to lose weight. She is quite aware of  it. She wants to lose weight. I referred her to the weight loss program that we have in our cancer center.  Return to clinic in 1 year for follow-up.    No orders of the defined types were placed in this encounter.  The patient has a good understanding of the overall plan. she agrees with it. she will call with any problems that may develop before the next visit here.   Rulon Eisenmenger, MD 10/28/15

## 2016-06-09 IMAGING — US US ABDOMEN COMPLETE
1 series · 13 of 25 positions shown · non-contrast
Comparison: CT abdomen and pelvis of 08/07/2008

CLINICAL DATA: Abdominal pain for 2 years, some nausea

EXAM:
ULTRASOUND ABDOMEN COMPLETE

[Series 1: us abdomen complete · 0.21mm/px · 13 of 71 slices shown]
[im 1/71]
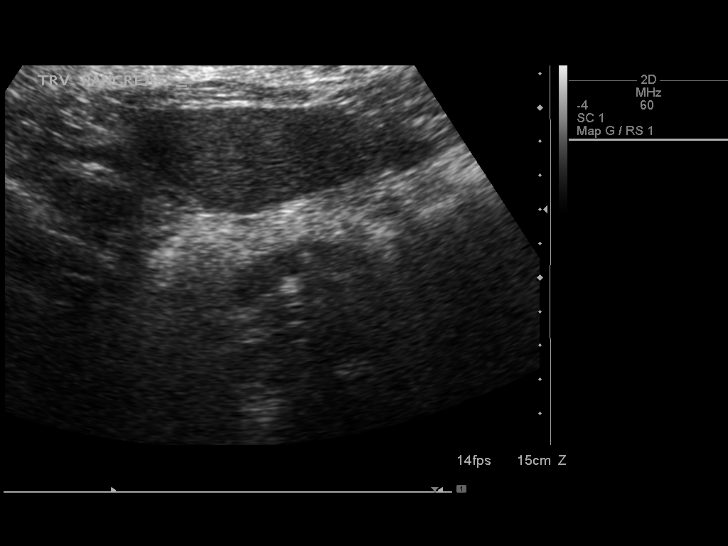
[im 6/71]
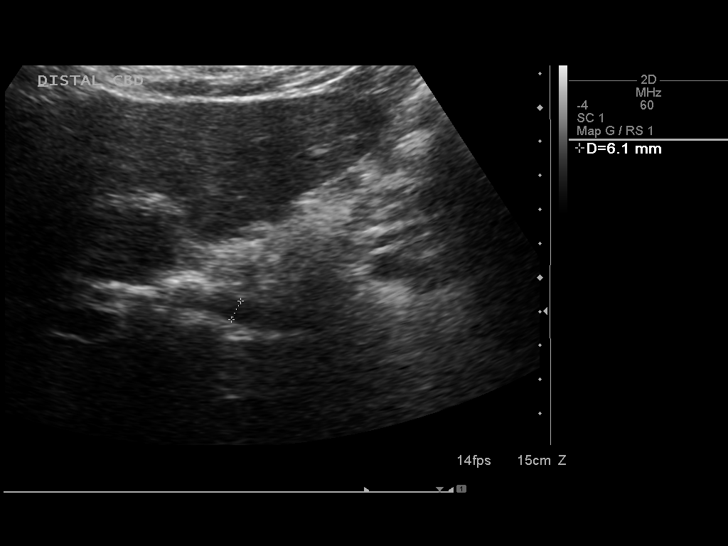
[im 12/71]
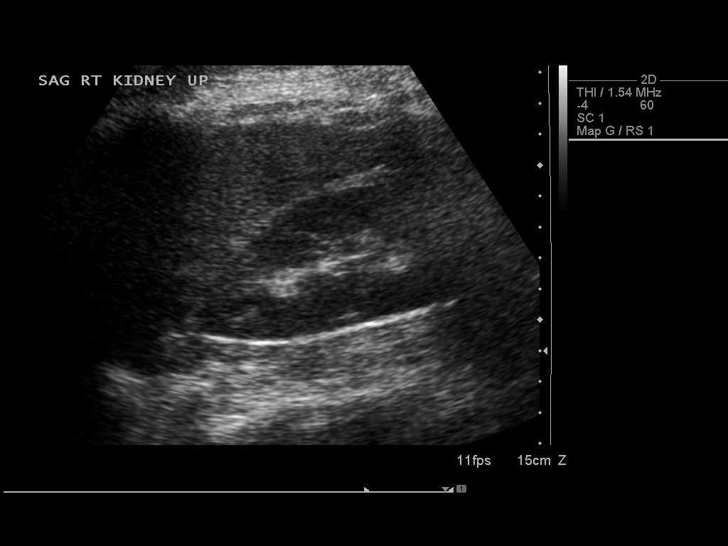
[im 18/71]
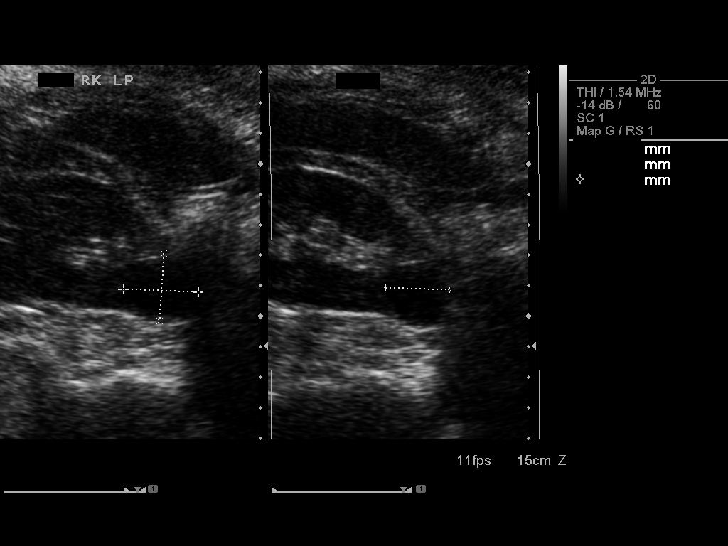
[im 24/71]
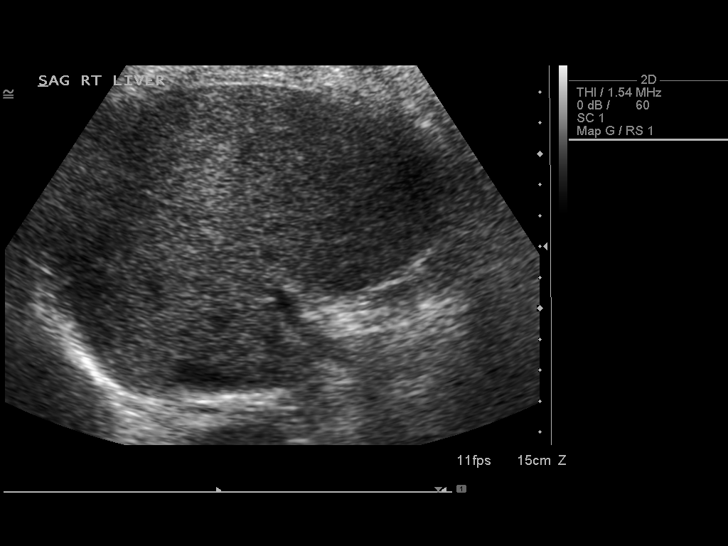
[im 30/71]
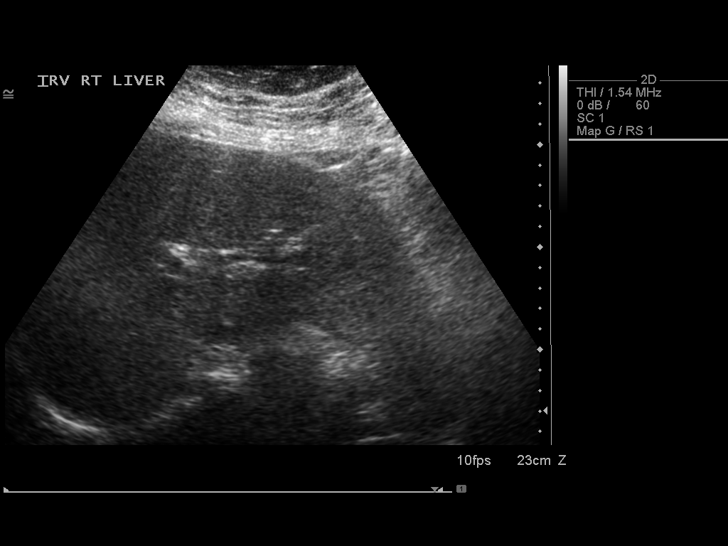
[im 36/71]
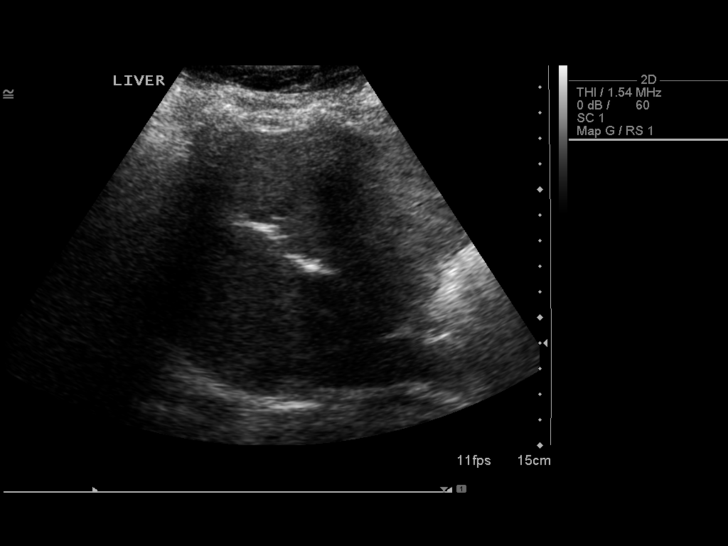
[im 41/71]
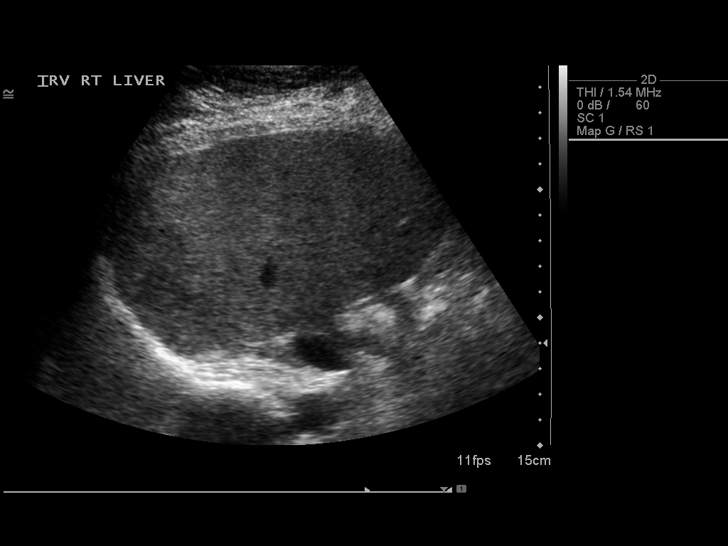
[im 47/71]
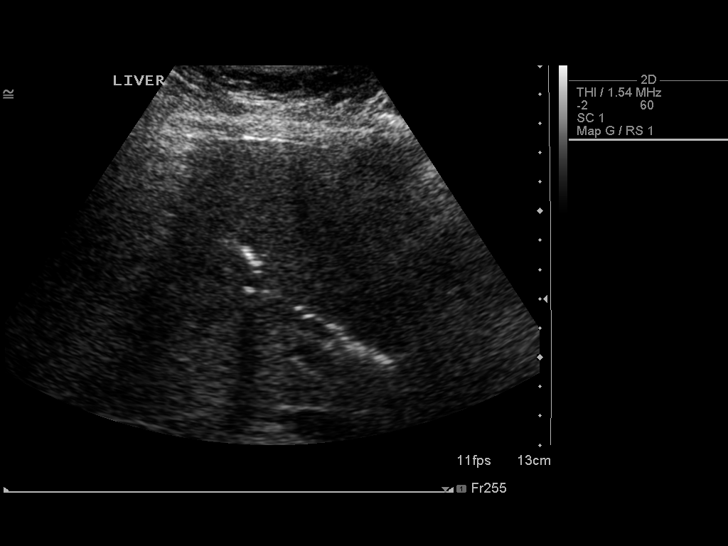
[im 53/71]
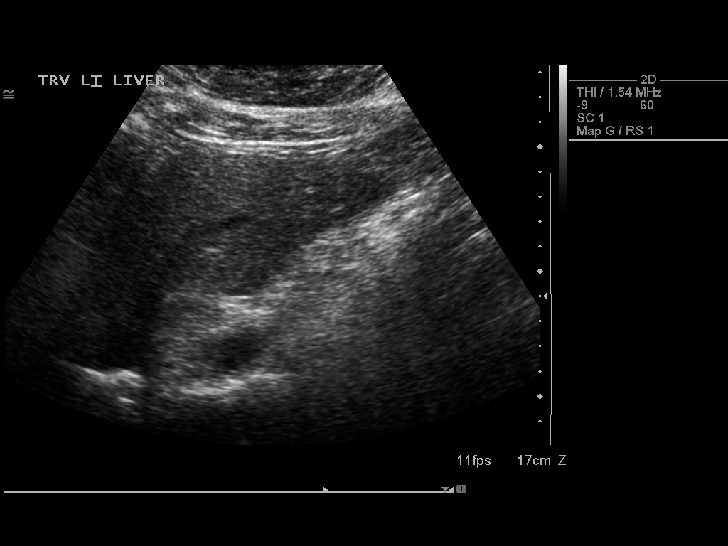
[im 59/71]
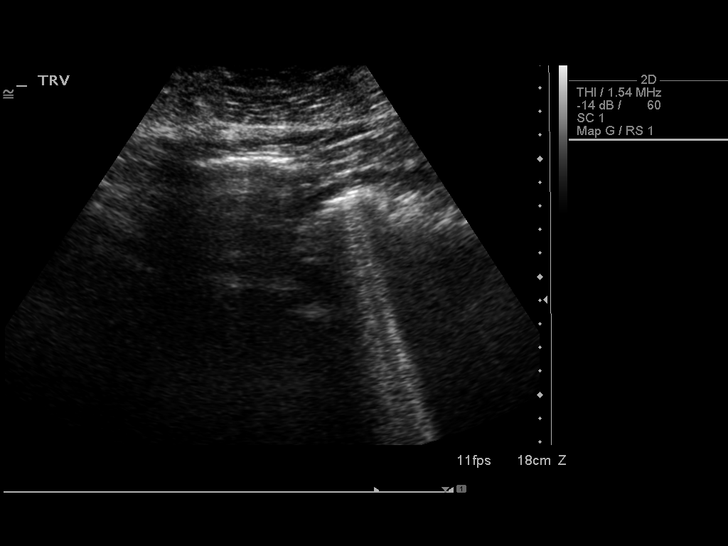
[im 65/71]
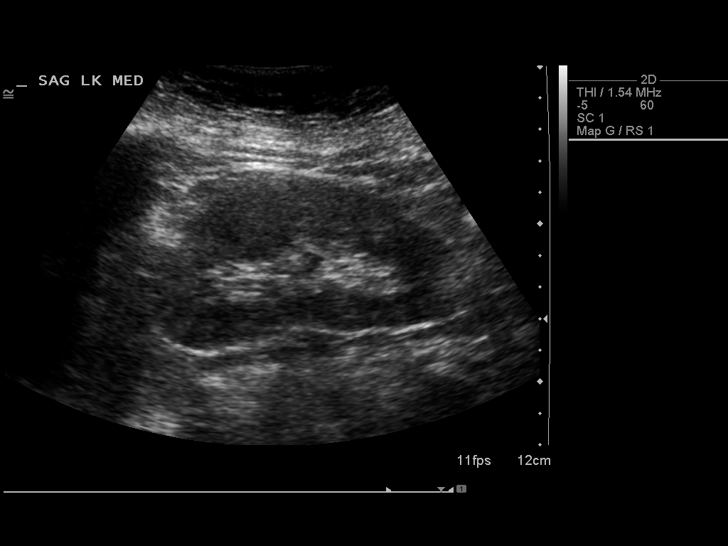
[im 71/71]
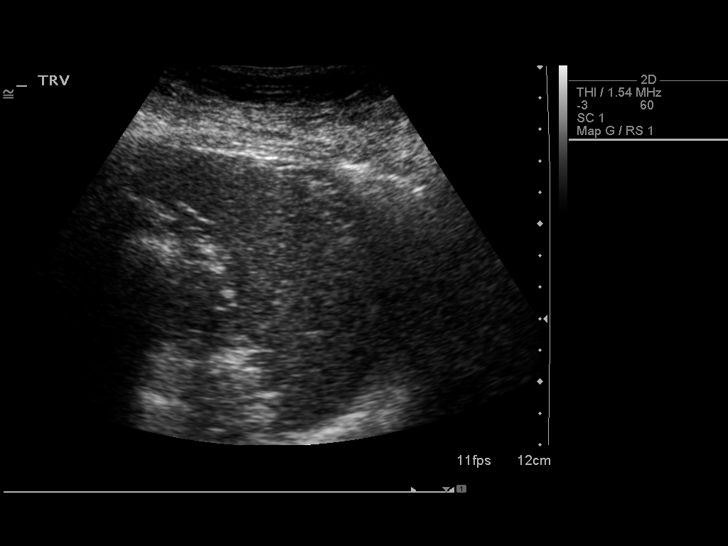

[13 of 25 positions shown; findings below may reference images not displayed]

FINDINGS: Gallbladder:  The gallbladder has previously been resected.

Common bile duct: Diameter: The common bile duct is normal measuring
3.8 mm proximally, and 6.1 mm distally post cholecystectomy.

Liver: The liver is inhomogeneous consistent with fatty
infiltration. Echogenic foci [REDACTED] indicate some air within the
biliary tree.

IVC: No abnormality visualized.

Pancreas: The pancreas is largely obscured by bowel gas.

Spleen: The spleen is normal measuring 6.0 cm.

Right Kidney: Length: 10.2 cm.. No hydronephrosis is seen. A cyst is
noted in the lower pole of 2.5 cm.

Left Kidney: Length: 10.0 cm..  No hydronephrosis is noted.

Abdominal aorta: The abdominal aorta is normal in caliber.

Other findings: This study is somewhat compromised by large patient
body habitus.
IMPRESSION: 1. Inhomogeneous echogenicity of the liver may indicate fatty
infiltration. No focal hepatic abnormality is seen. Question some
air in the biliary system possibly postoperative.
2. Prior cholecystectomy.
3. No hydronephrosis.
4. The study is somewhat compromised by large patient body habitus.

## 2016-10-27 ENCOUNTER — Ambulatory Visit (HOSPITAL_BASED_OUTPATIENT_CLINIC_OR_DEPARTMENT_OTHER): Payer: Medicare Other | Admitting: Hematology and Oncology

## 2016-10-27 ENCOUNTER — Encounter: Payer: Self-pay | Admitting: Hematology and Oncology

## 2016-10-27 DIAGNOSIS — C50411 Malignant neoplasm of upper-outer quadrant of right female breast: Secondary | ICD-10-CM

## 2016-10-27 DIAGNOSIS — Z86000 Personal history of in-situ neoplasm of breast: Secondary | ICD-10-CM | POA: Diagnosis not present

## 2016-10-27 DIAGNOSIS — Z170 Estrogen receptor positive status [ER+]: Principal | ICD-10-CM

## 2016-10-27 MED ORDER — CALCIUM CARBONATE-VITAMIN D 600-400 MG-UNIT PO TABS: 1.0000 | ORAL_TABLET | Freq: Two times a day (BID) | ORAL | Status: DC

## 2016-10-27 MED ORDER — ESOMEPRAZOLE MAGNESIUM 20 MG PO CPDR
20.0000 mg | DELAYED_RELEASE_CAPSULE | Freq: Every day | ORAL | Status: DC
Start: 2016-10-27 — End: 2020-09-17

## 2016-10-27 NOTE — Progress Notes (Signed)
Patient Care Team: Seward Carol, MD as PCP - General (Internal Medicine) Neldon Mc, MD as Consulting Physician (General Surgery) Marcy Panning, MD as Consulting Physician (Oncology) Delila Pereyra, MD as Consulting Physician (Gynecology)  DIAGNOSIS:  Encounter Diagnosis  Name Primary?  . Malignant neoplasm of upper-outer quadrant of right breast in female, estrogen receptor positive ([place])     SUMMARY OF ONCOLOGIC HISTORY:   Breast cancer, right breast, UOQ, DCIS   03/12/2012 Initial Diagnosis    ductal carcinoma in situ with comedonecrosis and calcifications. Tumor was ER negative PR negative      04/24/2012 Surgery    Left Mastectomy: DCIS with comedonecrosis and calcifications. ER 32% PR negative; right mastectomy grade 3 multifocal DCIS 2.6, 1.5, and 10.0 cm. 1SLN neg, ER/PR Neg       CHIEF COMPLIANT: surveillance of breast cancer status post bilateral mastectomies  INTERVAL HISTORY: Krystal Watson is a 71 year old above-mentioned history of bilateral mastectomies. She had DCIS previously. DCIS was ER/PR negative and hence did not need antiestrogen therapy. She is here for annual surveillance follow-up. She reports to be doing quite well. Denies any lumps or nodules in the chest wall or axilla. She feels tight underneath the arm in the chest wall occasionally.  REVIEW OF SYSTEMS:   Constitutional: Denies fevers, chills or abnormal weight loss Eyes: Denies blurriness of vision Ears, nose, mouth, throat, and face: Denies mucositis or sore throat Respiratory: Denies cough, dyspnea or wheezes Cardiovascular: Denies palpitation, chest discomfort Gastrointestinal:  Denies nausea, heartburn or change in bowel habits Skin: Denies abnormal skin rashes Lymphatics: Denies new lymphadenopathy or easy bruising Neurological:Denies numbness, tingling or new weaknesses Behavioral/Psych: Mood is stable, no new changes  Extremities: No lower extremity edema Breast:  denies any  pain or lumps or nodules in either breasts All other systems were reviewed with the patient and are negative.  I have reviewed the past medical history, past surgical history, social history and family history with the patient and they are unchanged from previous note.  ALLERGIES:  is allergic to sulfonamide derivatives.  MEDICATIONS:  Current Outpatient Prescriptions  Medication Sig Dispense Refill  . Alum Hydroxide-Mag Carbonate (GAVISCON PO) Take 2 tablets by mouth daily as needed. For stomach    . aspirin 81 MG tablet Take 81 mg by mouth daily.    [name] atorvastatin (LIPITOR) 40 MG tablet Take 40 mg by mouth daily.    . Calcium Carbonate-Vitamin D (CALTRATE 600+D) 600-400 MG-UNIT per tablet Take 2 tablets by mouth 2 (two) times daily.     [name] CINNAMON PO Take 2 capsules by mouth daily.    [name] Dexlansoprazole (DEXILANT) 30 MG capsule Take 1 capsule (30 mg total) by mouth daily. 30 capsule 0  . dicyclomine (BENTYL) 20 MG tablet Take 1 tablet (20 mg total) by mouth 2 (two) times daily. 20 tablet 0  . diltiazem (DILACOR XR) 240 MG 24 hr capsule Take 240 mg by mouth daily.    . ferrous sulfate 325 (65 FE) MG tablet Take 325 mg by mouth daily with breakfast.    . hydrochlorothiazide (HYDRODIURIL) 25 MG tablet Take 25 mg by mouth daily.    [name] ibandronate (BONIVA) 150 MG tablet Take 150 mg by mouth every 30 (thirty) days. Take in the morning with a full glass of water, on an empty stomach, and do not take anything else by mouth or lie down for the next 30 min.    . metFORMIN (GLUCOPHAGE) 500 MG tablet Take 500 mg by  mouth 2 (two) times daily.     . Multiple Vitamins-Minerals (ONE-A-DAY WEIGHT SMART ADVANCE) TABS Take 1 tablet by mouth daily.    . phenylephrine (SUDAFED PE) 10 MG TABS Take 10 mg by mouth every 4 (four) hours as needed. For allergies    . pioglitazone (ACTOS) 30 MG tablet Take 30 mg by mouth daily.    [name] PROAIR HFA 108 (90 BASE) MCG/ACT inhaler inhale 2 puffs by mouth every 6 hours for 10  days  0  . Probiotic Product (ALIGN) 4 MG CAPS Take 1 capsule by mouth daily. 14 capsule 0  . ramipril (ALTACE) 10 MG tablet Take 10 mg by mouth daily.    [name] UNABLE TO FIND Rx: L8000-Post Surgical Bras (Quantity: 6) E0814- Silicone Breast Prosthesis (Quantity: 2) Dx: 174.9; bilateral mastectomies 1 each 0   No current facility-administered medications for this visit.     PHYSICAL EXAMINATION: ECOG PERFORMANCE STATUS: 1 - Symptomatic but completely ambulatory  Vitals:   10/27/16 1211  BP: (!) 140/59  Pulse: 80  Resp: 20  Temp: (!) 97.4 F (36.3 C)   Filed Weights   10/27/16 1211  Weight: 266 lb 4.8 oz (120.8 kg)    GENERAL:alert, no distress and comfortable SKIN: skin color, texture, turgor are normal, no rashes or significant lesions EYES: normal, Conjunctiva are pink and non-injected, sclera clear OROPHARYNX:no exudate, no erythema and lips, buccal mucosa, and tongue normal  NECK: supple, thyroid normal size, non-tender, without nodularity LYMPH:  no palpable lymphadenopathy in the cervical, axillary or inguinal LUNGS: clear to auscultation and percussion with normal breathing effort HEART: regular rate & rhythm and no murmurs and no lower extremity edema ABDOMEN:abdomen soft, non-tender and normal bowel sounds MUSCULOSKELETAL:no cyanosis of digits and no clubbing  NEURO: alert & oriented x 3 with fluent speech, no focal motor/sensory deficits EXTREMITIES: No lower extremity edema BREAST:no palpable lumps or nodules in bilateral chest wall or axilla(exam performed in the presence of a chaperone)  LABORATORY DATA:  I have reviewed the data as listed   Chemistry      Component Value Date/Time   NA 140 09/23/2014 1119   NA 142 08/19/2012 1336   K 3.5 09/23/2014 1119   K 4.0 08/19/2012 1336   CL 97 09/23/2014 1119   CL 103 08/19/2012 1336   CO2 33 (H) 09/23/2014 1119   CO2 29 08/19/2012 1336   BUN 11 09/23/2014 1119   BUN 11.2 08/19/2012 1336   CREATININE 0.68  09/23/2014 1119   CREATININE 0.8 08/19/2012 1336      Component Value Date/Time   CALCIUM 10.1 09/23/2014 1119   CALCIUM 9.6 08/19/2012 1336   ALKPHOS 114 08/19/2012 1336   AST 18 08/19/2012 1336   ALT 12 08/19/2012 1336   BILITOT 0.58 08/19/2012 1336       Lab Results  Component Value Date   WBC 16.8 (H) 09/08/2014   HGB 14.9 09/08/2014   HCT 46.3 (H) 09/08/2014   MCV 98.1 09/08/2014   PLT 221 09/08/2014   NEUTROABS 15.0 (H) 09/08/2014    ASSESSMENT & PLAN:  Breast cancer, right breast, UOQ, DCIS Bilateral breast cancers, Right Mastectomy  04/24/2012: DCIS high grade. multifocal DCIS. grade 3 multifocal DCIS measuring 2.6, 1.5, and 10.0 cm. One sentinel node was negative  Left breast DCIS was ER positive PR negative right breast DCIS ER negative PR negative. Patient is now status post bilateral mastectomies  Breast Cancer Surveillance: 1. Chest exam 10/27/16: Normal 2. Mammogram: No role  of mammograms since she had bil mastectomies.  Weight:patient is trying very hard to lose weight. Return to clinic in 1 year with survivorship clinic   I spent 25 minutes talking to the patient of which more than half was spent in counseling and coordination of care.  No orders of the defined types were placed in this encounter.  The patient has a good understanding of the overall plan. she agrees with it. she will call with any problems that may develop before the next visit here.   Rulon Eisenmenger, MD 10/27/16

## 2016-10-27 NOTE — Assessment & Plan Note (Signed)
Bilateral breast cancers, Right Mastectomy  04/24/2012: DCIS high grade. multifocal DCIS. grade 3 multifocal DCIS measuring 2.6, 1.5, and 10.0 cm. One sentinel node was negative  Left breast DCIS was ER positive PR negative right breast DCIS ER negative PR negative. Patient is now status post bilateral mastectomies  Breast Cancer Surveillance: 1. Chest exam 10/27/16: Normal 2. Mammogram: No role of mammograms since she had bil mastectomies.  Weight:  Return to clinic in 1 year for follow-up in survivorship clinic.

## 2016-11-15 ENCOUNTER — Encounter: Payer: Self-pay | Admitting: Internal Medicine

## 2016-11-15 ENCOUNTER — Ambulatory Visit (INDEPENDENT_AMBULATORY_CARE_PROVIDER_SITE_OTHER): Payer: Medicare Other | Admitting: Internal Medicine

## 2016-11-15 VITALS — BP 108/72 | HR 96 | Ht 62.0 in | Wt 260.0 lb

## 2016-11-15 DIAGNOSIS — R1013 Epigastric pain: Secondary | ICD-10-CM

## 2016-11-15 NOTE — Progress Notes (Signed)
HISTORY OF PRESENT ILLNESS:  Krystal Watson is a 71 y.o. female with multiple medical problems as listed below including morbid obesity and diabetes who presents today self-referred after having problems with abdominal pain over the weekend. She is accompanied by her husband. She was last evaluated 09/23/2014 with the encountered as follows:The patient was initially seen in this office September 2010 and most recently February 2013. From that evaluation "She self-referred regarding problems with increased intestinal gas. She has a history of chronic right-sided pain. She is status post hysterectomy and cholecystectomy. She has a history of choledocholithiasis for which she underwent ERCP with common duct stone removal by Dr. Clarene Essex (2001 and again in 2004). She was last seen in September 2010 regarding right-sided abdominal pain that she's had this for years (see that dictation for details). She has multiple volumes of outside records which I have reviewed previously. These include multiple office visits with Dr. Watt Climes as well as multiple procedures and laboratories.. In summary, she has had complaints of right-sided abdominal pain of at least 10 years duration. In addition to 2 ERCPs as described, she has had incomplete colonoscopy with coupled negative barium enema in 2001 and negative virtual colonoscopy in 2007.[name] Other GI complaints include intermittent heartburn and indigestion with regurgitation. Currently on no medications. Also, chronic intermittent constipation which continues. Today, she reports having had severe sharp epigastric discomfort after consuming a hot dog with chilly, onions, and coleslaw. She subsequently took Gaviscon and then road her exercise bike, all in the attempts of inducing belching. At one point, she belched multiple times with complete relief of abdominal discomfort. Her other complaint is that of bloating. No nausea, vomiting, or dysphagia. She continues to be significantly  overweight. Her constipation is without change, but she denies bleeding. She does have family history of colon cancer in her father ( advanced age) with previous evaluations as noted above. She is due for, and interested in, followup." She is self-referred today and accompanied by her husband. She did undergo colonoscopy 06/13/2011. This was normal except for mild diverticulosis of the sigmoid colon. Most recent hospital emergency room Evaluation [address] June seventh 2016. I have reviewed that encounter. Her PPI was changed from Nexium to [place]. Laboratories were remarkable for potassium of 3.0 (which she was aware). White blood cell count slightly elevated at 16.8. Normal hemoglobin 14.9. With time, her pain resolved and she was discharged home. Complaints today include chronic bloating and upper abdominal discomfort which is relieved with belching. This is unchanged from previous. She continues with intermittent right-sided pain which is positional. She denies active reflux symptoms on PPI. Her diabetes been stable. Other GI medications include dicyclomine, align. No additional exacerbating or relieving factors. She continues to remain terribly overweight but has committed to weight loss program this summer. Her husband participates in the encounter and has multiple questions.  She tells me that she has been doing well over the past year without significant abdominal complaints. However 4 evenings ago she consumed ribs and peanuts. She awoke 1 AM with moderately severe epigastric pain radiating into the back. She took Gaviscon without relief. Self-induced vomiting with some relief. Symptoms returned. Again took Gaviscon and self-induced vomiting. Symptoms resolved after a few hours. No further problems with pain but felt washed out the next day. Reported low-grade temperatures of 99.1 and 99.4. Bowel slightly loose. As of yesterday felt fine. She is wishing for evaluation. They're anticipating traveling.  She is seeing her PCP in the near future.  Unfortunately she continues to be significantly overweight and has not had any significant weight reduction in 2 years. She is up-to-date on colonoscopy.    REVIEW OF SYSTEMS:  All non-GI ROS negative except for arthritis, allergies, exertional shortness of breath  Past Medical History:  Diagnosis Date  . Allergy    sulfa  . Arthritis   . Cancer (HCC)    breast  . Chronic pain   . Diabetes mellitus   . Diverticulosis   . Gallstones   . GERD (gastroesophageal reflux disease)   . Hyperlipidemia   . Hypertension   . Obesity   . Shortness of breath    with exertion    Past Surgical History:  Procedure Laterality Date  . ABDOMINAL HYSTERECTOMY    . CHOLECYSTECTOMY    . COLONOSCOPY    . ERCP     Hx 2x  . SIMPLE MASTECTOMY WITH AXILLARY SENTINEL NODE BIOPSY  04/24/2012   Procedure: SIMPLE MASTECTOMY WITH AXILLARY SENTINEL NODE BIOPSY;  Surgeon: Haywood Lasso, MD;  Location: [facility];  Service: General;  Laterality: Bilateral;  Bilateral total mastectomy and bilateral sentinel node    Social History [name]  reports that she has never smoked. She has never used smokeless tobacco. She reports that she does not drink alcohol or use drugs.  family history includes Breast cancer in her sister; Colon cancer (age of onset: 85) in her father; Diabetes in her mother; Heart disease in her mother.  Allergies  Allergen Reactions  . Sulfonamide Derivatives     Pt can not remember the reaction       PHYSICAL EXAMINATION: Vital signs: BP 108/72   Pulse 96   Ht 5\' 2"  (1.575 m)   Wt 260 lb (117.9 kg)   BMI 47.55 kg/m   Constitutional: Pleasant, obese, generally well-appearing, no acute distress Psychiatric: alert and oriented x3, cooperative Eyes: extraocular movements intact, anicteric, conjunctiva pink Mouth: oral pharynx moist, no lesions Neck: supple no lymphadenopathy Cardiovascular: heart regular rate and rhythm, no  murmur Lungs: clear to auscultation bilaterally Abdomen: soft, massively obese, nontender, nondistended, no obvious ascites, no peritoneal signs, normal bowel sounds, no organomegaly Rectal: Ommitted Extremities: no clubbing or cyanosis. Nonpitting lower extremity edema on the right Skin: no lesions on visible extremities Neuro: No focal deficits. Cranial nerves intact  ASSESSMENT:  #1. Transient epigastric pain as described. Possibly dietary indiscretion, virus, or other. Resolved #2. Status post cholecystectomy #3. Status post ERCP with sphincterotomy for choledocholithiasis #4. Colonoscopy 2013 negative for neoplasia #5. Morbid obesity #6. Chronic abdominal complaints including right-sided pain, bloating, gas, and constipation   PLAN:  #1. Discussion differential #2. Expectant management #3. Weight loss #4. If patient were to have similar recurrent symptoms considerations include recurrent choledocholithiasis or partial small bowel structures. Workup accordingly can be pursued if needed, namely laboratories and imaging. #5. Consider repeat in colonoscopy 2023 for screening #6. Ongoing general medical care with PCP. Interval GI follow-up as needed  25 minutes spent face-to-face with the patient greater than 50 % a time use for counseling and coordination of care. As well, answering questions from her and her husband

## 2016-11-15 NOTE — Patient Instructions (Signed)
Please follow up as needed 

## 2017-02-20 ENCOUNTER — Ambulatory Visit: Payer: Medicare Other | Admitting: Endocrinology

## 2017-03-08 ENCOUNTER — Ambulatory Visit (INDEPENDENT_AMBULATORY_CARE_PROVIDER_SITE_OTHER): Payer: Medicare Other | Admitting: Endocrinology

## 2017-03-08 ENCOUNTER — Encounter: Payer: Self-pay | Admitting: Endocrinology

## 2017-03-08 DIAGNOSIS — E1129 Type 2 diabetes mellitus with other diabetic kidney complication: Secondary | ICD-10-CM

## 2017-03-08 DIAGNOSIS — R809 Proteinuria, unspecified: Secondary | ICD-10-CM

## 2017-03-08 MED ORDER — SITAGLIPTIN PHOSPHATE 100 MG PO TABS: 100.0000 mg | ORAL_TABLET | Freq: Every day | ORAL | 3 refills | Status: DC

## 2017-03-08 MED ORDER — PIOGLITAZONE HCL 15 MG PO TABS: 15.0000 mg | ORAL_TABLET | Freq: Every day | ORAL | 3 refills | Status: DC

## 2017-03-08 MED ORDER — METFORMIN HCL ER 500 MG PO TB24: 1000.0000 mg | ORAL_TABLET | Freq: Every day | ORAL | 3 refills | Status: DC

## 2017-03-08 NOTE — Patient Instructions (Addendum)
good diet and exercise significantly improve the control of your diabetes.  please let me know if you wish to be referred to a dietician.  high blood sugar is very risky to your health.  you should see an eye doctor and dentist every year.  It is very important to get all recommended vaccinations.  Controlling your blood pressure and cholesterol drastically reduces the damage diabetes does to your body.  Those who smoke should quit.  Please discuss these with your doctor.  check your blood sugar once a day.  vary the time of day when you check, between before the 3 meals, and at bedtime.  also check if you have symptoms of your blood sugar being too high or too low.  please keep a record of the readings and bring it to your next appointment here (or you can bring the meter itself).  You can write it on any piece of paper.  please call us sooner if your blood sugar goes below 70, or if you have a lot of readings over 200.  At our office, we are fortunate to have two specialists who are happy to help you:   Leonia Reader, RN, CDE, is a diabetes educator and pump trainer.  She is here on Monday mornings, and all day Tuesday and Wednesday.  She is can help you with low blood sugar avoidance and treatment, injecting insulin, sick day management, and others.   Antonieta Iba, RD is our dietician.  She is here all day Thursday and Friday.  She can advise you about a healthy diet.  She can also help you about a variety of special diabetes situations, such as shift work, Actor, gluten-free, diet for kidney patients, traveling with diabetes, and help for those who need to gain weight.  I have sent a prescription to your pharmacy: to change metformin to extended-release, and to add [place] (if you insurance prefers an alternative, we'll change it).   There are other medications we can add if necessary. I have also sent a prescription to your pharmacy reduce the pioglitizone, because of the ankle swelling.   Please come back for a follow-up appointment in 2 months.     Bariatric Surgery You have so much to gain by losing weight.  You may have already tried every diet and exercise plan imaginable.  And, you may have sought advice from your family physician, too.   Sometimes, in spite of such diligent efforts, you may not be able to achieve long-term results by yourself.  In cases of severe obesity, bariatric or weight loss surgery is a proven method of achieving long-term weight control.  Our Services Our bariatric surgery programs offer our patients new hope and long-term weight-loss solution.  Since introducing our services in 2003, we have conducted more than 2,400 successful procedures.  Our program is designated as a Programmer, multimedia by the Metabolic and Bariatric Surgery Accreditation and Quality Improvement Program (MBSAQIP), a IT trainer that sets rigorous patient safety and outcome standards.  Our program is also designated as a Ecologist by [employer].   Our exceptional weight-loss surgery team specializes in diagnosis, treatment, follow-up care, and ongoing support for our patients with severe weight loss challenges.  We currently offer laparoscopic sleeve gastrectomy, gastric bypass, and adjustable gastric band (LAP-BAND).    Attend our [place] Choosing to undergo a bariatric procedure is a big decision, and one that should not be taken lightly.  You now have two  options in how you learn about weight-loss surgery - in person or online.  Our objective is to ensure you have all of the information that you need to evaluate the advantages and obligations of this life changing procedure.  Please note that you are not alone in this process, and our experienced team is ready to assist and answer all of your questions.  There are several ways to register for a seminar (either on-line or in person): 1)  Call [phone] 2) Go on-line to [facility] and register for either type of seminar.  [email or web address]

## 2017-03-08 NOTE — Progress Notes (Signed)
Subjective:    Patient ID: Krystal Watson, female    DOB: [birth year], 71 y.o.   MRN: [id number]  HPI pt is referred by Dr Delfina Redwood, for diabetes.  Pt states DM was dx'ed in 1998; she has mild if any neuropathy of the lower extremities; she has associated nephropathy; she has never been on insulin; pt says her diet and exercise are good; she has never had GDM, pancreatitis, pancreatic surgery, severe hypoglycemia or DKA.  She takes 2 oral meds.   Past Medical History:  Diagnosis Date  . Allergy    sulfa  . Arthritis   . Cancer (HCC)    breast  . Chronic pain   . Diabetes mellitus   . Diverticulosis   . Gallstones   . GERD (gastroesophageal reflux disease)   . Hyperlipidemia   . Hypertension   . Obesity   . Shortness of breath    with exertion    Past Surgical History:  Procedure Laterality Date  . ABDOMINAL HYSTERECTOMY    . CHOLECYSTECTOMY    . COLONOSCOPY    . ERCP     Hx 2x  . SIMPLE MASTECTOMY WITH AXILLARY SENTINEL NODE BIOPSY  04/24/2012   Procedure: SIMPLE MASTECTOMY WITH AXILLARY SENTINEL NODE BIOPSY;  Surgeon: Haywood Lasso, MD;  Location: [place];  Service: General;  Laterality: Bilateral;  Bilateral total mastectomy and bilateral sentinel node    Social History   Socioeconomic History  . Marital status: Married    Spouse name: Not on file  . Number of children: 2  . Years of education: Not on file  . Highest education level: Not on file  Social Needs  . Financial resource strain: Not on file  . Food insecurity - worry: Not on file  . Food insecurity - inability: Not on file  . Transportation needs - medical: Not on file  . Transportation needs - non-medical: Not on file  Occupational History  . Occupation: Pharmacist, hospital  Tobacco Use  . Smoking status: Never Smoker  . Smokeless tobacco: Never Used  Substance and Sexual Activity  . Alcohol use: No    Comment: rare  . Drug use: No  . Sexual activity: Yes  Other Topics Concern  . Not on file  Social  History Narrative  . Not on file    Current Outpatient Medications on File Prior to Visit  Medication Sig Dispense Refill  . Alum Hydroxide-Mag Carbonate (GAVISCON PO) Take 2 tablets by mouth daily as needed. For stomach    . aspirin 81 MG tablet Take 81 mg by mouth daily.    [name] atorvastatin (LIPITOR) 40 MG tablet Take 40 mg by mouth daily.    . Calcium Carbonate-Vitamin D (CALTRATE 600+D) 600-400 MG-UNIT tablet Take 1 tablet by mouth 2 (two) times daily.    [name] CINNAMON PO Take 2 capsules by mouth daily.    [name] diltiazem (DILACOR XR) 240 MG 24 hr capsule Take 240 mg by mouth daily.    [name] esomeprazole (NEXIUM) 20 MG capsule Take 1 capsule (20 mg total) by mouth daily at 12 noon.    . ferrous sulfate 325 (65 FE) MG tablet Take 325 mg by mouth daily with breakfast.    . hydrochlorothiazide (HYDRODIURIL) 25 MG tablet Take 25 mg by mouth daily.    [name] ibandronate (BONIVA) 150 MG tablet Take 150 mg by mouth every 30 (thirty) days. Take in the morning with a full glass of water, on an empty stomach, and do not  take anything else by mouth or lie down for the next 30 min.    . Multiple Vitamins-Minerals (ONE-A-DAY WEIGHT SMART ADVANCE) TABS Take 1 tablet by mouth daily.    . phenylephrine (SUDAFED PE) 10 MG TABS Take 10 mg by mouth every 4 (four) hours as needed. For allergies    . PROAIR HFA 108 (90 BASE) MCG/ACT inhaler inhale 2 puffs by mouth every 6 hours for 10 days  0  . ramipril (ALTACE) 10 MG tablet Take 10 mg by mouth daily.    [name] UNABLE TO FIND Rx: L8000-Post Surgical Bras (Quantity: 6) I7124- Silicone Breast Prosthesis (Quantity: 2) Dx: 174.9; bilateral mastectomies 1 each 0   No current facility-administered medications on file prior to visit.     Allergies  Allergen Reactions  . Sulfonamide Derivatives     Pt can not remember the reaction    Family History  Problem Relation Age of Onset  . Colon cancer Father 88  . Diabetes Mother   . Heart disease Mother   . Breast cancer Sister     . Esophageal cancer Neg Hx   . Stomach cancer Neg Hx   . Rectal cancer Neg Hx     BP (!) 142/90 (BP Location: Right Arm, Patient Position: Sitting, Cuff Size: Normal)   Pulse 76   Wt 240 lb 12.8 oz (109.2 kg)   SpO2 93%   BMI 44.04 kg/m    Review of Systems denies blurry vision, headache, chest pain, sob, urinary frequency, muscle cramps, excessive diaphoresis, memory loss, depression, cold intolerance, and rhinorrhea.  She has easy bruising.  She has lost 20 lbs x 4 months, due to her efforts. She has intermitt nausea.     Objective:   Physical Exam VS: see vs page GEN: no distress.  Morbid obesity HEAD: head: no deformity eyes: no periorbital swelling, no proptosis external nose and ears are normal mouth: no lesion seen NECK: supple, thyroid is not enlarged CHEST WALL: no deformity LUNGS: clear to auscultation CV: reg rate and rhythm, no murmur ABD: abdomen is soft, nontender.  no hepatosplenomegaly.  not distended.  no hernia MUSCULOSKELETAL: muscle bulk and strength are grossly normal.  no obvious joint swelling.  gait is normal and steady EXTEMITIES: no deformity.  no ulcer on the feet.  feet are of normal color and temp.  Trace bilat leg edema PULSES: dorsalis pedis intact bilat.  no carotid bruit NEURO:  cn 2-12 grossly intact.   readily moves all 4's.  sensation is intact to touch on the feet SKIN:  Normal texture and temperature.  No rash or suspicious lesion is visible.   NODES:  None palpable at the neck.   PSYCH: alert, well-oriented.  Does not appear anxious nor depressed.    A1c=7.9%   Lab Results  Component Value Date   CREATININE 0.68 09/23/2014   BUN 11 09/23/2014   NA 140 09/23/2014   K 3.5 09/23/2014   CL 97 09/23/2014   CO2 33 (H) 09/23/2014   I have reviewed outside records, and summarized: Pt was noted to have elevated a1c, and referred here.  Multiple other med probs were address, including dyslipidemia and HTN     Assessment & Plan:  Type  2 DM, with nephropathy: she needs increased rx, if it can be done with a regimen that avoids or minimizes hypoglycemia. Edema: she should reduce pioglitizone. Obesity: she would benefit from surgery.   Patient Instructions  good diet and exercise significantly improve the control  of your diabetes.  please let me know if you wish to be referred to a dietician.  high blood sugar is very risky to your health.  you should see an eye doctor and dentist every year.  It is very important to get all recommended vaccinations.  Controlling your blood pressure and cholesterol drastically reduces the damage diabetes does to your body.  Those who smoke should quit.  Please discuss these with your doctor.  check your blood sugar once a day.  vary the time of day when you check, between before the 3 meals, and at bedtime.  also check if you have symptoms of your blood sugar being too high or too low.  please keep a record of the readings and bring it to your next appointment here (or you can bring the meter itself).  You can write it on any piece of paper.  please call us sooner if your blood sugar goes below 70, or if you have a lot of readings over 200.  At our office, we are fortunate to have two specialists who are happy to help you:   Leonia Reader, RN, CDE, is a diabetes educator and pump trainer.  She is here on Monday mornings, and all day Tuesday and Wednesday.  She is can help you with low blood sugar avoidance and treatment, injecting insulin, sick day management, and others.   Antonieta Iba, RD is our dietician.  She is here all day Thursday and Friday.  She can advise you about a healthy diet.  She can also help you about a variety of special diabetes situations, such as shift work, Actor, gluten-free, diet for kidney patients, traveling with diabetes, and help for those who need to gain weight.  I have sent a prescription to your pharmacy: to change metformin to extended-release, and to add [place]  (if you insurance prefers an alternative, we'll change it).   There are other medications we can add if necessary. I have also sent a prescription to your pharmacy reduce the pioglitizone, because of the ankle swelling.  Please come back for a follow-up appointment in 2 months.     Bariatric Surgery You have so much to gain by losing weight.  You may have already tried every diet and exercise plan imaginable.  And, you may have sought advice from your family physician, too.   Sometimes, in spite of such diligent efforts, you may not be able to achieve long-term results by yourself.  In cases of severe obesity, bariatric or weight loss surgery is a proven method of achieving long-term weight control.  Our Services Our bariatric surgery programs offer our patients new hope and long-term weight-loss solution.  Since introducing our services in 2003, we have conducted more than 2,400 successful procedures.  Our program is designated as a Programmer, multimedia by the Metabolic and Bariatric Surgery Accreditation and Quality Improvement Program (MBSAQIP), a IT trainer that sets rigorous patient safety and outcome standards.  Our program is also designated as a Ecologist by [employer].   Our exceptional weight-loss surgery team specializes in diagnosis, treatment, follow-up care, and ongoing support for our patients with severe weight loss challenges.  We currently offer laparoscopic sleeve gastrectomy, gastric bypass, and adjustable gastric band (LAP-BAND).    Attend our [place] Choosing to undergo a bariatric procedure is a big decision, and one that should not be taken lightly.  You now have two options in how you learn about weight-loss surgery -  in person or online.  Our objective is to ensure you have all of the information that you need to evaluate the advantages and obligations of this life changing procedure.  Please note that you are not alone in  this process, and our experienced team is ready to assist and answer all of your questions.  There are several ways to register for a seminar (either on-line or in person): 1)  Call [phone] 2) Go on-line to [facility] and register for either type of seminar.  [email or web address]

## 2017-03-10 DIAGNOSIS — E119 Type 2 diabetes mellitus without complications: Secondary | ICD-10-CM | POA: Insufficient documentation

## 2017-03-10 DIAGNOSIS — E1169 Type 2 diabetes mellitus with other specified complication: Secondary | ICD-10-CM | POA: Insufficient documentation

## 2017-03-12 ENCOUNTER — Ambulatory Visit: Payer: Medicare Other | Admitting: Podiatry

## 2017-03-13 ENCOUNTER — Ambulatory Visit: Payer: Medicare Other | Admitting: Endocrinology

## 2017-03-14 ENCOUNTER — Telehealth: Payer: Self-pay | Admitting: Endocrinology

## 2017-03-14 NOTE — Telephone Encounter (Signed)
Krystal Watson from [facility] calling on the status of medication metformin to clarify dosage  Phone # [phone] Ref # [id number]

## 2017-03-14 NOTE — Telephone Encounter (Signed)
I called [facility] to clarify & spoke with pharmacist Jenny Reichmann who was going to note correct dosage.

## 2017-03-16 ENCOUNTER — Encounter: Payer: Self-pay | Admitting: Podiatry

## 2017-03-16 ENCOUNTER — Ambulatory Visit (INDEPENDENT_AMBULATORY_CARE_PROVIDER_SITE_OTHER): Payer: Medicare Other

## 2017-03-16 ENCOUNTER — Ambulatory Visit (INDEPENDENT_AMBULATORY_CARE_PROVIDER_SITE_OTHER): Payer: Medicare Other | Admitting: Podiatry

## 2017-03-16 DIAGNOSIS — M2041 Other hammer toe(s) (acquired), right foot: Secondary | ICD-10-CM

## 2017-03-16 DIAGNOSIS — L84 Corns and callosities: Secondary | ICD-10-CM

## 2017-03-16 DIAGNOSIS — M779 Enthesopathy, unspecified: Secondary | ICD-10-CM

## 2017-03-16 MED ORDER — TRIAMCINOLONE ACETONIDE 10 MG/ML IJ SUSP
10.0000 mg | Freq: Once | INTRAMUSCULAR | Status: AC
  Administered 2017-03-16: 10 mg

## 2017-03-17 NOTE — Progress Notes (Signed)
Subjective:   Patient ID: Krystal Watson, female   DOB: 71 y.o.   MRN: [id number]   HPI Patient presents with caregiver stating she has had chronic recurrence of lesion on the right foot and has been very tender and she knows someday she will probably need surgery   ROS      Objective:  Physical Exam  Neurovascular status intact with keratotic lesion fourth digit right with inflammation and fluid at the interphalangeal joint and rotation of the fifth toe     Assessment:  Inflammatory capsulitis interphalangeal joint digit 4 right with rotated fifth digit right     Plan:  Explained hammertoe deformity and discussed ultimate removal of bone that at this time will try conservatively and I did a proximal nerve block and under sterile conditions I carefully injected the interphalangeal joint 1 mg excess of 1 mg Kenalog debrided lesion and applied padding.  Reappoint for [place] to recheck

## 2017-04-11 ENCOUNTER — Ambulatory Visit (INDEPENDENT_AMBULATORY_CARE_PROVIDER_SITE_OTHER): Payer: Medicare Other | Admitting: Podiatry

## 2017-04-11 ENCOUNTER — Encounter: Payer: Self-pay | Admitting: Podiatry

## 2017-04-11 DIAGNOSIS — M2041 Other hammer toe(s) (acquired), right foot: Secondary | ICD-10-CM | POA: Diagnosis not present

## 2017-04-11 DIAGNOSIS — L03031 Cellulitis of right toe: Secondary | ICD-10-CM

## 2017-04-11 DIAGNOSIS — L02611 Cutaneous abscess of right foot: Secondary | ICD-10-CM

## 2017-04-11 MED ORDER — CEPHALEXIN 500 MG PO CAPS: 500.0000 mg | ORAL_CAPSULE | Freq: Three times a day (TID) | ORAL | 1 refills | Status: DC

## 2017-04-11 NOTE — Progress Notes (Signed)
Subjective:   Patient ID: Krystal Watson, female   DOB: 72 y.o.   MRN: [id number]   HPI Patient presents stating the fourth toe on her right foot has been bothering her still and has been red and irritated and she cannot wear shoe gear comfortably   ROS      Objective:  Physical Exam  Neurovascular status intact with inflammation pain around the fourth digit right with slight opening of the area localized in nature with no proximal edema erythema or drainage noted     Assessment:  Acute hammertoe deformity fourth right with pain     Plan:  I explained that this will require arthroplasty but first I want to get any type of possible infection under control and I applied padding between the toes to take pressure off placed on cephalexin 500 mg 3 times daily instructed on soaks and reappoint 2 weeks.  Currently scheduled for surgery next 3-4 weeks depending on response to this conservative treatment

## 2017-04-12 ENCOUNTER — Telehealth: Payer: Self-pay | Admitting: Podiatry

## 2017-04-12 NOTE — Telephone Encounter (Signed)
I'm a pt of Dr. Mellody Drown and I was in the office to see him yesterday. He said he was going to give me an antibiotic and my drugstore has no record of it. Would you ask Dr. Paulla Dolly to go ahead and order the antibiotic or have either a nurse or himself give me a call at [phone]. Thank you.

## 2017-04-13 ENCOUNTER — Telehealth: Payer: Self-pay | Admitting: *Deleted

## 2017-04-13 NOTE — Telephone Encounter (Signed)
Called patient back at [phone] to let them know that Dr. Paulla Dolly had sent in her prescription for Cephalexin, 500 mg, Dispensed 30 with 1 refill to the [facility] on [address] in [place]. Pt verified that this was the correct pharmacy.  Pt will call her pharmacy to check on her prescription. I told pt that if it did not go through properly, to let me know, and I would resubmit it today for them.  Pt stated she understood.

## 2017-04-17 ENCOUNTER — Telehealth: Payer: Self-pay | Admitting: Endocrinology

## 2017-04-17 NOTE — Telephone Encounter (Signed)
Tammy from Dr. Lina Sar office needs clarification on Metformin Er dosage-is she supposed to take 2 tablet 2 x per day or 2 tablets 1 x per day. Please call Tammy at ph# [phone] ext [phone] to clarify

## 2017-04-17 NOTE — Telephone Encounter (Signed)
I have called and clarified with Dr. Lina Sar office. Tammy stated that she would call patient to confirm.

## 2017-04-25 ENCOUNTER — Encounter: Payer: Self-pay | Admitting: Podiatry

## 2017-04-25 ENCOUNTER — Ambulatory Visit (INDEPENDENT_AMBULATORY_CARE_PROVIDER_SITE_OTHER): Payer: Medicare Other | Admitting: Podiatry

## 2017-04-25 DIAGNOSIS — L03031 Cellulitis of right toe: Secondary | ICD-10-CM

## 2017-04-25 DIAGNOSIS — L02611 Cutaneous abscess of right foot: Secondary | ICD-10-CM

## 2017-04-25 DIAGNOSIS — M2041 Other hammer toe(s) (acquired), right foot: Secondary | ICD-10-CM

## 2017-04-25 NOTE — Patient Instructions (Signed)
Pre-Operative Instructions  Congratulations, you have decided to take an important step towards improving your quality of life.  You can be assured that the doctors and staff at [facility] will be with you every step of the way.  Here are some important things you should know:  1. Plan to be at the surgery center/hospital at least 1 (one) hour prior to your scheduled time, unless otherwise directed by the surgical center/hospital staff.  You must have a responsible adult accompany you, remain during the surgery and drive you home.  Make sure you have directions to the surgical center/hospital to ensure you arrive on time. 2. If you are having surgery at [facility] or [facility] hospitals, you will need a copy of your medical history and physical form from your family physician within one month prior to the date of surgery. We will give you a form for your primary physician to complete.  3. We make every effort to accommodate the date you request for surgery.  However, there are times where surgery dates or times have to be moved.  We will contact you as soon as possible if a change in schedule is required.   4. No aspirin/ibuprofen for one week before surgery.  If you are on aspirin, any non-steroidal anti-inflammatory medications (Mobic, Aleve, Ibuprofen) should not be taken seven (7) days prior to your surgery.  You make take Tylenol for pain prior to surgery.  5. Medications - If you are taking daily heart and blood pressure medications, seizure, reflux, allergy, asthma, anxiety, pain or diabetes medications, make sure you notify the surgery center/hospital before the day of surgery so they can tell you which medications you should take or avoid the day of surgery. 6. No food or drink after midnight the night before surgery unless directed otherwise by surgical center/hospital staff. 7. No alcoholic beverages 24-hours prior to surgery.  No smoking 24-hours prior or 24-hours after  surgery. 8. Wear loose pants or shorts. They should be loose enough to fit over bandages, boots, and casts. 9. Don't wear slip-on shoes. Sneakers are preferred. 10. Bring your boot with you to the surgery center/hospital.  Also bring crutches or a walker if your physician has prescribed it for you.  If you do not have this equipment, it will be provided for you after surgery. 11. If you have not been contacted by the surgery center/hospital by the day before your surgery, call to confirm the date and time of your surgery. 12. Leave-time from work may vary depending on the type of surgery you have.  Appropriate arrangements should be made prior to surgery with your employer. 13. Prescriptions will be provided immediately following surgery by your doctor.  Fill these as soon as possible after surgery and take the medication as directed. Pain medications will not be refilled on weekends and must be approved by the doctor. 14. Remove nail polish on the operative foot and avoid getting pedicures prior to surgery. 15. Wash the night before surgery.  The night before surgery wash the foot and leg well with water and the antibacterial soap provided. Be sure to pay special attention to beneath the toenails and in between the toes.  Wash for at least three (3) minutes. Rinse thoroughly with water and dry well with a towel.  Perform this wash unless told not to do so by your physician.  Enclosed: 1 Ice pack (please put in freezer the night before surgery)   1 Hibiclens skin cleaner     Pre-op instructions  If you have any questions regarding the instructions, please do not hesitate to call our office.  [place]: [address], [place], [place] [address] -- [phone]  [place]: [address] [place], [place] [address] -- [phone]  [place]: [address]  [place], [place] [address] -- [phone]  [place]: [address], [address], [place], [place] [address] -- [phone]  Website:  [email or web address] 

## 2017-04-26 NOTE — Progress Notes (Signed)
Subjective:   Patient ID: Krystal Watson, female   DOB: 72 y.o.   MRN: [id number]   HPI Patient presents stating that her fourth and fifth toes are really bothering her and making it hard to wear shoe gear comfortably.  States that she is tried to trim and padding without relief    ROS      Objective:  Physical Exam  Chronic hammertoe deformity fourth and fifth digit right with chronic lesion formation and pain with palpation with patient who has diabetes and is under reasonably good control and has good digital perfusion with good digital flow noted     Assessment:  Hammertoe deformity digits 4 5 right with keratotic lesion formation     Plan:  Reviewed condition and at this time recommended surgical correction with arthroplasty procedures fourth and fifth toes.  I explained the procedures and risk and patient wants surgery and I allowed her to read consent form going over alternative treatments complications.  Patient is scheduled for outpatient surgery at this time was given all preoperative instructions understand is no guarantee as to result in all complications can occur and understands total recovery can take 6 months and is scheduled for outpatient surgery encouraged to call with questions

## 2017-05-09 ENCOUNTER — Ambulatory Visit (INDEPENDENT_AMBULATORY_CARE_PROVIDER_SITE_OTHER): Payer: Medicare Other | Admitting: Endocrinology

## 2017-05-09 ENCOUNTER — Encounter: Payer: Self-pay | Admitting: Endocrinology

## 2017-05-09 VITALS — BP 132/84 | HR 73 | Ht 62.0 in | Wt 238.6 lb

## 2017-05-09 DIAGNOSIS — R809 Proteinuria, unspecified: Secondary | ICD-10-CM | POA: Diagnosis not present

## 2017-05-09 DIAGNOSIS — E1129 Type 2 diabetes mellitus with other diabetic kidney complication: Secondary | ICD-10-CM | POA: Diagnosis not present

## 2017-05-09 LAB — POCT GLYCOSYLATED HEMOGLOBIN (HGB A1C): Hemoglobin A1C: 7.3

## 2017-05-09 MED ORDER — REPAGLINIDE 2 MG PO TABS: 2.0000 mg | ORAL_TABLET | Freq: Three times a day (TID) | ORAL | 11 refills | Status: DC

## 2017-05-09 NOTE — Progress Notes (Signed)
Subjective:    Patient ID: Krystal Watson, female    DOB: 23-Dec-1945, 72 y.o.   MRN: [id number]  HPI Pt returns for f/u of diabetes mellitus: DM type: 2 Dx'ed: 4315  Complications: nephropathy Therapy: 3 oral meds GDM: never DKA: never Severe hypoglycemia: never.  Pancreatitis: never.  Pancreatic imaging: normal on 2010 CT  Other: she declines weight loss surgery, at least for now Interval history: she brings a record of her cbg's which I have reviewed today.  It is approx 200.  There is no trend throughout the day.  pt states she feels well in general. Past Medical History:  Diagnosis Date  . Allergy    sulfa  . Arthritis   . Cancer (HCC)    breast  . Chronic pain   . Diabetes mellitus   . Diverticulosis   . Gallstones   . GERD (gastroesophageal reflux disease)   . Hyperlipidemia   . Hypertension   . Obesity   . Shortness of breath    with exertion    Past Surgical History:  Procedure Laterality Date  . ABDOMINAL HYSTERECTOMY    . CHOLECYSTECTOMY    . COLONOSCOPY    . ERCP     Hx 2x  . SIMPLE MASTECTOMY WITH AXILLARY SENTINEL NODE BIOPSY  04/24/2012   Procedure: SIMPLE MASTECTOMY WITH AXILLARY SENTINEL NODE BIOPSY;  Surgeon: Haywood Lasso, MD;  Location: [facility];  Service: General;  Laterality: Bilateral;  Bilateral total mastectomy and bilateral sentinel node    Social History   Socioeconomic History  . Marital status: Married    Spouse name: Not on file  . Number of children: 2  . Years of education: Not on file  . Highest education level: Not on file  Social Needs  . Financial resource strain: Not on file  . Food insecurity - worry: Not on file  . Food insecurity - inability: Not on file  . Transportation needs - medical: Not on file  . Transportation needs - non-medical: Not on file  Occupational History  . Occupation: Pharmacist, hospital  Tobacco Use  . Smoking status: Never Smoker  . Smokeless tobacco: Never Used  Substance and Sexual Activity  .  Alcohol use: No    Comment: rare  . Drug use: No  . Sexual activity: Yes  Other Topics Concern  . Not on file  Social History Narrative  . Not on file    Current Outpatient Medications on File Prior to Visit  Medication Sig Dispense Refill  . Alum Hydroxide-Mag Carbonate (GAVISCON PO) Take 2 tablets by mouth daily as needed. For stomach    . aspirin 81 MG tablet Take 81 mg by mouth daily.    [name] atorvastatin (LIPITOR) 40 MG tablet Take 40 mg by mouth daily.    . Calcium Carbonate-Vitamin D (CALTRATE 600+D) 600-400 MG-UNIT tablet Take 1 tablet by mouth 2 (two) times daily. (Patient taking differently: Take 1 tablet by mouth daily. )    . CINNAMON PO Take 2 capsules by mouth daily.    [name] diltiazem (DILACOR XR) 240 MG 24 hr capsule Take 240 mg by mouth daily.    [name] esomeprazole (NEXIUM) 20 MG capsule Take 1 capsule (20 mg total) by mouth daily at 12 noon.    . hydrochlorothiazide (HYDRODIURIL) 25 MG tablet Take 25 mg by mouth daily.    [name] ibandronate (BONIVA) 150 MG tablet Take 150 mg by mouth every 30 (thirty) days. Take in the morning with a full glass  of water, on an empty stomach, and do not take anything else by mouth or lie down for the next 30 min.    . metFORMIN (GLUCOPHAGE-XR) 500 MG 24 hr tablet Take 2 tablets (1,000 mg total) by mouth daily. 180 tablet 3  . Multiple Vitamins-Minerals (ONE-A-DAY WEIGHT SMART ADVANCE) TABS Take 1 tablet by mouth daily.    . phenylephrine (SUDAFED PE) 10 MG TABS Take 10 mg by mouth every 4 (four) hours as needed. For allergies    . pioglitazone (ACTOS) 15 MG tablet Take 1 tablet (15 mg total) by mouth daily. 90 tablet 3  . PROAIR HFA 108 (90 BASE) MCG/ACT inhaler inhale 2 puffs by mouth every 6 hours for 10 days  0  . ramipril (ALTACE) 10 MG tablet Take 10 mg by mouth daily.    . sitaGLIPtin (JANUVIA) 100 MG tablet Take 1 tablet (100 mg total) by mouth daily. 90 tablet 3  . UNABLE TO FIND Rx: L8000-Post Surgical Bras (Quantity: 6) W2585- Silicone Breast  Prosthesis (Quantity: 2) Dx: 174.9; bilateral mastectomies 1 each 0  . ferrous sulfate 325 (65 FE) MG tablet Take 325 mg by mouth daily with breakfast.     No current facility-administered medications on file prior to visit.     Allergies  Allergen Reactions  . Sulfonamide Derivatives     Pt can not remember the reaction    Family History  Problem Relation Age of Onset  . Colon cancer Father 78  . Diabetes Mother   . Heart disease Mother   . Breast cancer Sister   . Esophageal cancer Neg Hx   . Stomach cancer Neg Hx   . Rectal cancer Neg Hx     BP 132/84 (BP Location: Left Arm, Patient Position: Sitting, Cuff Size: Large)   Pulse 73   Ht 5\' 2"  (1.575 m)   Wt 238 lb 9.6 oz (108.2 kg)   SpO2 98%   BMI 43.64 kg/m   Review of Systems She denies hypoglycemia.      Objective:   Physical Exam VITAL SIGNS:  See vs page GENERAL: no distress Pulses: dorsalis pedis intact bilat.   MSK: no deformity of the feet CV: trace bilat leg leg edema Skin:  no ulcer on the feet.  normal color and temp on the feet. Neuro: sensation is intact to touch on the feet.   Ext: both great toenails are absent.   Lab Results  Component Value Date   CREATININE 0.68 09/23/2014   BUN 11 09/23/2014   NA 140 09/23/2014   K 3.5 09/23/2014   CL 97 09/23/2014   CO2 33 (H) 09/23/2014   Lab Results  Component Value Date   HGBA1C 7.3 05/09/2017       Assessment & Plan:  Type 2 DM, with nephropathy: she needs increased rx, if it can be done with a regimen that avoids or minimizes hypoglycemia.    Patient Instructions  check your blood sugar once a day.  vary the time of day when you check, between before the 3 meals, and at bedtime.  also check if you have symptoms of your blood sugar being too high or too low.  please keep a record of the readings and bring it to your next appointment here (or you can bring the meter itself).  You can write it on any piece of paper.  please call us sooner if  your blood sugar goes below 70, or if you have a lot of readings over  200.  I have sent a prescription to your pharmacy, to add "repaglinide." There are other medications we can add if necessary. Please come back for a follow-up appointment in 2 months.

## 2017-05-09 NOTE — Patient Instructions (Addendum)
check your blood sugar once a day.  vary the time of day when you check, between before the 3 meals, and at bedtime.  also check if you have symptoms of your blood sugar being too high or too low.  please keep a record of the readings and bring it to your next appointment here (or you can bring the meter itself).  You can write it on any piece of paper.  please call us sooner if your blood sugar goes below 70, or if you have a lot of readings over 200.  I have sent a prescription to your pharmacy, to add "repaglinide." There are other medications we can add if necessary. Please come back for a follow-up appointment in 2 months.

## 2017-05-10 ENCOUNTER — Ambulatory Visit: Payer: Medicare Other | Admitting: Podiatry

## 2017-05-22 ENCOUNTER — Telehealth: Payer: Self-pay | Admitting: *Deleted

## 2017-05-22 ENCOUNTER — Encounter: Payer: Self-pay | Admitting: Podiatry

## 2017-05-22 DIAGNOSIS — M2041 Other hammer toe(s) (acquired), right foot: Secondary | ICD-10-CM | POA: Diagnosis not present

## 2017-05-22 NOTE — Telephone Encounter (Signed)
Pt states she did not get a soap in her surgery packet, did get a brush what is she to do.

## 2017-05-23 ENCOUNTER — Telehealth: Payer: Self-pay | Admitting: *Deleted

## 2017-05-23 NOTE — Telephone Encounter (Signed)
Patient called again regarding this question and I informed her that the scrub brush is embedded with an antiseptic.

## 2017-05-23 NOTE — Telephone Encounter (Signed)
Spoke with today and stated that I was calling to check up on her to see how she was doing and patient stated that she was doing fine and was not in any pain and there was no fever, chills, nausea and patient was elevating the foot and I stated that if any concerns or questions to call the office or could go to the emergency room. Lattie Haw

## 2017-05-30 ENCOUNTER — Ambulatory Visit (INDEPENDENT_AMBULATORY_CARE_PROVIDER_SITE_OTHER): Payer: Medicare Other

## 2017-05-30 ENCOUNTER — Ambulatory Visit (INDEPENDENT_AMBULATORY_CARE_PROVIDER_SITE_OTHER): Payer: Medicare Other | Admitting: Podiatry

## 2017-05-30 ENCOUNTER — Encounter: Payer: Self-pay | Admitting: Podiatry

## 2017-05-30 VITALS — BP 137/87 | HR 87 | Temp 98.3°F

## 2017-05-30 DIAGNOSIS — M2041 Other hammer toe(s) (acquired), right foot: Secondary | ICD-10-CM | POA: Diagnosis not present

## 2017-05-30 NOTE — Progress Notes (Signed)
Subjective:   Patient ID: Krystal Watson, female   DOB: 72 y.o.   MRN: [id number]   HPI Patient states doing really well with her surgery with minimal discomfort   ROS      Objective:  Physical Exam  Neurovascular status intact with digits 4 5 right healing well with wound edges well coapted digits in good alignment     Assessment:  Doing well post arthroplasty digits 4 5 in the right foot     Plan:  Reviewed condition and recommended continued compression elevation and reapplied sterile dressing.  Reappoint 2 weeks for suture removal or earlier if needed  X-ray indicates that the digits are in good alignment with stitches in place wound edges well coapted and satisfactory resection of bone

## 2017-06-13 ENCOUNTER — Ambulatory Visit (INDEPENDENT_AMBULATORY_CARE_PROVIDER_SITE_OTHER): Payer: Medicare Other

## 2017-06-13 ENCOUNTER — Ambulatory Visit (INDEPENDENT_AMBULATORY_CARE_PROVIDER_SITE_OTHER): Payer: Self-pay | Admitting: Podiatry

## 2017-06-13 DIAGNOSIS — L02611 Cutaneous abscess of right foot: Secondary | ICD-10-CM

## 2017-06-13 DIAGNOSIS — M2041 Other hammer toe(s) (acquired), right foot: Secondary | ICD-10-CM | POA: Diagnosis not present

## 2017-06-13 DIAGNOSIS — L03031 Cellulitis of right toe: Secondary | ICD-10-CM | POA: Diagnosis not present

## 2017-06-13 NOTE — Progress Notes (Signed)
Patient presents stating my toes are doing well with minimal discomfortSubjective:   Patient ID: Krystal Watson, female   DOB: 72 y.o.   MRN: [id number]   HPI And I am walking without pain   ROS      Objective:  Physical Exam  Neurovascular status intact with digital 4 and 5 right wound edges well coapted toes in good alignment with no indications of pathology     Assessment:  Doing well post arthroplasty digits 4 5 of the right foot H&P condition reviewed and recommended gradual return to soft shoe gear     Plan:   Patient is doing very well postoperatively with good alignment noted and good digital position

## 2017-06-18 ENCOUNTER — Other Ambulatory Visit: Payer: Self-pay

## 2017-06-18 MED ORDER — REPAGLINIDE 2 MG PO TABS: 2.0000 mg | ORAL_TABLET | Freq: Three times a day (TID) | ORAL | 11 refills | Status: DC

## 2017-06-18 MED ORDER — METFORMIN HCL ER 500 MG PO TB24: 1000.0000 mg | ORAL_TABLET | Freq: Every day | ORAL | 3 refills | Status: DC

## 2017-07-06 ENCOUNTER — Encounter: Payer: Self-pay | Admitting: Endocrinology

## 2017-07-06 ENCOUNTER — Ambulatory Visit (INDEPENDENT_AMBULATORY_CARE_PROVIDER_SITE_OTHER): Payer: Medicare Other | Admitting: Endocrinology

## 2017-07-06 VITALS — BP 138/84 | HR 67 | Wt 233.8 lb

## 2017-07-06 DIAGNOSIS — E1129 Type 2 diabetes mellitus with other diabetic kidney complication: Secondary | ICD-10-CM | POA: Diagnosis not present

## 2017-07-06 DIAGNOSIS — R809 Proteinuria, unspecified: Secondary | ICD-10-CM

## 2017-07-06 LAB — POCT GLYCOSYLATED HEMOGLOBIN (HGB A1C): Hemoglobin A1C: 7.1

## 2017-07-06 NOTE — Patient Instructions (Addendum)
check your blood sugar once a day.  vary the time of day when you check, between before the 3 meals, and at bedtime.  also check if you have symptoms of your blood sugar being too high or too low.  please keep a record of the readings and bring it to your next appointment here (or you can bring the meter itself).  You can write it on any piece of paper.  please call us sooner if your blood sugar goes below 70, or if you have a lot of readings over 200.  Please continue the same medications.  There are other medications we can add if necessary. Please come back for a follow-up appointment in 3-4 months.

## 2017-07-06 NOTE — Progress Notes (Signed)
Subjective:    Patient ID: Krystal Watson, female    DOB: Oct 27, 1945, 72 y.o.   MRN: [id number]  HPI Pt returns for f/u of diabetes mellitus: DM type: 2 Dx'ed: 2992  Complications: nephropathy Therapy: 4 oral meds GDM: never DKA: never Severe hypoglycemia: never.  Pancreatitis: never.  Pancreatic imaging: normal on 2010 CT  Other: she declines weight loss surgery, at least for now Interval history: pt says cbg's vary 120-170.  There is no trend throughout the day.  pt states she feels well in general.  Pt says she just restarted the repaglinide.   Past Medical History:  Diagnosis Date  . Allergy    sulfa  . Arthritis   . Cancer (HCC)    breast  . Chronic pain   . Diabetes mellitus   . Diverticulosis   . Gallstones   . GERD (gastroesophageal reflux disease)   . Hyperlipidemia   . Hypertension   . Obesity   . Shortness of breath    with exertion    Past Surgical History:  Procedure Laterality Date  . ABDOMINAL HYSTERECTOMY    . CHOLECYSTECTOMY    . COLONOSCOPY    . ERCP     Hx 2x  . SIMPLE MASTECTOMY WITH AXILLARY SENTINEL NODE BIOPSY  04/24/2012   Procedure: SIMPLE MASTECTOMY WITH AXILLARY SENTINEL NODE BIOPSY;  Surgeon: Haywood Lasso, MD;  Location: [facility];  Service: General;  Laterality: Bilateral;  Bilateral total mastectomy and bilateral sentinel node    Social History   Socioeconomic History  . Marital status: Married    Spouse name: Not on file  . Number of children: 2  . Years of education: Not on file  . Highest education level: Not on file  Occupational History  . Occupation: Pharmacist, hospital  Social Needs  . Financial resource strain: Not on file  . Food insecurity:    Worry: Not on file    Inability: Not on file  . Transportation needs:    Medical: Not on file    Non-medical: Not on file  Tobacco Use  . Smoking status: Never Smoker  . Smokeless tobacco: Never Used  Substance and Sexual Activity  . Alcohol use: No    Comment: rare  . Drug  use: No  . Sexual activity: Yes  Lifestyle  . Physical activity:    Days per week: Not on file    Minutes per session: Not on file  . Stress: Not on file  Relationships  . Social connections:    Talks on phone: Not on file    Gets together: Not on file    Attends religious service: Not on file    Active member of club or organization: Not on file    Attends meetings of clubs or organizations: Not on file    Relationship status: Not on file  . Intimate partner violence:    Fear of current or ex partner: Not on file    Emotionally abused: Not on file    Physically abused: Not on file    Forced sexual activity: Not on file  Other Topics Concern  . Not on file  Social History Narrative  . Not on file    Current Outpatient Medications on File Prior to Visit  Medication Sig Dispense Refill  . Alum Hydroxide-Mag Carbonate (GAVISCON PO) Take 2 tablets by mouth daily as needed. For stomach    . aspirin 81 MG tablet Take 81 mg by mouth daily.    [name]  atorvastatin (LIPITOR) 40 MG tablet Take 40 mg by mouth daily.    . Calcium Carbonate-Vitamin D (CALTRATE 600+D) 600-400 MG-UNIT tablet Take 1 tablet by mouth 2 (two) times daily. (Patient taking differently: Take 1 tablet by mouth daily. )    . CINNAMON PO Take 2 capsules by mouth daily.    [name] diltiazem (DILACOR XR) 240 MG 24 hr capsule Take 240 mg by mouth daily.    [name] esomeprazole (NEXIUM) 20 MG capsule Take 1 capsule (20 mg total) by mouth daily at 12 noon.    . ferrous sulfate 325 (65 FE) MG tablet Take 325 mg by mouth daily with breakfast.    . hydrochlorothiazide (HYDRODIURIL) 25 MG tablet Take 25 mg by mouth daily.    [name] ibandronate (BONIVA) 150 MG tablet Take 150 mg by mouth every 30 (thirty) days. Take in the morning with a full glass of water, on an empty stomach, and do not take anything else by mouth or lie down for the next 30 min.    . metFORMIN (GLUCOPHAGE-XR) 500 MG 24 hr tablet Take 2 tablets (1,000 mg total) by mouth daily. 180  tablet 3  . Multiple Vitamins-Minerals (ONE-A-DAY WEIGHT SMART ADVANCE) TABS Take 1 tablet by mouth daily.    . phenylephrine (SUDAFED PE) 10 MG TABS Take 10 mg by mouth every 4 (four) hours as needed. For allergies    . pioglitazone (ACTOS) 15 MG tablet Take 1 tablet (15 mg total) by mouth daily. 90 tablet 3  . PROAIR HFA 108 (90 BASE) MCG/ACT inhaler inhale 2 puffs by mouth every 6 hours for 10 days  0  . ramipril (ALTACE) 10 MG tablet Take 10 mg by mouth daily.    . repaglinide (PRANDIN) 2 MG tablet Take 1 tablet (2 mg total) by mouth 3 (three) times daily before meals. 90 tablet 11  . sitaGLIPtin (JANUVIA) 100 MG tablet Take 1 tablet (100 mg total) by mouth daily. 90 tablet 3  . UNABLE TO FIND Rx: L8000-Post Surgical Bras (Quantity: 6) Q2595- Silicone Breast Prosthesis (Quantity: 2) Dx: 174.9; bilateral mastectomies 1 each 0   No current facility-administered medications on file prior to visit.     Allergies  Allergen Reactions  . Sulfonamide Derivatives     Pt can not remember the reaction    Family History  Problem Relation Age of Onset  . Colon cancer Father 74  . Diabetes Mother   . Heart disease Mother   . Breast cancer Sister   . Esophageal cancer Neg Hx   . Stomach cancer Neg Hx   . Rectal cancer Neg Hx     BP 138/84 (BP Location: Left Arm, Patient Position: Sitting, Cuff Size: Normal)   Pulse 67   Wt 233 lb 12.8 oz (106.1 kg)   SpO2 97%   BMI 42.76 kg/m    Review of Systems She denies hypoglycemia.  She has lost 5 lbs.     Objective:   Physical Exam VITAL SIGNS:  See vs page GENERAL: no distress Pulses: dorsalis pedis intact bilat.   MSK: no deformity of the feet CV: trace bilat leg leg edema Skin:  no ulcer on the feet.  normal color and temp on the feet. Neuro: sensation is intact to touch on the feet.   Ext: both great toenails are absent.    A1c=7.1%     Assessment & Plan:  Type 2 DM: well-controlled Weight loss: given she just started the  repaglinide, this is the likely  reason for good glycemic control  Patient Instructions  check your blood sugar once a day.  vary the time of day when you check, between before the 3 meals, and at bedtime.  also check if you have symptoms of your blood sugar being too high or too low.  please keep a record of the readings and bring it to your next appointment here (or you can bring the meter itself).  You can write it on any piece of paper.  please call us sooner if your blood sugar goes below 70, or if you have a lot of readings over 200.  Please continue the same medications.  There are other medications we can add if necessary. Please come back for a follow-up appointment in 3-4 months.

## 2017-10-17 ENCOUNTER — Ambulatory Visit: Payer: Medicare Other | Admitting: Endocrinology

## 2017-10-26 ENCOUNTER — Encounter: Payer: Self-pay | Admitting: Adult Health

## 2017-10-26 ENCOUNTER — Telehealth: Payer: Self-pay | Admitting: Adult Health

## 2017-10-26 ENCOUNTER — Inpatient Hospital Stay: Payer: Medicare Other | Attending: Adult Health | Admitting: Adult Health

## 2017-10-26 VITALS — BP 144/55 | HR 56 | Temp 98.4°F | Resp 17 | Ht 62.0 in | Wt 236.6 lb

## 2017-10-26 DIAGNOSIS — Z853 Personal history of malignant neoplasm of breast: Secondary | ICD-10-CM | POA: Insufficient documentation

## 2017-10-26 DIAGNOSIS — Z170 Estrogen receptor positive status [ER+]: Secondary | ICD-10-CM

## 2017-10-26 DIAGNOSIS — C50411 Malignant neoplasm of upper-outer quadrant of right female breast: Secondary | ICD-10-CM

## 2017-10-26 NOTE — Patient Instructions (Signed)
Bone Health Bones protect organs, store calcium, and anchor muscles. Good health habits, such as eating nutritious foods and exercising regularly, are important for maintaining healthy bones. They can also help to prevent a condition that causes bones to lose density and become weak and brittle (osteoporosis). Why is bone mass important? Bone mass refers to the amount of bone tissue that you have. The higher your bone mass, the stronger your bones. An important step toward having healthy bones throughout life is to have strong and dense bones during childhood. A young adult who has a high bone mass is more likely to have a high bone mass later in life. Bone mass at its greatest it is called peak bone mass. A large decline in bone mass occurs in older adults. In women, it occurs about the time of menopause. During this time, it is important to practice good health habits, because if more bone is lost than what is replaced, the bones will become less healthy and more likely to break (fracture). If you find that you have a low bone mass, you may be able to prevent osteoporosis or further bone loss by changing your diet and lifestyle. How can I find out if my bone mass is low? Bone mass can be measured with an X-ray test that is called a bone mineral density (BMD) test. This test is recommended for all women who are age 65 or older. It may also be recommended for men who are age 70 or older, or for people who are more likely to develop osteoporosis due to:  Having bones that break easily.  Having a long-term disease that weakens bones, such as kidney disease or rheumatoid arthritis.  Having menopause earlier than normal.  Taking medicine that weakens bones, such as steroids, thyroid hormones, or hormone treatment for breast cancer or prostate cancer.  Smoking.  Drinking three or more alcoholic drinks each day.  What are the nutritional recommendations for healthy bones? To have healthy bones, you  need to get enough of the right minerals and vitamins. Most nutrition experts recommend getting these nutrients from the foods that you eat. Nutritional recommendations vary from person to person. Ask your health care provider what is healthy for you. Here are some general guidelines. Calcium Recommendations Calcium is the most important (essential) mineral for bone health. Most people can get enough calcium from their diet, but supplements may be recommended for people who are at risk for osteoporosis. Good sources of calcium include:  Dairy products, such as low-fat or nonfat milk, cheese, and yogurt.  Dark green leafy vegetables, such as bok choy and broccoli.  Calcium-fortified foods, such as orange juice, cereal, bread, soy beverages, and tofu products.  Nuts, such as almonds.  Follow these recommended amounts for daily calcium intake:  Children, age 1?3: 700 mg.  Children, age 4?8: 1,000 mg.  Children, age 9?13: 1,300 mg.  Teens, age 14?18: 1,300 mg.  Adults, age 19?50: 1,000 mg.  Adults, age 51?70: ? Men: 1,000 mg. ? Women: 1,200 mg.  Adults, age 71 or older: 1,200 mg.  Pregnant and breastfeeding females: ? Teens: 1,300 mg. ? Adults: 1,000 mg.  Vitamin D Recommendations Vitamin D is the most essential vitamin for bone health. It helps the body to absorb calcium. Sunlight stimulates the skin to make vitamin D, so be sure to get enough sunlight. If you live in a cold climate or you do not get outside often, your health care provider may recommend that you take vitamin   D supplements. Good sources of vitamin D in your diet include:  Egg yolks.  Saltwater fish.  Milk and cereal fortified with vitamin D.  Follow these recommended amounts for daily vitamin D intake:  Children and teens, age 1?18: 600 international units.  Adults, age 50 or younger: 400-800 international units.  Adults, age 51 or older: 800-1,000 international units.  Other Nutrients Other nutrients  for bone health include:  Phosphorus. This mineral is found in meat, poultry, dairy foods, nuts, and legumes. The recommended daily intake for adult men and adult women is 700 mg.  Magnesium. This mineral is found in seeds, nuts, dark green vegetables, and legumes. The recommended daily intake for adult men is 400?420 mg. For adult women, it is 310?320 mg.  Vitamin K. This vitamin is found in green leafy vegetables. The recommended daily intake is 120 mg for adult men and 90 mg for adult women.  What type of physical activity is best for building and maintaining healthy bones? Weight-bearing and strength-building activities are important for building and maintaining peak bone mass. Weight-bearing activities cause muscles and bones to work against gravity. Strength-building activities increases muscle strength that supports bones. Weight-bearing and muscle-building activities include:  Walking and hiking.  Jogging and running.  Dancing.  Gym exercises.  Lifting weights.  Tennis and racquetball.  Climbing stairs.  Aerobics.  Adults should get at least 30 minutes of moderate physical activity on most days. Children should get at least 60 minutes of moderate physical activity on most days. Ask your health care provide what type of exercise is best for you. Where can I find more information? For more information, check out the following websites:  National Osteoporosis Foundation: http://nof.org/learn/basics  National Institutes of Health: http://www.niams.nih.gov/Health_Info/Bone/Bone_Health/bone_health_for_life.asp  This information is not intended to replace advice given to you by your health care provider. Make sure you discuss any questions you have with your health care provider. Document Released: 06/10/2003 Document Revised: 10/08/2015 Document Reviewed: 03/25/2014 Elsevier Interactive Patient Education  2018 Elsevier Inc.  

## 2017-10-26 NOTE — Progress Notes (Signed)
CLINIC:  Survivorship   REASON FOR VISIT:  Routine follow-up for history of breast cancer.   BRIEF ONCOLOGIC HISTORY:    Breast cancer, right breast, UOQ, DCIS   03/12/2012 Initial Diagnosis    ductal carcinoma in situ with comedonecrosis and calcifications. Tumor was ER negative PR negative      04/24/2012 Surgery    Left Mastectomy: DCIS with comedonecrosis and calcifications. ER 32% PR negative; right mastectomy grade 3 multifocal DCIS 2.6, 1.5, and 10.0 cm. 1SLN neg, ER/PR Neg        INTERVAL HISTORY:  Krystal Watson presents to the [facility] today for routine follow-up for her history of breast cancer.  Overall, she reports feeling quite well.   She denies any issues today.  She sees PCP regularly and is up to date with her cancer screenings.  She does not require mammograms due to h/o bilateral mastectomies.    She exercises regularly with walking daily.  She also stays active by keeping her grandchildren.   REVIEW OF SYSTEMS:  Review of Systems  Constitutional: Negative for appetite change, fatigue, fever and unexpected weight change.  HENT:   Negative for hearing loss, lump/mass, sore throat and trouble swallowing.   Eyes: Negative for eye problems and icterus.  Respiratory: Negative for chest tightness, cough and shortness of breath.   Cardiovascular: Negative for chest pain, leg swelling and palpitations.  Gastrointestinal: Negative for abdominal distention, abdominal pain, constipation, diarrhea, nausea and vomiting.  Endocrine: Negative for hot flashes.  Skin: Negative for itching and rash.  Neurological: Negative for dizziness, extremity weakness and numbness.  Hematological: Negative for adenopathy. Does not bruise/bleed easily.  Psychiatric/Behavioral: Negative for depression. The patient is not nervous/anxious.   Breast: Denies any new nodularity, masses, tenderness, nipple changes, or nipple discharge.       PAST MEDICAL/SURGICAL HISTORY:    Past Medical History:  Diagnosis Date  . Allergy    sulfa  . Arthritis   . Cancer (HCC)    breast  . Chronic pain   . Diabetes mellitus   . Diverticulosis   . Gallstones   . GERD (gastroesophageal reflux disease)   . Hyperlipidemia   . Hypertension   . Obesity   . Shortness of breath    with exertion   Past Surgical History:  Procedure Laterality Date  . ABDOMINAL HYSTERECTOMY    . CHOLECYSTECTOMY    . COLONOSCOPY    . ERCP     Hx 2x  . SIMPLE MASTECTOMY WITH AXILLARY SENTINEL NODE BIOPSY  04/24/2012   Procedure: SIMPLE MASTECTOMY WITH AXILLARY SENTINEL NODE BIOPSY;  Surgeon: Haywood Lasso, MD;  Location: [facility];  Service: General;  Laterality: Bilateral;  Bilateral total mastectomy and bilateral sentinel node     ALLERGIES:  Allergies  Allergen Reactions  . Sulfonamide Derivatives     Pt can not remember the reaction     CURRENT MEDICATIONS:  Outpatient Encounter Medications as of 10/26/2017  Medication Sig Note  . Alum Hydroxide-Mag Carbonate (GAVISCON PO) Take 2 tablets by mouth daily as needed. For stomach   . aspirin 81 MG tablet Take 81 mg by mouth daily.   [name] atorvastatin (LIPITOR) 40 MG tablet Take 40 mg by mouth daily.   . Calcium Carbonate-Vitamin D (CALTRATE 600+D) 600-400 MG-UNIT tablet Take 1 tablet by mouth 2 (two) times daily. (Patient taking differently: Take 1 tablet by mouth daily. )   . diltiazem (DILACOR XR) 240 MG 24 hr capsule Take 240 mg by  mouth daily.   [name] esomeprazole (NEXIUM) 20 MG capsule Take 1 capsule (20 mg total) by mouth daily at 12 noon.   . hydrochlorothiazide (HYDRODIURIL) 25 MG tablet Take 25 mg by mouth daily.   [name] ibandronate (BONIVA) 150 MG tablet Take 150 mg by mouth every 30 (thirty) days. Take in the morning with a full glass of water, on an empty stomach, and do not take anything else by mouth or lie down for the next 30 min.   . metFORMIN (GLUCOPHAGE) 500 MG tablet Take 500 mg by mouth 2 (two) times daily with a meal.   .  Multiple Vitamins-Minerals (ONE-A-DAY WEIGHT SMART ADVANCE) TABS Take 1 tablet by mouth daily.   . phenylephrine (SUDAFED PE) 10 MG TABS Take 10 mg by mouth every 4 (four) hours as needed. For allergies   . pioglitazone (ACTOS) 15 MG tablet Take 1 tablet (15 mg total) by mouth daily.   [name] PROAIR HFA 108 (90 BASE) MCG/ACT inhaler inhale 2 puffs by mouth every 6 hours for 10 days 09/23/2014: Received from: External Pharmacy  . ramipril (ALTACE) 10 MG tablet Take 10 mg by mouth daily.   . repaglinide (PRANDIN) 2 MG tablet Take 1 tablet (2 mg total) by mouth 3 (three) times daily before meals.   . sitaGLIPtin (JANUVIA) 100 MG tablet Take 1 tablet (100 mg total) by mouth daily.   [name] UNABLE TO FIND Rx: L8000-Post Surgical Bras (Quantity: 6) E5631- Silicone Breast Prosthesis (Quantity: 2) Dx: 174.9; bilateral mastectomies   . [DISCONTINUED] CINNAMON PO Take 2 capsules by mouth daily.   . [DISCONTINUED] ferrous sulfate 325 (65 FE) MG tablet Take 325 mg by mouth daily with breakfast.   . [DISCONTINUED] metFORMIN (GLUCOPHAGE-XR) 500 MG 24 hr tablet Take 2 tablets (1,000 mg total) by mouth daily. (Patient not taking: Reported on 10/26/2017)    No facility-administered encounter medications on file as of 10/26/2017.      ONCOLOGIC FAMILY HISTORY:  Family History  Problem Relation Age of Onset  . Colon cancer Father 74  . Diabetes Mother   . Heart disease Mother   . Breast cancer Sister   . Esophageal cancer Neg Hx   . Stomach cancer Neg Hx   . Rectal cancer Neg Hx      SOCIAL HISTORY:  Social History   Socioeconomic History  . Marital status: Married    Spouse name: Not on file  . Number of children: 2  . Years of education: Not on file  . Highest education level: Not on file  Occupational History  . Occupation: Pharmacist, hospital  Social Needs  . Financial resource strain: Not on file  . Food insecurity:    Worry: Not on file    Inability: Not on file  . Transportation needs:    Medical: Not on  file    Non-medical: Not on file  Tobacco Use  . Smoking status: Never Smoker  . Smokeless tobacco: Never Used  Substance and Sexual Activity  . Alcohol use: No    Comment: rare  . Drug use: No  . Sexual activity: Yes  Lifestyle  . Physical activity:    Days per week: Not on file    Minutes per session: Not on file  . Stress: Not on file  Relationships  . Social connections:    Talks on phone: Not on file    Gets together: Not on file    Attends religious service: Not on file    Active member of  club or organization: Not on file    Attends meetings of clubs or organizations: Not on file    Relationship status: Not on file  . Intimate partner violence:    Fear of current or ex partner: Not on file    Emotionally abused: Not on file    Physically abused: Not on file    Forced sexual activity: Not on file  Other Topics Concern  . Not on file  Social History Narrative  . Not on file      PHYSICAL EXAMINATION:  Vital Signs: Vitals:   10/26/17 1115  BP: (!) 144/55  Pulse: (!) 56  Resp: 17  Temp: 98.4 F (36.9 C)  SpO2: 100%   Filed Weights   10/26/17 1115  Weight: 236 lb 9.6 oz (107.3 kg)   General: Well-nourished, well-appearing female in no acute distress.  Unaccompanied today.   HEENT: Head is normocephalic.  Pupils equal and reactive to light. Conjunctivae clear without exudate.  Sclerae anicteric. Oral mucosa is pink, moist.  Oropharynx is pink without lesions or erythema.  Lymph: No cervical, supraclavicular, or infraclavicular lymphadenopathy noted on palpation.  Cardiovascular: Regular rate and rhythm.[name] Respiratory: Clear to auscultation bilaterally. Chest expansion symmetric; breathing non-labored.  Breast Exam:  -Bilateral breasts are surgically absent, no nodules, masses, skin thickening/tenderness or sign of recurrence present. -Axilla: No axillary adenopathy bilaterally.  GI: Abdomen soft and round; non-tender, non-distended. Bowel sounds normoactive.  No hepatosplenomegaly.   GU: Deferred.  Neuro: No focal deficits. Steady gait.  Psych: Mood and affect normal and appropriate for situation.  MSK: No focal spinal tenderness to palpation, full range of motion in bilateral upper extremities Extremities: No edema. Skin: Warm and dry.  LABORATORY DATA:  None for this visit   DIAGNOSTIC IMAGING:  Most recent mammogram: n/a s/p bilateral mastectomies    ASSESSMENT AND PLAN:  Krystal Watson is a pleasant 72 y.o. female with history of Stage 0 bilateral breast DCIS, ER-/PR-, diagnosed in 04/2012, treated with bilateral mastectomies.  [facility] for surveillance and routine follow-up.   1. History of breast cancer:  Krystal Watson is currently clinically and radiographically without evidence of disease or recurrence of breast cancer. S.  I encouraged her to call me with any questions or concerns before her next visit at the cancer center, and I would be happy to see her sooner, if needed.    2. Bone health:  Given Krystal Watson age, history of breast cancer, she is at risk for bone demineralization. She will f/u with her PCP about this. She was given education on specific food and activities to promote bone health.  3. Cancer screening:  Due to Krystal Watson history and her age, she should receive screening for skin cancers, colon cancer. She was encouraged to follow-up with her PCP for appropriate cancer screenings.   4. Health maintenance and wellness promotion: Krystal Watson was encouraged to consume 5-7 servings of fruits and vegetables per day. She was also encouraged to engage in moderate to vigorous exercise for 30 minutes per day most days of the week. She was instructed to limit her alcohol consumption and continue to abstain from tobacco use.    Dispo:  -Return to cancer center in one year for LTS follow up   A total of (20) minutes of face-to-face time was spent with this patient with greater than 50% of that time  in counseling and care-coordination.   Krystal Phlegm, [facility] [phone]   Note: PRIMARY CARE PROVIDER [place], [place] [phone]

## 2017-10-26 NOTE — Telephone Encounter (Signed)
Per 7/25 los.  Gave patient avs and calendar.

## 2017-11-21 ENCOUNTER — Ambulatory Visit (INDEPENDENT_AMBULATORY_CARE_PROVIDER_SITE_OTHER): Payer: Medicare Other | Admitting: Endocrinology

## 2017-11-21 ENCOUNTER — Encounter: Payer: Self-pay | Admitting: Endocrinology

## 2017-11-21 VITALS — BP 132/82 | HR 77 | Ht 62.0 in | Wt 240.4 lb

## 2017-11-21 DIAGNOSIS — R809 Proteinuria, unspecified: Secondary | ICD-10-CM | POA: Diagnosis not present

## 2017-11-21 DIAGNOSIS — E1129 Type 2 diabetes mellitus with other diabetic kidney complication: Secondary | ICD-10-CM

## 2017-11-21 LAB — POCT GLYCOSYLATED HEMOGLOBIN (HGB A1C): Hemoglobin A1C: 6.3 % — AB (ref 4.0–5.6)

## 2017-11-21 NOTE — Patient Instructions (Signed)
check your blood sugar once a day.  vary the time of day when you check, between before the 3 meals, and at bedtime.  also check if you have symptoms of your blood sugar being too high or too low.  please keep a record of the readings and bring it to your next appointment here (or you can bring the meter itself).  You can write it on any piece of paper.  please call us sooner if your blood sugar goes below 70, or if you have a lot of readings over 200.  Please stop taking the pioglitazone, and: Please continue the same other diabetes medications.  Please come back for a follow-up appointment in 3-4 months.

## 2017-11-21 NOTE — Progress Notes (Signed)
Subjective:    Patient ID: Krystal Watson, female    DOB: [birth year], 72 y.o.   MRN: [id number]  HPI Pt returns for f/u of diabetes mellitus: DM type: 2 Dx'ed: 2947  Complications: nephropathy Therapy: 4 oral meds GDM: never DKA: never Severe hypoglycemia: never.  Pancreatitis: never.  Pancreatic imaging: normal on 2010 CT  Other: she declines weight loss surgery, at least for now Interval history: pt says cbg's vary 127-167.  It is in general higher as the day goes on.  pt states she feels well in general, except leg edema is worse.  Past Medical History:  Diagnosis Date  . Allergy    sulfa  . Arthritis   . Cancer (HCC)    breast  . Chronic pain   . Diabetes mellitus   . Diverticulosis   . Gallstones   . GERD (gastroesophageal reflux disease)   . Hyperlipidemia   . Hypertension   . Obesity   . Shortness of breath    with exertion    Past Surgical History:  Procedure Laterality Date  . ABDOMINAL HYSTERECTOMY    . CHOLECYSTECTOMY    . COLONOSCOPY    . ERCP     Hx 2x  . SIMPLE MASTECTOMY WITH AXILLARY SENTINEL NODE BIOPSY  04/24/2012   Procedure: SIMPLE MASTECTOMY WITH AXILLARY SENTINEL NODE BIOPSY;  Surgeon: Haywood Lasso, MD;  Location: [facility];  Service: General;  Laterality: Bilateral;  Bilateral total mastectomy and bilateral sentinel node    Social History   Socioeconomic History  . Marital status: Married    Spouse name: Not on file  . Number of children: 2  . Years of education: Not on file  . Highest education level: Not on file  Occupational History  . Occupation: Pharmacist, hospital  Social Needs  . Financial resource strain: Not on file  . Food insecurity:    Worry: Not on file    Inability: Not on file  . Transportation needs:    Medical: Not on file    Non-medical: Not on file  Tobacco Use  . Smoking status: Never Smoker  . Smokeless tobacco: Never Used  Substance and Sexual Activity  . Alcohol use: No    Comment: rare  . Drug use: No  .  Sexual activity: Yes  Lifestyle  . Physical activity:    Days per week: Not on file    Minutes per session: Not on file  . Stress: Not on file  Relationships  . Social connections:    Talks on phone: Not on file    Gets together: Not on file    Attends religious service: Not on file    Active member of club or organization: Not on file    Attends meetings of clubs or organizations: Not on file    Relationship status: Not on file  . Intimate partner violence:    Fear of current or ex partner: Not on file    Emotionally abused: Not on file    Physically abused: Not on file    Forced sexual activity: Not on file  Other Topics Concern  . Not on file  Social History Narrative  . Not on file    Current Outpatient Medications on File Prior to Visit  Medication Sig Dispense Refill  . Alum Hydroxide-Mag Carbonate (GAVISCON PO) Take 2 tablets by mouth daily as needed. For stomach    . aspirin 81 MG tablet Take 81 mg by mouth daily.    [name] atorvastatin (  LIPITOR) 40 MG tablet Take 40 mg by mouth daily.    . Calcium Carbonate-Vitamin D (CALTRATE 600+D) 600-400 MG-UNIT tablet Take 1 tablet by mouth 2 (two) times daily. (Patient taking differently: Take 1 tablet by mouth daily. )    . diltiazem (DILACOR XR) 240 MG 24 hr capsule Take 240 mg by mouth daily.    [name] esomeprazole (NEXIUM) 20 MG capsule Take 1 capsule (20 mg total) by mouth daily at 12 noon.    . hydrochlorothiazide (HYDRODIURIL) 25 MG tablet Take 25 mg by mouth daily.    [name] ibandronate (BONIVA) 150 MG tablet Take 150 mg by mouth every 30 (thirty) days. Take in the morning with a full glass of water, on an empty stomach, and do not take anything else by mouth or lie down for the next 30 min.    . metFORMIN (GLUCOPHAGE) 500 MG tablet Take 500 mg by mouth 2 (two) times daily with a meal.    . Multiple Vitamins-Minerals (ONE-A-DAY WEIGHT SMART ADVANCE) TABS Take 1 tablet by mouth daily.    . phenylephrine (SUDAFED PE) 10 MG TABS Take 10 mg  by mouth every 4 (four) hours as needed. For allergies    . PROAIR HFA 108 (90 BASE) MCG/ACT inhaler inhale 2 puffs by mouth every 6 hours for 10 days  0  . ramipril (ALTACE) 10 MG tablet Take 10 mg by mouth daily.    . repaglinide (PRANDIN) 2 MG tablet Take 1 tablet (2 mg total) by mouth 3 (three) times daily before meals. 90 tablet 11  . sitaGLIPtin (JANUVIA) 100 MG tablet Take 1 tablet (100 mg total) by mouth daily. 90 tablet 3  . UNABLE TO FIND Rx: L8000-Post Surgical Bras (Quantity: 6) W5809- Silicone Breast Prosthesis (Quantity: 2) Dx: 174.9; bilateral mastectomies 1 each 0   No current facility-administered medications on file prior to visit.     Allergies  Allergen Reactions  . Sulfonamide Derivatives     Pt can not remember the reaction    Family History  Problem Relation Age of Onset  . Colon cancer Father 6  . Diabetes Mother   . Heart disease Mother   . Breast cancer Sister   . Esophageal cancer Neg Hx   . Stomach cancer Neg Hx   . Rectal cancer Neg Hx     BP 132/82 (BP Location: Right Arm, Patient Position: Sitting, Cuff Size: Normal)   Pulse 77   Ht 5\' 2"  (1.575 m)   Wt 240 lb 6.4 oz (109 kg)   SpO2 98%   BMI 43.97 kg/m    Review of Systems She denies hypoglycemia and sob    Objective:   Physical Exam VITAL SIGNS:  See vs page GENERAL: no distress Pulses: dorsalis pedis intact bilat.   MSK: no deformity of the feet CV: 1+ bilat leg leg edema Skin:  no ulcer on the feet.  normal color and temp on the feet. Neuro: sensation is intact to touch on the feet.   Ext: both great toenails are absent.    Lab Results  Component Value Date   HGBA1C 6.3 (A) 11/21/2017   Lab Results  Component Value Date   CREATININE 0.68 09/23/2014   BUN 11 09/23/2014   NA 140 09/23/2014   K 3.5 09/23/2014   CL 97 09/23/2014   CO2 33 (H) 09/23/2014      Assessment & Plan:  Type 2 DM: well-controlled Edema: worse.  Due to pioglitazone  Patient Instructions  check your blood sugar once a day.  vary the time of day when you check, between before the 3 meals, and at bedtime.  also check if you have symptoms of your blood sugar being too high or too low.  please keep a record of the readings and bring it to your next appointment here (or you can bring the meter itself).  You can write it on any piece of paper.  please call us sooner if your blood sugar goes below 70, or if you have a lot of readings over 200.  Please stop taking the pioglitazone, and: Please continue the same other diabetes medications.  Please come back for a follow-up appointment in 3-4 months.

## 2018-02-21 ENCOUNTER — Encounter: Payer: Self-pay | Admitting: Endocrinology

## 2018-02-21 ENCOUNTER — Ambulatory Visit (INDEPENDENT_AMBULATORY_CARE_PROVIDER_SITE_OTHER): Payer: Medicare Other | Admitting: Endocrinology

## 2018-02-21 VITALS — BP 158/88 | HR 82 | Ht 62.0 in | Wt 239.0 lb

## 2018-02-21 DIAGNOSIS — R809 Proteinuria, unspecified: Secondary | ICD-10-CM | POA: Diagnosis not present

## 2018-02-21 DIAGNOSIS — E1129 Type 2 diabetes mellitus with other diabetic kidney complication: Secondary | ICD-10-CM | POA: Diagnosis not present

## 2018-02-21 LAB — POCT GLYCOSYLATED HEMOGLOBIN (HGB A1C): HEMOGLOBIN A1C: 7.6 % — AB (ref 4.0–5.6)

## 2018-02-21 MED ORDER — EMPAGLIFLOZIN 10 MG PO TABS: 10.0000 mg | ORAL_TABLET | Freq: Every day | ORAL | 11 refills | Status: DC

## 2018-02-21 MED ORDER — REPAGLINIDE 2 MG PO TABS: 2.0000 mg | ORAL_TABLET | Freq: Three times a day (TID) | ORAL | 11 refills | Status: DC

## 2018-02-21 MED ORDER — SITAGLIPTIN PHOSPHATE 100 MG PO TABS: 100.0000 mg | ORAL_TABLET | Freq: Every day | ORAL | 3 refills | Status: DC

## 2018-02-21 MED ORDER — METFORMIN HCL 500 MG PO TABS: 500.0000 mg | ORAL_TABLET | Freq: Two times a day (BID) | ORAL | 3 refills | Status: DC

## 2018-02-21 NOTE — Patient Instructions (Addendum)
check your blood sugar once a day.  vary the time of day when you check, between before the 3 meals, and at bedtime.  also check if you have symptoms of your blood sugar being too high or too low.  please keep a record of the readings and bring it to your next appointment here (or you can bring the meter itself).  You can write it on any piece of paper.  please call us sooner if your blood sugar goes below 70, or if you have a lot of readings over 200.  I have sent a prescription to your pharmacy, to add "jardiance." Please continue the same other diabetes medications.  Your blood pressure is high today.  Please see Dr Delfina Redwood soon, to have it rechecked.   Please come back for a follow-up appointment in 2 months.

## 2018-02-21 NOTE — Progress Notes (Signed)
Subjective:    Patient ID: Krystal Watson, female    DOB: 06-30-1945, 72 y.o.   MRN: [id number]  HPI Pt returns for f/u of diabetes mellitus: DM type: 2 Dx'ed: 5284  Complications: nephropathy Therapy: 4 oral meds GDM: never DKA: never Severe hypoglycemia: never.  Pancreatitis: never.  Pancreatic imaging: normal on 2010 CT  Other: she declines weight loss surgery, at least for now: she did not tolerate pioglitazone (edema).   Interval history: pt says cbg's vary 139-175 fasting. She does not check at other times of day.  pt states she feels well in general.  Pt says she often misses the repaglinide.   Past Medical History:  Diagnosis Date  . Allergy    sulfa  . Arthritis   . Cancer (HCC)    breast  . Chronic pain   . Diabetes mellitus   . Diverticulosis   . Gallstones   . GERD (gastroesophageal reflux disease)   . Hyperlipidemia   . Hypertension   . Obesity   . Shortness of breath    with exertion    Past Surgical History:  Procedure Laterality Date  . ABDOMINAL HYSTERECTOMY    . CHOLECYSTECTOMY    . COLONOSCOPY    . ERCP     Hx 2x  . SIMPLE MASTECTOMY WITH AXILLARY SENTINEL NODE BIOPSY  04/24/2012   Procedure: SIMPLE MASTECTOMY WITH AXILLARY SENTINEL NODE BIOPSY;  Surgeon: Haywood Lasso, MD;  Location: [facility];  Service: General;  Laterality: Bilateral;  Bilateral total mastectomy and bilateral sentinel node    Social History   Socioeconomic History  . Marital status: Married    Spouse name: Not on file  . Number of children: 2  . Years of education: Not on file  . Highest education level: Not on file  Occupational History  . Occupation: Pharmacist, hospital  Social Needs  . Financial resource strain: Not on file  . Food insecurity:    Worry: Not on file    Inability: Not on file  . Transportation needs:    Medical: Not on file    Non-medical: Not on file  Tobacco Use  . Smoking status: Never Smoker  . Smokeless tobacco: Never Used  Substance and Sexual  Activity  . Alcohol use: No    Comment: rare  . Drug use: No  . Sexual activity: Yes  Lifestyle  . Physical activity:    Days per week: Not on file    Minutes per session: Not on file  . Stress: Not on file  Relationships  . Social connections:    Talks on phone: Not on file    Gets together: Not on file    Attends religious service: Not on file    Active member of club or organization: Not on file    Attends meetings of clubs or organizations: Not on file    Relationship status: Not on file  . Intimate partner violence:    Fear of current or ex partner: Not on file    Emotionally abused: Not on file    Physically abused: Not on file    Forced sexual activity: Not on file  Other Topics Concern  . Not on file  Social History Narrative  . Not on file    Current Outpatient Medications on File Prior to Visit  Medication Sig Dispense Refill  . Alum Hydroxide-Mag Carbonate (GAVISCON PO) Take 2 tablets by mouth daily as needed. For stomach    . aspirin 81 MG tablet  Take 81 mg by mouth daily.    [name] atorvastatin (LIPITOR) 40 MG tablet Take 40 mg by mouth daily.    . Calcium Carbonate-Vitamin D (CALTRATE 600+D) 600-400 MG-UNIT tablet Take 1 tablet by mouth 2 (two) times daily. (Patient taking differently: Take 1 tablet by mouth daily. )    . diltiazem (DILACOR XR) 240 MG 24 hr capsule Take 240 mg by mouth daily.    [name] esomeprazole (NEXIUM) 20 MG capsule Take 1 capsule (20 mg total) by mouth daily at 12 noon.    . hydrochlorothiazide (HYDRODIURIL) 25 MG tablet Take 25 mg by mouth daily.    [name] ibandronate (BONIVA) 150 MG tablet Take 150 mg by mouth every 30 (thirty) days. Take in the morning with a full glass of water, on an empty stomach, and do not take anything else by mouth or lie down for the next 30 min.    . Multiple Vitamins-Minerals (ONE-A-DAY WEIGHT SMART ADVANCE) TABS Take 1 tablet by mouth daily.    . phenylephrine (SUDAFED PE) 10 MG TABS Take 10 mg by mouth every 4 (four) hours  as needed. For allergies    . PROAIR HFA 108 (90 BASE) MCG/ACT inhaler inhale 2 puffs by mouth every 6 hours for 10 days  0  . ramipril (ALTACE) 10 MG tablet Take 10 mg by mouth daily.    [name] UNABLE TO FIND Rx: L8000-Post Surgical Bras (Quantity: 6) Q2595- Silicone Breast Prosthesis (Quantity: 2) Dx: 174.9; bilateral mastectomies 1 each 0   No current facility-administered medications on file prior to visit.     Allergies  Allergen Reactions  . Sulfonamide Derivatives     Pt can not remember the reaction    Family History  Problem Relation Age of Onset  . Colon cancer Father 64  . Diabetes Mother   . Heart disease Mother   . Breast cancer Sister   . Esophageal cancer Neg Hx   . Stomach cancer Neg Hx   . Rectal cancer Neg Hx     BP (!) 158/88 (BP Location: Left Arm, Patient Position: Sitting, Cuff Size: Large)   Pulse 82   Ht 5\' 2"  (1.575 m)   Wt 239 lb (108.4 kg)   SpO2 95%   BMI 43.71 kg/m    Review of Systems She denies hypoglycemia    Objective:   Physical Exam VITAL SIGNS:  See vs page GENERAL: no distress Pulses: dorsalis pedis intact bilat.   MSK: no deformity of the feet CV: trace bilat leg edema Skin:  no ulcer on the feet.  normal color and temp on the feet.  Neuro: sensation is intact to touch on the feet Ext: There is bilateral onychomycosis of the toenails.  Left great toenail is absent.   Lab Results  Component Value Date   CREATININE 0.68 09/23/2014   BUN 11 09/23/2014   NA 140 09/23/2014   K 3.5 09/23/2014   CL 97 09/23/2014   CO2 33 (H) 09/23/2014    A1c=7.6%    Assessment & Plan:  Type 2 DM, with nephropathy: worse Edema: resolved off pioglitazone. HTN: is noted today.   Patient Instructions  check your blood sugar once a day.  vary the time of day when you check, between before the 3 meals, and at bedtime.  also check if you have symptoms of your blood sugar being too high or too low.  please keep a record of the readings and bring  it to your next appointment here (or  you can bring the meter itself).  You can write it on any piece of paper.  please call us sooner if your blood sugar goes below 70, or if you have a lot of readings over 200.  I have sent a prescription to your pharmacy, to add "jardiance." Please continue the same other diabetes medications.  Your blood pressure is high today.  Please see Dr Delfina Redwood soon, to have it rechecked.   Please come back for a follow-up appointment in 2 months.

## 2018-04-18 ENCOUNTER — Ambulatory Visit
Admission: RE | Admit: 2018-04-18 | Discharge: 2018-04-18 | Disposition: A | Discharge status: Home / Self Care | Payer: Medicare Other | Source: Ambulatory Visit | Attending: Internal Medicine | Admitting: Internal Medicine

## 2018-04-18 ENCOUNTER — Other Ambulatory Visit: Payer: Self-pay | Admitting: Internal Medicine

## 2018-04-18 DIAGNOSIS — R202 Paresthesia of skin: Secondary | ICD-10-CM

## 2018-04-18 DIAGNOSIS — M546 Pain in thoracic spine: Secondary | ICD-10-CM

## 2018-04-25 ENCOUNTER — Ambulatory Visit (INDEPENDENT_AMBULATORY_CARE_PROVIDER_SITE_OTHER): Payer: Medicare Other | Admitting: Endocrinology

## 2018-04-25 VITALS — BP 144/88 | HR 83 | Ht 62.0 in | Wt 230.6 lb

## 2018-04-25 DIAGNOSIS — R809 Proteinuria, unspecified: Secondary | ICD-10-CM

## 2018-04-25 DIAGNOSIS — E1129 Type 2 diabetes mellitus with other diabetic kidney complication: Secondary | ICD-10-CM | POA: Diagnosis not present

## 2018-04-25 LAB — POCT GLYCOSYLATED HEMOGLOBIN (HGB A1C): HEMOGLOBIN A1C: 9.8 % — AB (ref 4.0–5.6)

## 2018-04-25 MED ORDER — GLIMEPIRIDE 4 MG PO TABS: 4.0000 mg | ORAL_TABLET | Freq: Every day | ORAL | 3 refills | Status: DC

## 2018-04-25 NOTE — Progress Notes (Signed)
Subjective:    Patient ID: Krystal Watson, female    DOB: 09-Mar-1946, 73 y.o.   MRN: [id number]  HPI Pt returns for f/u of diabetes mellitus: DM type: 2 Dx'ed: 3220  Complications: nephropathy Therapy: 4 oral meds GDM: never DKA: never Severe hypoglycemia: never.  Pancreatitis: never.  Pancreatic imaging: normal on 2010 CT  Other: she declines weight loss surgery, at least for now: she did not tolerate pioglitazone (edema); she has never been on insulin.    Interval history: pt says cbg's are in the low-200's fasting.  No recent steroids.  She does not check at other times of day.  pt states she feels well in general.  Pt says she still often misses the repaglinide.   Past Medical History:  Diagnosis Date  . Allergy    sulfa  . Arthritis   . Cancer (HCC)    breast  . Chronic pain   . Diabetes mellitus   . Diverticulosis   . Gallstones   . GERD (gastroesophageal reflux disease)   . Hyperlipidemia   . Hypertension   . Obesity   . Shortness of breath    with exertion    Past Surgical History:  Procedure Laterality Date  . ABDOMINAL HYSTERECTOMY    . CHOLECYSTECTOMY    . COLONOSCOPY    . ERCP     Hx 2x  . SIMPLE MASTECTOMY WITH AXILLARY SENTINEL NODE BIOPSY  04/24/2012   Procedure: SIMPLE MASTECTOMY WITH AXILLARY SENTINEL NODE BIOPSY;  Surgeon: Haywood Lasso, MD;  Location: [facility];  Service: General;  Laterality: Bilateral;  Bilateral total mastectomy and bilateral sentinel node    Social History   Socioeconomic History  . Marital status: Married    Spouse name: Not on file  . Number of children: 2  . Years of education: Not on file  . Highest education level: Not on file  Occupational History  . Occupation: Pharmacist, hospital  Social Needs  . Financial resource strain: Not on file  . Food insecurity:    Worry: Not on file    Inability: Not on file  . Transportation needs:    Medical: Not on file    Non-medical: Not on file  Tobacco Use  . Smoking status: Never  Smoker  . Smokeless tobacco: Never Used  Substance and Sexual Activity  . Alcohol use: No    Comment: rare  . Drug use: No  . Sexual activity: Yes  Lifestyle  . Physical activity:    Days per week: Not on file    Minutes per session: Not on file  . Stress: Not on file  Relationships  . Social connections:    Talks on phone: Not on file    Gets together: Not on file    Attends religious service: Not on file    Active member of club or organization: Not on file    Attends meetings of clubs or organizations: Not on file    Relationship status: Not on file  . Intimate partner violence:    Fear of current or ex partner: Not on file    Emotionally abused: Not on file    Physically abused: Not on file    Forced sexual activity: Not on file  Other Topics Concern  . Not on file  Social History Narrative  . Not on file    Current Outpatient Medications on File Prior to Visit  Medication Sig Dispense Refill  . Alum Hydroxide-Mag Carbonate (GAVISCON PO) Take 2 tablets  by mouth daily as needed. For stomach    . aspirin 81 MG tablet Take 81 mg by mouth daily.    [name] atorvastatin (LIPITOR) 40 MG tablet Take 40 mg by mouth daily.    . Calcium Carbonate-Vitamin D (CALTRATE 600+D) 600-400 MG-UNIT tablet Take 1 tablet by mouth 2 (two) times daily. (Patient taking differently: Take 1 tablet by mouth daily. )    . diltiazem (DILACOR XR) 240 MG 24 hr capsule Take 240 mg by mouth daily.    . empagliflozin (JARDIANCE) 10 MG TABS tablet Take 10 mg by mouth daily. 30 tablet 11  . esomeprazole (NEXIUM) 20 MG capsule Take 1 capsule (20 mg total) by mouth daily at 12 noon.    . hydrochlorothiazide (HYDRODIURIL) 25 MG tablet Take 25 mg by mouth daily.    [name] ibandronate (BONIVA) 150 MG tablet Take 150 mg by mouth every 30 (thirty) days. Take in the morning with a full glass of water, on an empty stomach, and do not take anything else by mouth or lie down for the next 30 min.    . metFORMIN (GLUCOPHAGE) 500  MG tablet Take 1 tablet (500 mg total) by mouth 2 (two) times daily with a meal. 180 tablet 3  . Multiple Vitamins-Minerals (ONE-A-DAY WEIGHT SMART ADVANCE) TABS Take 1 tablet by mouth daily.    . phenylephrine (SUDAFED PE) 10 MG TABS Take 10 mg by mouth every 4 (four) hours as needed. For allergies    . PROAIR HFA 108 (90 BASE) MCG/ACT inhaler inhale 2 puffs by mouth every 6 hours for 10 days  0  . ramipril (ALTACE) 10 MG tablet Take 10 mg by mouth daily.    . sitaGLIPtin (JANUVIA) 100 MG tablet Take 1 tablet (100 mg total) by mouth daily. 90 tablet 3  . UNABLE TO FIND Rx: L8000-Post Surgical Bras (Quantity: 6) Z6109- Silicone Breast Prosthesis (Quantity: 2) Dx: 174.9; bilateral mastectomies 1 each 0   No current facility-administered medications on file prior to visit.     Allergies  Allergen Reactions  . Sulfonamide Derivatives     Pt can not remember the reaction    Family History  Problem Relation Age of Onset  . Colon cancer Father 37  . Diabetes Mother   . Heart disease Mother   . Breast cancer Sister   . Esophageal cancer Neg Hx   . Stomach cancer Neg Hx   . Rectal cancer Neg Hx     BP (!) 144/88 (BP Location: Left Arm, Patient Position: Sitting, Cuff Size: Large) Comment: has not taken antihypertensive  Pulse 83   Ht 5\' 2"  (1.575 m)   Wt 230 lb 9.6 oz (104.6 kg)   SpO2 93%   BMI 42.18 kg/m    Review of Systems She denies hypoglycemia.      Objective:   Physical Exam VITAL SIGNS:  See vs page GENERAL: no distress Pulses: dorsalis pedis intact bilat.   MSK: no deformity of the feet CV: no leg edema Skin:  no ulcer on the feet.  normal color and temp on the feet. Neuro: sensation is intact to touch on the feet.   Lab Results  Component Value Date   CREATININE 0.68 09/23/2014   BUN 11 09/23/2014   NA 140 09/23/2014   K 3.5 09/23/2014   CL 97 09/23/2014   CO2 33 (H) 09/23/2014   Lab Results  Component Value Date   HGBA1C 9.8 (A) 04/25/2018  Assessment & Plan:  Type 2 DM, with DN: worse Edema: this limits rx options Noncompliance with cbg recording and medication.  We'll change to qd rx.    Patient Instructions  I have sent a prescription to your pharmacy, to change repaglinde to glimepiride. Please continue the same other diabetes medications. check your blood sugar once a day.  vary the time of day when you check, between before the 3 meals, and at bedtime.  also check if you have symptoms of your blood sugar being too high or too low.  please keep a record of the readings and bring it to your next appointment here (or you can bring the meter itself).  You can write it on any piece of paper.  please call us sooner if your blood sugar goes below 70, or if you have a lot of readings over 200. Please come back for a follow-up appointment in 2 months.      Diabetes Mellitus and Nutrition, Adult When you have diabetes (diabetes mellitus), it is very important to have healthy eating habits because your blood sugar (glucose) levels are greatly affected by what you eat and drink. Eating healthy foods in the appropriate amounts, at about the same times every day, can help you:  Control your blood glucose.  Lower your risk of heart disease.  Improve your blood pressure.  Reach or maintain a healthy weight. Every person with diabetes is different, and each person has different needs for a meal plan. Your health care provider may recommend that you work with a diet and nutrition specialist (dietitian) to make a meal plan that is best for you. Your meal plan may vary depending on factors such as:  The calories you need.  The medicines you take.  Your weight.  Your blood glucose, blood pressure, and cholesterol levels.  Your activity level.  Other health conditions you have, such as heart or kidney disease. How do carbohydrates affect me? Carbohydrates, also called carbs, affect your blood glucose level more than any other  type of food. Eating carbs naturally raises the amount of glucose in your blood. Carb counting is a method for keeping track of how many carbs you eat. Counting carbs is important to keep your blood glucose at a healthy level, especially if you use insulin or take certain oral diabetes medicines. It is important to know how many carbs you can safely have in each meal. This is different for every person. Your dietitian can help you calculate how many carbs you should have at each meal and for each snack. Foods that contain carbs include:  Bread, cereal, rice, pasta, and crackers.  Potatoes and corn.  Peas, beans, and lentils.  Milk and yogurt.  Fruit and juice.  Desserts, such as cakes, cookies, ice cream, and candy. How does alcohol affect me? Alcohol can cause a sudden decrease in blood glucose (hypoglycemia), especially if you use insulin or take certain oral diabetes medicines. Hypoglycemia can be a life-threatening condition. Symptoms of hypoglycemia (sleepiness, dizziness, and confusion) are similar to symptoms of having too much alcohol. If your health care provider says that alcohol is safe for you, follow these guidelines:  Limit alcohol intake to no more than 1 drink per day for nonpregnant women and 2 drinks per day for men. One drink equals 12 oz of beer, 5 oz of wine, or 1 oz of hard liquor.  Do not drink on an empty stomach.  Keep yourself hydrated with water, diet soda, or unsweetened  iced tea.  Keep in mind that regular soda, juice, and other mixers may contain a lot of sugar and must be counted as carbs. What are tips for following this plan?  Reading food labels  Start by checking the serving size on the "Nutrition Facts" label of packaged foods and drinks. The amount of calories, carbs, fats, and other nutrients listed on the label is based on one serving of the item. Many items contain more than one serving per package.  Check the total grams (g) of carbs in one  serving. You can calculate the number of servings of carbs in one serving by dividing the total carbs by 15. For example, if a food has 30 g of total carbs, it would be equal to 2 servings of carbs.  Check the number of grams (g) of saturated and trans fats in one serving. Choose foods that have low or no amount of these fats.  Check the number of milligrams (mg) of salt (sodium) in one serving. Most people should limit total sodium intake to less than 2,300 mg per day.  Always check the nutrition information of foods labeled as "low-fat" or "nonfat". These foods may be higher in added sugar or refined carbs and should be avoided.  Talk to your dietitian to identify your daily goals for nutrients listed on the label. Shopping  Avoid buying canned, premade, or processed foods. These foods tend to be high in fat, sodium, and added sugar.  Shop around the outside edge of the grocery store. This includes fresh fruits and vegetables, bulk grains, fresh meats, and fresh dairy. Cooking  Use low-heat cooking methods, such as baking, instead of high-heat cooking methods like deep frying.  Cook using healthy oils, such as olive, canola, or sunflower oil.  Avoid cooking with butter, cream, or high-fat meats. Meal planning  Eat meals and snacks regularly, preferably at the same times every day. Avoid going long periods of time without eating.  Eat foods high in fiber, such as fresh fruits, vegetables, beans, and whole grains. Talk to your dietitian about how many servings of carbs you can eat at each meal.  Eat 4-6 ounces (oz) of lean protein each day, such as lean meat, chicken, fish, eggs, or tofu. One oz of lean protein is equal to: ? 1 oz of meat, chicken, or fish. ? 1 egg. ?  cup of tofu.  Eat some foods each day that contain healthy fats, such as avocado, nuts, seeds, and fish. Lifestyle  Check your blood glucose regularly.  Exercise regularly as told by your health care provider.  This may include: ? 150 minutes of moderate-intensity or vigorous-intensity exercise each week. This could be brisk walking, biking, or water aerobics. ? Stretching and doing strength exercises, such as yoga or weightlifting, at least 2 times a week.  Take medicines as told by your health care provider.  Do not use any products that contain nicotine or tobacco, such as cigarettes and e-cigarettes. If you need help quitting, ask your health care provider.  Work with a Social worker or diabetes educator to identify strategies to manage stress and any emotional and social challenges. Questions to ask a health care provider  Do I need to meet with a diabetes educator?  Do I need to meet with a dietitian?  What number can I call if I have questions?  When are the best times to check my blood glucose? Where to find more information:  American Diabetes Association: diabetes.org  Academy of  Nutrition and Dietetics: www.eatright.[employer] of Diabetes and Digestive and Kidney Diseases (NIH): [employer] Summary  A healthy meal plan will help you control your blood glucose and maintain a healthy lifestyle.  Working with a diet and nutrition specialist (dietitian) can help you make a meal plan that is best for you.  Keep in mind that carbohydrates (carbs) and alcohol have immediate effects on your blood glucose levels. It is important to count carbs and to use alcohol carefully. This information is not intended to replace advice given to you by your health care provider. Make sure you discuss any questions you have with your health care provider. Document Released: 12/15/2004 Document Revised: 10/18/2016 Document Reviewed: 04/24/2016 Elsevier Interactive Patient Education  2019 [employer].

## 2018-04-25 NOTE — Patient Instructions (Addendum)
I have sent a prescription to your pharmacy, to change repaglinde to glimepiride. Please continue the same other diabetes medications. check your blood sugar once a day.  vary the time of day when you check, between before the 3 meals, and at bedtime.  also check if you have symptoms of your blood sugar being too high or too low.  please keep a record of the readings and bring it to your next appointment here (or you can bring the meter itself).  You can write it on any piece of paper.  please call us sooner if your blood sugar goes below 70, or if you have a lot of readings over 200. Please come back for a follow-up appointment in 2 months.      Diabetes Mellitus and Nutrition, Adult When you have diabetes (diabetes mellitus), it is very important to have healthy eating habits because your blood sugar (glucose) levels are greatly affected by what you eat and drink. Eating healthy foods in the appropriate amounts, at about the same times every day, can help you:  Control your blood glucose.  Lower your risk of heart disease.  Improve your blood pressure.  Reach or maintain a healthy weight. Every person with diabetes is different, and each person has different needs for a meal plan. Your health care provider may recommend that you work with a diet and nutrition specialist (dietitian) to make a meal plan that is best for you. Your meal plan may vary depending on factors such as:  The calories you need.  The medicines you take.  Your weight.  Your blood glucose, blood pressure, and cholesterol levels.  Your activity level.  Other health conditions you have, such as heart or kidney disease. How do carbohydrates affect me? Carbohydrates, also called carbs, affect your blood glucose level more than any other type of food. Eating carbs naturally raises the amount of glucose in your blood. Carb counting is a method for keeping track of how many carbs you eat. Counting carbs is important to  keep your blood glucose at a healthy level, especially if you use insulin or take certain oral diabetes medicines. It is important to know how many carbs you can safely have in each meal. This is different for every person. Your dietitian can help you calculate how many carbs you should have at each meal and for each snack. Foods that contain carbs include:  Bread, cereal, rice, pasta, and crackers.  Potatoes and corn.  Peas, beans, and lentils.  Milk and yogurt.  Fruit and juice.  Desserts, such as cakes, cookies, ice cream, and candy. How does alcohol affect me? Alcohol can cause a sudden decrease in blood glucose (hypoglycemia), especially if you use insulin or take certain oral diabetes medicines. Hypoglycemia can be a life-threatening condition. Symptoms of hypoglycemia (sleepiness, dizziness, and confusion) are similar to symptoms of having too much alcohol. If your health care provider says that alcohol is safe for you, follow these guidelines:  Limit alcohol intake to no more than 1 drink per day for nonpregnant women and 2 drinks per day for men. One drink equals 12 oz of beer, 5 oz of wine, or 1 oz of hard liquor.  Do not drink on an empty stomach.  Keep yourself hydrated with water, diet soda, or unsweetened iced tea.  Keep in mind that regular soda, juice, and other mixers may contain a lot of sugar and must be counted as carbs. What are tips for following this plan?  Reading food labels  Start by checking the serving size on the "Nutrition Facts" label of packaged foods and drinks. The amount of calories, carbs, fats, and other nutrients listed on the label is based on one serving of the item. Many items contain more than one serving per package.  Check the total grams (g) of carbs in one serving. You can calculate the number of servings of carbs in one serving by dividing the total carbs by 15. For example, if a food has 30 g of total carbs, it would be equal to 2  servings of carbs.  Check the number of grams (g) of saturated and trans fats in one serving. Choose foods that have low or no amount of these fats.  Check the number of milligrams (mg) of salt (sodium) in one serving. Most people should limit total sodium intake to less than 2,300 mg per day.  Always check the nutrition information of foods labeled as "low-fat" or "nonfat". These foods may be higher in added sugar or refined carbs and should be avoided.  Talk to your dietitian to identify your daily goals for nutrients listed on the label. Shopping  Avoid buying canned, premade, or processed foods. These foods tend to be high in fat, sodium, and added sugar.  Shop around the outside edge of the grocery store. This includes fresh fruits and vegetables, bulk grains, fresh meats, and fresh dairy. Cooking  Use low-heat cooking methods, such as baking, instead of high-heat cooking methods like deep frying.  Cook using healthy oils, such as olive, canola, or sunflower oil.  Avoid cooking with butter, cream, or high-fat meats. Meal planning  Eat meals and snacks regularly, preferably at the same times every day. Avoid going long periods of time without eating.  Eat foods high in fiber, such as fresh fruits, vegetables, beans, and whole grains. Talk to your dietitian about how many servings of carbs you can eat at each meal.  Eat 4-6 ounces (oz) of lean protein each day, such as lean meat, chicken, fish, eggs, or tofu. One oz of lean protein is equal to: ? 1 oz of meat, chicken, or fish. ? 1 egg. ?  cup of tofu.  Eat some foods each day that contain healthy fats, such as avocado, nuts, seeds, and fish. Lifestyle  Check your blood glucose regularly.  Exercise regularly as told by your health care provider. This may include: ? 150 minutes of moderate-intensity or vigorous-intensity exercise each week. This could be brisk walking, biking, or water aerobics. ? Stretching and doing  strength exercises, such as yoga or weightlifting, at least 2 times a week.  Take medicines as told by your health care provider.  Do not use any products that contain nicotine or tobacco, such as cigarettes and e-cigarettes. If you need help quitting, ask your health care provider.  Work with a Social worker or diabetes educator to identify strategies to manage stress and any emotional and social challenges. Questions to ask a health care provider  Do I need to meet with a diabetes educator?  Do I need to meet with a dietitian?  What number can I call if I have questions?  When are the best times to check my blood glucose? Where to find more information:  American Diabetes Association: diabetes.org  Academy of Nutrition and Dietetics: www.eatright.[employer] of Diabetes and Digestive and Kidney Diseases (NIH): [employer] Summary  A healthy meal plan will help you control your blood glucose and maintain a healthy  lifestyle.  Working with a diet and nutrition specialist (dietitian) can help you make a meal plan that is best for you.  Keep in mind that carbohydrates (carbs) and alcohol have immediate effects on your blood glucose levels. It is important to count carbs and to use alcohol carefully. This information is not intended to replace advice given to you by your health care provider. Make sure you discuss any questions you have with your health care provider. Document Released: 12/15/2004 Document Revised: 10/18/2016 Document Reviewed: 04/24/2016 Elsevier Interactive Patient Education  2019 [employer].

## 2018-05-02 ENCOUNTER — Telehealth: Payer: Self-pay | Admitting: Endocrinology

## 2018-05-02 NOTE — Telephone Encounter (Signed)
Returned pt call. Pt will need to contact insurance company to determine what device is covered by her plan. Once she can provide this information, Rx's can be sent for meter, compatible strips and lancets. LVM requesting returned call

## 2018-05-02 NOTE — Telephone Encounter (Signed)
Patient stated that her meter has quit working. She has been unable to check her Blood sugars and she would like to know if we have one in the office she could pick up or if we could send in a new one.   RELY is the name of the meter she said she was using.   She asked if she could get one from her insurance [employer].

## 2018-05-06 ENCOUNTER — Telehealth: Payer: Self-pay | Admitting: Endocrinology

## 2018-05-06 NOTE — Telephone Encounter (Signed)
Returned pt call. Pt will need to contact insurance company to determine what device is covered by her plan. Once she can provide this information, Rx's can be sent for meter, compatible strips and lancets. LVM requesting returned call

## 2018-05-06 NOTE — Telephone Encounter (Signed)
Patient is returning a call from the office

## 2018-05-10 NOTE — Telephone Encounter (Signed)
Patient stated she did not get a call back. Read note provided below. Patient confirmed and accepted.

## 2018-06-27 ENCOUNTER — Ambulatory Visit: Payer: Medicare Other | Admitting: Endocrinology

## 2018-07-01 ENCOUNTER — Ambulatory Visit (INDEPENDENT_AMBULATORY_CARE_PROVIDER_SITE_OTHER): Payer: Medicare Other | Admitting: Endocrinology

## 2018-07-01 ENCOUNTER — Ambulatory Visit: Payer: Medicare Other

## 2018-07-01 ENCOUNTER — Other Ambulatory Visit: Payer: Self-pay

## 2018-07-01 DIAGNOSIS — E1129 Type 2 diabetes mellitus with other diabetic kidney complication: Secondary | ICD-10-CM

## 2018-07-01 DIAGNOSIS — R809 Proteinuria, unspecified: Secondary | ICD-10-CM

## 2018-07-01 LAB — POCT GLYCOSYLATED HEMOGLOBIN (HGB A1C)
HbA1c POC (<> result, manual entry): 9.1 % (ref 4.0–5.6)
Hemoglobin A1C: 9.1 % — AB (ref 4.0–5.6)

## 2018-07-01 NOTE — Progress Notes (Addendum)
Subjective:    Patient ID: Krystal Watson, female    DOB: [birth year], 73 y.o.   MRN: [id number]  HPI  telehealth visit today via doxy video visit.  Alternatives to telehealth are presented to this patient, and the patient agrees to the telehealth visit. Pt is advised of the cost of the visit, and agrees to this, also.   Patient is at home, and I am at the office.   Pt returns for f/u of diabetes mellitus:  DM type: 2 Dx'ed: 5916  Complications: nephropathy Therapy: 3 oral meds.  GDM: never.  DKA: never.  Severe hypoglycemia: never.  Pancreatitis: never.  Pancreatic imaging: normal on 2010 CT  Other: she declines weight loss surgery, at least for now: she did not tolerate pioglitazone (edema); she has never been on insulin.    Interval history: No recent steroids.  She does not take jardiance, due to cost.  She has slight swelling of the legs, but no assoc sob.  She says cbg varies from 193-220.   Past Medical History:  Diagnosis Date  . Allergy    sulfa  . Arthritis   . Cancer (HCC)    breast  . Chronic pain   . Diabetes mellitus   . Diverticulosis   . Gallstones   . GERD (gastroesophageal reflux disease)   . Hyperlipidemia   . Hypertension   . Obesity   . Shortness of breath    with exertion    Past Surgical History:  Procedure Laterality Date  . ABDOMINAL HYSTERECTOMY    . CHOLECYSTECTOMY    . COLONOSCOPY    . ERCP     Hx 2x  . SIMPLE MASTECTOMY WITH AXILLARY SENTINEL NODE BIOPSY  04/24/2012   Procedure: SIMPLE MASTECTOMY WITH AXILLARY SENTINEL NODE BIOPSY;  Surgeon: Haywood Lasso, MD;  Location: [place];  Service: General;  Laterality: Bilateral;  Bilateral total mastectomy and bilateral sentinel node    Social History   Socioeconomic History  . Marital status: Married    Spouse name: Not on file  . Number of children: 2  . Years of education: Not on file  . Highest education level: Not on file  Occupational History  . Occupation: Pharmacist, hospital  Social  Needs  . Financial resource strain: Not on file  . Food insecurity:    Worry: Not on file    Inability: Not on file  . Transportation needs:    Medical: Not on file    Non-medical: Not on file  Tobacco Use  . Smoking status: Never Smoker  . Smokeless tobacco: Never Used  Substance and Sexual Activity  . Alcohol use: No    Comment: rare  . Drug use: No  . Sexual activity: Yes  Lifestyle  . Physical activity:    Days per week: Not on file    Minutes per session: Not on file  . Stress: Not on file  Relationships  . Social connections:    Talks on phone: Not on file    Gets together: Not on file    Attends religious service: Not on file    Active member of club or organization: Not on file    Attends meetings of clubs or organizations: Not on file    Relationship status: Not on file  . Intimate partner violence:    Fear of current or ex partner: Not on file    Emotionally abused: Not on file    Physically abused: Not on file    Forced  sexual activity: Not on file  Other Topics Concern  . Not on file  Social History Narrative  . Not on file    Current Outpatient Medications on File Prior to Visit  Medication Sig Dispense Refill  . Alum Hydroxide-Mag Carbonate (GAVISCON PO) Take 2 tablets by mouth daily as needed. For stomach    . aspirin 81 MG tablet Take 81 mg by mouth daily.    [name] atorvastatin (LIPITOR) 40 MG tablet Take 40 mg by mouth daily.    . Calcium Carbonate-Vitamin D (CALTRATE 600+D) 600-400 MG-UNIT tablet Take 1 tablet by mouth 2 (two) times daily. (Patient taking differently: Take 1 tablet by mouth daily. )    . diltiazem (DILACOR XR) 240 MG 24 hr capsule Take 240 mg by mouth daily.    [name] esomeprazole (NEXIUM) 20 MG capsule Take 1 capsule (20 mg total) by mouth daily at 12 noon.    [name] glimepiride (AMARYL) 4 MG tablet Take 1 tablet (4 mg total) by mouth daily with breakfast. 90 tablet 3  . hydrochlorothiazide (HYDRODIURIL) 25 MG tablet Take 25 mg by mouth daily.     [name] ibandronate (BONIVA) 150 MG tablet Take 150 mg by mouth every 30 (thirty) days. Take in the morning with a full glass of water, on an empty stomach, and do not take anything else by mouth or lie down for the next 30 min.    . metFORMIN (GLUCOPHAGE) 500 MG tablet Take 1 tablet (500 mg total) by mouth 2 (two) times daily with a meal. 180 tablet 3  . Multiple Vitamins-Minerals (ONE-A-DAY WEIGHT SMART ADVANCE) TABS Take 1 tablet by mouth daily.    . phenylephrine (SUDAFED PE) 10 MG TABS Take 10 mg by mouth every 4 (four) hours as needed. For allergies    . PROAIR HFA 108 (90 BASE) MCG/ACT inhaler inhale 2 puffs by mouth every 6 hours for 10 days  0  . ramipril (ALTACE) 10 MG tablet Take 10 mg by mouth daily.    . sitaGLIPtin (JANUVIA) 100 MG tablet Take 1 tablet (100 mg total) by mouth daily. 90 tablet 3  . UNABLE TO FIND Rx: L8000-Post Surgical Bras (Quantity: 6) B1478- Silicone Breast Prosthesis (Quantity: 2) Dx: 174.9; bilateral mastectomies 1 each 0   No current facility-administered medications on file prior to visit.     Allergies  Allergen Reactions  . Sulfonamide Derivatives     Pt can not remember the reaction    Family History  Problem Relation Age of Onset  . Colon cancer Father 15  . Diabetes Mother   . Heart disease Mother   . Breast cancer Sister   . Esophageal cancer Neg Hx   . Stomach cancer Neg Hx   . Rectal cancer Neg Hx     Review of Systems She denies hypoglycemia.  She has lost a few lbs, due to her efforts.      Objective:   Physical Exam  Lab Results  Component Value Date   HGBA1C 9.1 (A) 07/01/2018   HGBA1C 9.1 07/01/2018   Lab Results  Component Value Date   CREATININE 0.68 09/23/2014   BUN 11 09/23/2014   NA 140 09/23/2014   K 3.5 09/23/2014   CL 97 09/23/2014   CO2 33 (H) 09/23/2014       Assessment & Plan:  Type 2 DM, with DN: she needs increased rx.  We discussed resuming jardiance, but she declines.  Edema: This limits rx options.  Patient Instructions  Please continue the same medications. check your blood sugar once a day.  vary the time of day when you check, between before the 3 meals, and at bedtime.  also check if you have symptoms of your blood sugar being too high or too low.  please keep a record of the readings and bring it to your next appointment here (or you can bring the meter itself).  You can write it on any piece of paper.  please call us sooner if your blood sugar goes below 70, or if you have a lot of readings over 200. Please come back for a follow-up appointment in 2 months.

## 2018-07-01 NOTE — Progress Notes (Unsigned)
Pt drove to second floor of parking deck met with pt identified pt. Took pt A1C.  

## 2018-07-01 NOTE — Patient Instructions (Signed)
Please continue the same medications check your blood sugar once a day.  vary the time of day when you check, between before the 3 meals, and at bedtime.  also check if you have symptoms of your blood sugar being too high or too low.  please keep a record of the readings and bring it to your next appointment here (or you can bring the meter itself).  You can write it on any piece of paper.  please call us sooner if your blood sugar goes below 70, or if you have a lot of readings over 200.  Please come back for a follow-up appointment in 2 months.  

## 2018-07-03 ENCOUNTER — Telehealth: Payer: Self-pay | Admitting: Endocrinology

## 2018-07-03 NOTE — Telephone Encounter (Signed)
ramipril (ALTACE) 10 MG tablet   Patient is still waiting on a prescription to be sent into the pharmacy. She said this was not sent at her last visit      [place], [place]

## 2018-07-04 NOTE — Telephone Encounter (Signed)
Dr. Loanne Drilling please advise is this refill appropriate, you have never refilled before.

## 2018-07-04 NOTE — Telephone Encounter (Signed)
Dr polite handles this medication

## 2018-08-21 ENCOUNTER — Encounter: Payer: Self-pay | Admitting: Endocrinology

## 2018-08-21 LAB — HM DIABETES EYE EXAM

## 2018-10-10 ENCOUNTER — Other Ambulatory Visit: Payer: Self-pay

## 2018-10-14 ENCOUNTER — Other Ambulatory Visit: Payer: Self-pay

## 2018-10-14 ENCOUNTER — Ambulatory Visit (INDEPENDENT_AMBULATORY_CARE_PROVIDER_SITE_OTHER): Payer: Medicare Other | Admitting: Endocrinology

## 2018-10-14 ENCOUNTER — Encounter: Payer: Self-pay | Admitting: Endocrinology

## 2018-10-14 VITALS — BP 122/70 | HR 66 | Ht 62.0 in | Wt 220.2 lb

## 2018-10-14 DIAGNOSIS — E1129 Type 2 diabetes mellitus with other diabetic kidney complication: Secondary | ICD-10-CM | POA: Diagnosis not present

## 2018-10-14 DIAGNOSIS — R809 Proteinuria, unspecified: Secondary | ICD-10-CM | POA: Diagnosis not present

## 2018-10-14 LAB — POCT GLYCOSYLATED HEMOGLOBIN (HGB A1C): Hemoglobin A1C: 9.2 % — AB (ref 4.0–5.6)

## 2018-10-14 MED ORDER — BROMOCRIPTINE MESYLATE 2.5 MG PO TABS: 1.2500 mg | ORAL_TABLET | Freq: Every day | ORAL | 11 refills | Status: DC

## 2018-10-14 NOTE — Patient Instructions (Addendum)
I have sent a prescription to your pharmacy, to add "bromocriptine." Please see Vaughan Basta, to learn about insulin. check your blood sugar once a day.  vary the time of day when you check, between before the 3 meals, and at bedtime.  also check if you have symptoms of your blood sugar being too high or too low.  please keep a record of the readings and bring it to your next appointment here (or you can bring the meter itself).  You can write it on any piece of paper.  please call us sooner if your blood sugar goes below 70, or if you have a lot of readings over 200. Please come back for a follow-up appointment in 2 months.

## 2018-10-14 NOTE — Progress Notes (Signed)
Subjective:    Patient ID: Krystal Watson, female    DOB: [birth year], 73 y.o.   MRN: [id number]  HPI Pt returns for f/u of diabetes mellitus:  DM type: 2 Dx'ed: 5643  Complications: nephropathy Therapy: 3 oral meds.  GDM: never.  DKA: never.  Severe hypoglycemia: never.  Pancreatitis: never.  Pancreatic imaging: normal on 2010 CT  Other: she declines weight loss surgery, at least for now: she did not tolerate pioglitazone (edema); she has never been on insulin; she declines jardiance, due to cost.   Interval history: No recent steroids.  She says cbg varies from 143-238.  pt states she feels well in general.  She seldom takes sudafed.  Past Medical History:  Diagnosis Date  . Allergy    sulfa  . Arthritis   . Cancer (HCC)    breast  . Chronic pain   . Diabetes mellitus   . Diverticulosis   . Gallstones   . GERD (gastroesophageal reflux disease)   . Hyperlipidemia   . Hypertension   . Obesity   . Shortness of breath    with exertion    Past Surgical History:  Procedure Laterality Date  . ABDOMINAL HYSTERECTOMY    . CHOLECYSTECTOMY    . COLONOSCOPY    . ERCP     Hx 2x  . SIMPLE MASTECTOMY WITH AXILLARY SENTINEL NODE BIOPSY  04/24/2012   Procedure: SIMPLE MASTECTOMY WITH AXILLARY SENTINEL NODE BIOPSY;  Surgeon: Haywood Lasso, MD;  Location: [place];  Service: General;  Laterality: Bilateral;  Bilateral total mastectomy and bilateral sentinel node    Social History   Socioeconomic History  . Marital status: Married    Spouse name: Not on file  . Number of children: 2  . Years of education: Not on file  . Highest education level: Not on file  Occupational History  . Occupation: Pharmacist, hospital  Social Needs  . Financial resource strain: Not on file  . Food insecurity    Worry: Not on file    Inability: Not on file  . Transportation needs    Medical: Not on file    Non-medical: Not on file  Tobacco Use  . Smoking status: Never Smoker  . Smokeless tobacco:  Never Used  Substance and Sexual Activity  . Alcohol use: No    Comment: rare  . Drug use: No  . Sexual activity: Yes  Lifestyle  . Physical activity    Days per week: Not on file    Minutes per session: Not on file  . Stress: Not on file  Relationships  . Social Herbalist on phone: Not on file    Gets together: Not on file    Attends religious service: Not on file    Active member of club or organization: Not on file    Attends meetings of clubs or organizations: Not on file    Relationship status: Not on file  . Intimate partner violence    Fear of current or ex partner: Not on file    Emotionally abused: Not on file    Physically abused: Not on file    Forced sexual activity: Not on file  Other Topics Concern  . Not on file  Social History Narrative  . Not on file    Current Outpatient Medications on File Prior to Visit  Medication Sig Dispense Refill  . Alum Hydroxide-Mag Carbonate (GAVISCON PO) Take 2 tablets by mouth daily as needed. For stomach    .  aspirin 81 MG tablet Take 81 mg by mouth daily.    [name] atorvastatin (LIPITOR) 40 MG tablet Take 40 mg by mouth daily.    . Calcium Carbonate-Vitamin D (CALTRATE 600+D) 600-400 MG-UNIT tablet Take 1 tablet by mouth 2 (two) times daily. (Patient taking differently: Take 1 tablet by mouth daily. )    . diltiazem (DILACOR XR) 240 MG 24 hr capsule Take 240 mg by mouth daily.    [name] esomeprazole (NEXIUM) 20 MG capsule Take 1 capsule (20 mg total) by mouth daily at 12 noon.    [name] glimepiride (AMARYL) 4 MG tablet Take 1 tablet (4 mg total) by mouth daily with breakfast. 90 tablet 3  . hydrochlorothiazide (HYDRODIURIL) 25 MG tablet Take 25 mg by mouth daily.    [name] ibandronate (BONIVA) 150 MG tablet Take 150 mg by mouth every 30 (thirty) days. Take in the morning with a full glass of water, on an empty stomach, and do not take anything else by mouth or lie down for the next 30 min.    . metFORMIN (GLUCOPHAGE) 500 MG tablet Take  1 tablet (500 mg total) by mouth 2 (two) times daily with a meal. 180 tablet 3  . Multiple Vitamins-Minerals (ONE-A-DAY WEIGHT SMART ADVANCE) TABS Take 1 tablet by mouth daily.    . phenylephrine (SUDAFED PE) 10 MG TABS Take 10 mg by mouth every 4 (four) hours as needed. For allergies    . PROAIR HFA 108 (90 BASE) MCG/ACT inhaler inhale 2 puffs by mouth every 6 hours for 10 days  0  . ramipril (ALTACE) 10 MG tablet Take 10 mg by mouth daily.    . sitaGLIPtin (JANUVIA) 100 MG tablet Take 1 tablet (100 mg total) by mouth daily. 90 tablet 3  . UNABLE TO FIND Rx: L8000-Post Surgical Bras (Quantity: 6) [id number]- Silicone Breast Prosthesis (Quantity: 2) Dx: 174.9; bilateral mastectomies 1 each 0   No current facility-administered medications on file prior to visit.     Allergies  Allergen Reactions  . Sulfonamide Derivatives     Pt can not remember the reaction    Family History  Problem Relation Age of Onset  . Colon cancer Father 42  . Diabetes Mother   . Heart disease Mother   . Breast cancer Sister   . Esophageal cancer Neg Hx   . Stomach cancer Neg Hx   . Rectal cancer Neg Hx     BP 122/70 (BP Location: Left Arm, Patient Position: Sitting, Cuff Size: Large)   Pulse 66   Ht 5\' 2"  (1.575 m)   Wt 220 lb 3.2 oz (99.9 kg)   SpO2 93%   BMI 40.28 kg/m    Review of Systems She denies hypoglycemia.  She has lost 10 lbs since last ov here.      Objective:   Physical Exam VITAL SIGNS:  See vs page GENERAL: no distress Pulses: dorsalis pedis intact bilat.   MSK: no deformity of the feet CV: 1+ bilat leg edema Skin:  no ulcer on the feet.  normal color and temp on the feet. Neuro: sensation is intact to touch on the feet  Lab Results  Component Value Date   CREATININE 0.68 09/23/2014   BUN 11 09/23/2014   NA 140 09/23/2014   K 3.5 09/23/2014   CL 97 09/23/2014   CO2 33 (H) 09/23/2014     Lab Results  Component Value Date   HGBA1C 9.2 (A) 10/14/2018       Assessment  &  Plan:  Type 2 DM, with DN: worse Allergic rhinitis.  There is an interaction between bromocriptine and sudafed, but we can use bromocriptine at a low dosage. Edema: This limits rx options.   Patient Instructions  I have sent a prescription to your pharmacy, to add "bromocriptine." Please see Vaughan Basta, to learn about insulin. check your blood sugar once a day.  vary the time of day when you check, between before the 3 meals, and at bedtime.  also check if you have symptoms of your blood sugar being too high or too low.  please keep a record of the readings and bring it to your next appointment here (or you can bring the meter itself).  You can write it on any piece of paper.  please call us sooner if your blood sugar goes below 70, or if you have a lot of readings over 200. Please come back for a follow-up appointment in 2 months.

## 2018-10-31 ENCOUNTER — Inpatient Hospital Stay: Payer: Medicare Other | Attending: Adult Health | Admitting: Adult Health

## 2018-10-31 ENCOUNTER — Telehealth: Payer: Self-pay | Admitting: Adult Health

## 2018-10-31 ENCOUNTER — Other Ambulatory Visit: Payer: Self-pay

## 2018-10-31 ENCOUNTER — Encounter: Payer: Self-pay | Admitting: Adult Health

## 2018-10-31 VITALS — BP 129/69 | HR 77 | Temp 98.5°F | Resp 18 | Ht 62.0 in | Wt 216.3 lb

## 2018-10-31 DIAGNOSIS — Z170 Estrogen receptor positive status [ER+]: Secondary | ICD-10-CM

## 2018-10-31 DIAGNOSIS — C50411 Malignant neoplasm of upper-outer quadrant of right female breast: Secondary | ICD-10-CM

## 2018-10-31 DIAGNOSIS — Z853 Personal history of malignant neoplasm of breast: Secondary | ICD-10-CM | POA: Insufficient documentation

## 2018-10-31 NOTE — Telephone Encounter (Signed)
I left a message regarding schedule  

## 2018-10-31 NOTE — Progress Notes (Signed)
CLINIC:  Survivorship   REASON FOR VISIT:  Routine follow-up for history of breast cancer.   BRIEF ONCOLOGIC HISTORY:  Oncology History  Breast cancer, right breast, UOQ, DCIS  03/12/2012 Initial Diagnosis   ductal carcinoma in situ with comedonecrosis and calcifications. Tumor was ER negative PR negative   04/24/2012 Surgery   Left Mastectomy: DCIS with comedonecrosis and calcifications. ER 32% PR negative; right mastectomy grade 3 multifocal DCIS 2.6, 1.5, and 10.0 cm. 1SLN neg, ER/PR Neg      INTERVAL HISTORY:  Krystal Watson presents to the [facility] today for routine follow-up for her history of breast cancer.  Overall, she reports feeling quite well.   She has had some issues with blood sugar elevation this past year.  She is working on her diet and has cut out sodas.  She is exercising by walking.  She says she struggles with limiting carbohydrates at times.  She is delighted however because she has lost 20 pounds.  Luis continues to f/u with her PCP regularly.  She notes that she is up to date with her cancer screenings.  She denies any other new issues today.     REVIEW OF SYSTEMS:  Review of Systems  Constitutional: Negative for appetite change, fatigue, fever and unexpected weight change.  HENT:   Negative for hearing loss, lump/mass, sore throat and trouble swallowing.   Eyes: Negative for eye problems and icterus.  Respiratory: Negative for chest tightness, cough and shortness of breath.   Cardiovascular: Negative for chest pain, leg swelling and palpitations.  Gastrointestinal: Negative for abdominal distention, abdominal pain, constipation, diarrhea, nausea and vomiting.  Endocrine: Negative for hot flashes.  Skin: Negative for itching and rash.  Neurological: Negative for dizziness, extremity weakness and numbness.  Hematological: Negative for adenopathy. Does not bruise/bleed easily.  Psychiatric/Behavioral: Negative for depression. The patient is not  nervous/anxious.   Breast: Denies any new nodularity, masses, tenderness, nipple changes, or nipple discharge.       PAST MEDICAL/SURGICAL HISTORY:  Past Medical History:  Diagnosis Date  . Allergy    sulfa  . Arthritis   . Cancer (HCC)    breast  . Chronic pain   . Diabetes mellitus   . Diverticulosis   . Gallstones   . GERD (gastroesophageal reflux disease)   . Hyperlipidemia   . Hypertension   . Obesity   . Shortness of breath    with exertion   Past Surgical History:  Procedure Laterality Date  . ABDOMINAL HYSTERECTOMY    . CHOLECYSTECTOMY    . COLONOSCOPY    . ERCP     Hx 2x  . SIMPLE MASTECTOMY WITH AXILLARY SENTINEL NODE BIOPSY  04/24/2012   Procedure: SIMPLE MASTECTOMY WITH AXILLARY SENTINEL NODE BIOPSY;  Surgeon: Haywood Lasso, MD;  Location: [facility];  Service: General;  Laterality: Bilateral;  Bilateral total mastectomy and bilateral sentinel node     ALLERGIES:  Allergies  Allergen Reactions  . Sulfonamide Derivatives     Pt can not remember the reaction     CURRENT MEDICATIONS:  Outpatient Encounter Medications as of 10/31/2018  Medication Sig Note  . Alum Hydroxide-Mag Carbonate (GAVISCON PO) Take 2 tablets by mouth daily as needed. For stomach   . aspirin 81 MG tablet Take 81 mg by mouth daily.   [name] atorvastatin (LIPITOR) 40 MG tablet Take 40 mg by mouth daily.   . bromocriptine (PARLODEL) 2.5 MG tablet Take 0.5 tablets (1.25 mg total) by mouth at bedtime.   [name]  Calcium Carbonate-Vitamin D (CALTRATE 600+D) 600-400 MG-UNIT tablet Take 1 tablet by mouth 2 (two) times daily. (Patient taking differently: Take 1 tablet by mouth daily. )   . diltiazem (DILACOR XR) 240 MG 24 hr capsule Take 240 mg by mouth daily.   [name] esomeprazole (NEXIUM) 20 MG capsule Take 1 capsule (20 mg total) by mouth daily at 12 noon.   [name] glimepiride (AMARYL) 4 MG tablet Take 1 tablet (4 mg total) by mouth daily with breakfast.   . hydrochlorothiazide (HYDRODIURIL) 25 MG tablet Take  25 mg by mouth daily.   [name] ibandronate (BONIVA) 150 MG tablet Take 150 mg by mouth every 30 (thirty) days. Take in the morning with a full glass of water, on an empty stomach, and do not take anything else by mouth or lie down for the next 30 min.   . metFORMIN (GLUCOPHAGE) 500 MG tablet Take 1 tablet (500 mg total) by mouth 2 (two) times daily with a meal.   . Multiple Vitamins-Minerals (ONE-A-DAY WEIGHT SMART ADVANCE) TABS Take 1 tablet by mouth daily.   . phenylephrine (SUDAFED PE) 10 MG TABS Take 10 mg by mouth every 4 (four) hours as needed. For allergies   . PROAIR HFA 108 (90 BASE) MCG/ACT inhaler inhale 2 puffs by mouth every 6 hours for 10 days 09/23/2014: Received from: External Pharmacy  . ramipril (ALTACE) 10 MG tablet Take 10 mg by mouth daily.   . sitaGLIPtin (JANUVIA) 100 MG tablet Take 1 tablet (100 mg total) by mouth daily.   [name] UNABLE TO FIND Rx: L8000-Post Surgical Bras (Quantity: 6) W1027- Silicone Breast Prosthesis (Quantity: 2) Dx: 174.9; bilateral mastectomies    No facility-administered encounter medications on file as of 10/31/2018.      ONCOLOGIC FAMILY HISTORY:  Family History  Problem Relation Age of Onset  . Colon cancer Father 61  . Diabetes Mother   . Heart disease Mother   . Breast cancer Sister   . Esophageal cancer Neg Hx   . Stomach cancer Neg Hx   . Rectal cancer Neg Hx      SOCIAL HISTORY:  Social History   Socioeconomic History  . Marital status: Married    Spouse name: Not on file  . Number of children: 2  . Years of education: Not on file  . Highest education level: Not on file  Occupational History  . Occupation: Pharmacist, hospital  Social Needs  . Financial resource strain: Not on file  . Food insecurity    Worry: Not on file    Inability: Not on file  . Transportation needs    Medical: Not on file    Non-medical: Not on file  Tobacco Use  . Smoking status: Never Smoker  . Smokeless tobacco: Never Used  Substance and Sexual Activity  .  Alcohol use: No    Comment: rare  . Drug use: No  . Sexual activity: Yes  Lifestyle  . Physical activity    Days per week: Not on file    Minutes per session: Not on file  . Stress: Not on file  Relationships  . Social Herbalist on phone: Not on file    Gets together: Not on file    Attends religious service: Not on file    Active member of club or organization: Not on file    Attends meetings of clubs or organizations: Not on file    Relationship status: Not on file  . Intimate partner violence  Fear of current or ex partner: Not on file    Emotionally abused: Not on file    Physically abused: Not on file    Forced sexual activity: Not on file  Other Topics Concern  . Not on file  Social History Narrative  . Not on file      PHYSICAL EXAMINATION:  Vital Signs: Vitals:   10/31/18 1113  BP: 129/69  Pulse: 77  Resp: 18  Temp: 98.5 F (36.9 C)  SpO2: 99%   Filed Weights   10/31/18 1113  Weight: 216 lb 4.8 oz (98.1 kg)   General: Well-nourished, well-appearing female in no acute distress.  Unaccompanied today.   HEENT: Head is normocephalic.  Pupils equal and reactive to light. Conjunctivae clear without exudate.  Sclerae anicteric. Oral mucosa is pink, moist.  Oropharynx is pink without lesions or erythema.  Lymph: No cervical, supraclavicular, or infraclavicular lymphadenopathy noted on palpation.  Cardiovascular: Regular rate and rhythm.[name] Respiratory: Clear to auscultation bilaterally. Chest expansion symmetric; breathing non-labored.  Breast Exam:  -Bilateral breasts are surgically absent, no nodules, masses, skin thickening/tenderness or sign of recurrence present. -Axilla: No axillary adenopathy bilaterally.  GI: Abdomen soft and round; non-tender, non-distended. Bowel sounds normoactive. No hepatosplenomegaly.   GU: Deferred.  Neuro: No focal deficits. Steady gait.  Psych: Mood and affect normal and appropriate for situation.  MSK: No focal  spinal tenderness to palpation, full range of motion in bilateral upper extremities Extremities: No edema. Skin: Warm and dry.  LABORATORY DATA:  None for this visit   DIAGNOSTIC IMAGING:  Most recent mammogram: n/a s/p bilateral mastectomies    ASSESSMENT AND PLAN:  Ms.. Machamer is a pleasant 73 y.o. female with history of Stage 0 bilateral breast DCIS, ER-/PR-, diagnosed in 04/2012, treated with bilateral mastectomies.  She [facility] for surveillance and routine follow-up.   1. History of breast cancer:  Krystal Watson is currently clinically and radiographically without evidence of disease or recurrence of breast cancer. I offered her graduation today, however she would like to continue to return here for her annual breast exam, which I am happy to accommodate.  I encouraged her to call me with any questions or concerns before her next visit at the cancer center, and I would be happy to see her sooner, if needed.    2. Bone health:  Given Krystal Watson's age, history of breast cancer, she is at risk for bone demineralization. She will f/u with her PCP about this. She was given education on specific food and activities to promote bone health.  3. Cancer screening:  Due to Krystal Watson's history and her age, she should receive screening for skin cancers, colon cancer. She was encouraged to follow-up with her PCP for appropriate cancer screenings.   4. Health maintenance and wellness promotion: Krystal Watson was encouraged to consume 5-7 servings of fruits and vegetables per day. We reviewed different foods to focus on and limit, including continuing to limit her carbohydrates, desserts and sugary beverages, and focusing on lean meats such as [place], chicken, and fish.  She was also encouraged to engage in moderate to vigorous exercise for 30 minutes per day most days of the week. I congratulated on her weight loss progress and encouraged her to keep going.  She was instructed to  limit her alcohol consumption and continue to abstain from tobacco use.    Dispo:  -Return to cancer center in one year for LTS follow up   A total  of (20) minutes of face-to-face time was spent with this patient with greater than 50% of that time in counseling and care-coordination.   Gardenia Phlegm, NP Survivorship Program [facility] [phone]   Note: PRIMARY CARE PROVIDER [place], [place] [phone]

## 2018-12-13 ENCOUNTER — Other Ambulatory Visit: Payer: Self-pay

## 2018-12-17 ENCOUNTER — Ambulatory Visit (INDEPENDENT_AMBULATORY_CARE_PROVIDER_SITE_OTHER): Payer: Medicare Other | Admitting: Endocrinology

## 2018-12-17 ENCOUNTER — Encounter: Payer: Self-pay | Admitting: Endocrinology

## 2018-12-17 ENCOUNTER — Other Ambulatory Visit: Payer: Self-pay

## 2018-12-17 VITALS — BP 126/70 | HR 70 | Ht 62.0 in | Wt 216.2 lb

## 2018-12-17 DIAGNOSIS — E1129 Type 2 diabetes mellitus with other diabetic kidney complication: Secondary | ICD-10-CM

## 2018-12-17 DIAGNOSIS — R809 Proteinuria, unspecified: Secondary | ICD-10-CM

## 2018-12-17 LAB — POCT GLYCOSYLATED HEMOGLOBIN (HGB A1C): Hemoglobin A1C: 8.6 % — AB (ref 4.0–5.6)

## 2018-12-17 MED ORDER — SAXAGLIPTIN HCL 5 MG PO TABS: 5.0000 mg | ORAL_TABLET | Freq: Every day | ORAL | 11 refills | Status: DC

## 2018-12-17 NOTE — Patient Instructions (Addendum)
I have sent a prescription to your pharmacy, to add "Onglyza." If this is expensive, please ask what medication in the same category is cheaper Please continue the same other diabetes medications. Our goals are a better a1c, and to phase out the glimepiride, if we can.  check your blood sugar once a day.  vary the time of day when you check, between before the 3 meals, and at bedtime.  also check if you have symptoms of your blood sugar being too high or too low.  please keep a record of the readings and bring it to your next appointment here (or you can bring the meter itself).  You can write it on any piece of paper.  please call us sooner if your blood sugar goes below 70, or if you have a lot of readings over 200. Please come back for a follow-up appointment in 2 months.

## 2018-12-17 NOTE — Progress Notes (Signed)
Subjective:    Patient ID: Krystal Watson, female    DOB: 13-Nov-1945, 73 y.o.   MRN: [id number]  HPI Pt returns for f/u of diabetes mellitus:  DM type: 2 Dx'ed: [id number]  Complications: nephropathy Therapy: 4 oral meds.  GDM: never.  DKA: never.  Severe hypoglycemia: never.  Pancreatitis: never.  Pancreatic imaging: normal on 2010 CT  Other: she declines weight loss surgery, at least for now: she did not tolerate pioglitazone (edema); she has never been on insulin; she declined [place], due to cost.  Interval history: No recent steroids.  She says cbg varies from 127-289.  pt states she feels well in general.     Past Medical History:  Diagnosis Date  . Allergy    sulfa  . Arthritis   . Cancer (HCC)    breast  . Chronic pain   . Diabetes mellitus   . Diverticulosis   . Gallstones   . GERD (gastroesophageal reflux disease)   . Hyperlipidemia   . Hypertension   . Obesity   . Shortness of breath    with exertion    Past Surgical History:  Procedure Laterality Date  . ABDOMINAL HYSTERECTOMY    . CHOLECYSTECTOMY    . COLONOSCOPY    . ERCP     Hx 2x  . SIMPLE MASTECTOMY WITH AXILLARY SENTINEL NODE BIOPSY  04/24/2012   Procedure: SIMPLE MASTECTOMY WITH AXILLARY SENTINEL NODE BIOPSY;  Surgeon: Haywood Lasso, MD;  Location: [facility];  Service: General;  Laterality: Bilateral;  Bilateral total mastectomy and bilateral sentinel node    Social History   Socioeconomic History  . Marital status: Married    Spouse name: Not on file  . Number of children: 2  . Years of education: Not on file  . Highest education level: Not on file  Occupational History  . Occupation: Pharmacist, hospital  Social Needs  . Financial resource strain: Not on file  . Food insecurity    Worry: Not on file    Inability: Not on file  . Transportation needs    Medical: Not on file    Non-medical: Not on file  Tobacco Use  . Smoking status: Never Smoker  . Smokeless tobacco: Never Used  Substance and  Sexual Activity  . Alcohol use: No    Comment: rare  . Drug use: No  . Sexual activity: Yes  Lifestyle  . Physical activity    Days per week: Not on file    Minutes per session: Not on file  . Stress: Not on file  Relationships  . Social Herbalist on phone: Not on file    Gets together: Not on file    Attends religious service: Not on file    Active member of club or organization: Not on file    Attends meetings of clubs or organizations: Not on file    Relationship status: Not on file  . Intimate partner violence    Fear of current or ex partner: Not on file    Emotionally abused: Not on file    Physically abused: Not on file    Forced sexual activity: Not on file  Other Topics Concern  . Not on file  Social History Narrative  . Not on file    Current Outpatient Medications on File Prior to Visit  Medication Sig Dispense Refill  . Alum Hydroxide-Mag Carbonate (GAVISCON PO) Take 2 tablets by mouth daily as needed. For stomach    .  aspirin 81 MG tablet Take 81 mg by mouth daily.    [name] atorvastatin (LIPITOR) 40 MG tablet Take 40 mg by mouth daily.    . bromocriptine (PARLODEL) 2.5 MG tablet Take 0.5 tablets (1.25 mg total) by mouth at bedtime. 15 tablet 11  . Calcium Carbonate-Vitamin D (CALTRATE 600+D) 600-400 MG-UNIT tablet Take 1 tablet by mouth 2 (two) times daily. (Patient taking differently: Take 1 tablet by mouth daily. )    . diltiazem (DILACOR XR) 240 MG 24 hr capsule Take 240 mg by mouth daily.    . empagliflozin (JARDIANCE) 25 MG TABS tablet Take 25 mg by mouth daily.    [name] esomeprazole (NEXIUM) 20 MG capsule Take 1 capsule (20 mg total) by mouth daily at 12 noon.    [name] glimepiride (AMARYL) 4 MG tablet Take 1 tablet (4 mg total) by mouth daily with breakfast. 90 tablet 3  . hydrochlorothiazide (HYDRODIURIL) 25 MG tablet Take 25 mg by mouth daily.    [name] ibandronate (BONIVA) 150 MG tablet Take 150 mg by mouth every 30 (thirty) days. Take in the morning with a  full glass of water, on an empty stomach, and do not take anything else by mouth or lie down for the next 30 min.    . metFORMIN (GLUCOPHAGE) 500 MG tablet Take 1 tablet (500 mg total) by mouth 2 (two) times daily with a meal. 180 tablet 3  . Multiple Vitamins-Minerals (ONE-A-DAY WEIGHT SMART ADVANCE) TABS Take 1 tablet by mouth daily.    . phenylephrine (SUDAFED PE) 10 MG TABS Take 10 mg by mouth every 4 (four) hours as needed. For allergies    . PROAIR HFA 108 (90 BASE) MCG/ACT inhaler inhale 2 puffs by mouth every 6 hours for 10 days  0  . ramipril (ALTACE) 10 MG tablet Take 10 mg by mouth daily.    [name] UNABLE TO FIND Rx: L8000-Post Surgical Bras (Quantity: 6) [id number]- Silicone Breast Prosthesis (Quantity: 2) Dx: 174.9; bilateral mastectomies 1 each 0   No current facility-administered medications on file prior to visit.     Allergies  Allergen Reactions  . Sulfonamide Derivatives     Pt can not remember the reaction    Family History  Problem Relation Age of Onset  . Colon cancer Father 5  . Diabetes Mother   . Heart disease Mother   . Breast cancer Sister   . Esophageal cancer Neg Hx   . Stomach cancer Neg Hx   . Rectal cancer Neg Hx     BP 126/70 (BP Location: Left Arm, Patient Position: Sitting, Cuff Size: Large)   Pulse 70   Ht 5\' 2"  (1.575 m)   Wt 216 lb 3.2 oz (98.1 kg)   SpO2 98%   BMI 39.54 kg/m    Review of Systems She denies hypoglycemia.  She has lost a few lbs, due to her efforts.     Objective:   Physical Exam VITAL SIGNS:  See vs page GENERAL: no distress Pulses: dorsalis pedis intact bilat.   MSK: no deformity of the feet CV: trace bilat leg edema Skin:  no ulcer on the feet.  normal color and temp on the feet.  Neuro: sensation is intact to touch on the feet.  Ext: there is bilateral onychomycosis of the toenails.    Lab Results  Component Value Date   CREATININE 0.68 09/23/2014   BUN 11 09/23/2014   NA 140 09/23/2014   K 3.5 09/23/2014   CL  97  09/23/2014   CO2 33 (H) 09/23/2014     Lab Results  Component Value Date   HGBA1C 8.6 (A) 12/17/2018        Assessment & Plan:  Insulin-requiring type 2 DM: she needs increased rx.  We discussed.  She agrees to try a brand name med, to see if she can afford it.   Edema: This limits rx options.    Patient Instructions  I have sent a prescription to your pharmacy, to add "Onglyza." If this is expensive, please ask what medication in the same category is cheaper Please continue the same other diabetes medications. Our goals are a better a1c, and to phase out the glimepiride, if we can.  check your blood sugar once a day.  vary the time of day when you check, between before the 3 meals, and at bedtime.  also check if you have symptoms of your blood sugar being too high or too low.  please keep a record of the readings and bring it to your next appointment here (or you can bring the meter itself).  You can write it on any piece of paper.  please call us sooner if your blood sugar goes below 70, or if you have a lot of readings over 200. Please come back for a follow-up appointment in 2 months.

## 2019-01-14 ENCOUNTER — Telehealth: Payer: Self-pay

## 2019-01-14 NOTE — Telephone Encounter (Signed)
Please advise 

## 2019-01-14 NOTE — Telephone Encounter (Signed)
patient called in stating she has developed an yeast infection from her taking (JARDIANCE) she said she's going to stop taking it all together. Wants to know if she can get something else. Also she's only had one blood sugar over 200.  Please advise

## 2019-01-14 NOTE — Telephone Encounter (Signed)
SECOND ATTEMPT: ° °LVM requesting returned call. °

## 2019-01-14 NOTE — Telephone Encounter (Signed)
1.  Do you want me to send rx for the yeast? 2.  Do you want to try reducing the jardiance, rather than stopping?

## 2019-01-14 NOTE — Telephone Encounter (Signed)
LVM requesting returned call 

## 2019-01-15 NOTE — Telephone Encounter (Signed)
FINAL ATTEMPT:  Again called pt to provide with Dr. Cordelia Pen options. LVM requesting returned call.

## 2019-01-20 NOTE — Telephone Encounter (Signed)
LVM requesting returned call 

## 2019-01-20 NOTE — Telephone Encounter (Signed)
Patient called back returning your VM

## 2019-01-27 MED ORDER — JARDIANCE 10 MG PO TABS: 10.0000 mg | ORAL_TABLET | Freq: Every day | ORAL | 3 refills | Status: DC

## 2019-01-27 NOTE — Telephone Encounter (Signed)
empagliflozin (JARDIANCE) 10 MG TABS tablet 90 tablet 3 01/27/2019    Sig - Route: Take 10 mg by mouth daily. - Oral   Sent to pharmacy as: empagliflozin (JARDIANCE) 10 MG Tab tablet   E-Prescribing Status: Receipt confirmed by pharmacy (01/27/2019 5:00 PM EDT)

## 2019-01-27 NOTE — Telephone Encounter (Signed)
Pt finally returned call. States since stopping the [place], she has not had a yeast infection since. Advised, at the time of her call, (13 days ago) Dr. Loanne Drilling offered Rx Diflucan. However, advised since we are just now hearing from her, this would not be beneficial since she has chosen to stop Jardiance all together. Therefore, offered ONLY other available option; to reduce Jardiance. Pt has agreed to this option. Advised I would send Dr. Loanne Drilling the message. Verbalized acceptance and understanding.

## 2019-01-27 NOTE — Addendum Note (Signed)
Addended by: Renato Shin on: 01/27/2019 04:59 PM   Modules accepted: Orders

## 2019-01-27 NOTE — Telephone Encounter (Signed)
Ok, I have sent a prescription to your pharmacy, to reduce the [place]

## 2019-02-14 ENCOUNTER — Other Ambulatory Visit: Payer: Self-pay

## 2019-02-18 ENCOUNTER — Ambulatory Visit (INDEPENDENT_AMBULATORY_CARE_PROVIDER_SITE_OTHER): Payer: Medicare Other | Admitting: Endocrinology

## 2019-02-18 ENCOUNTER — Other Ambulatory Visit: Payer: Self-pay

## 2019-02-18 ENCOUNTER — Encounter: Payer: Self-pay | Admitting: Endocrinology

## 2019-02-18 VITALS — BP 124/78 | HR 60 | Ht 62.0 in | Wt 209.6 lb

## 2019-02-18 DIAGNOSIS — R809 Proteinuria, unspecified: Secondary | ICD-10-CM

## 2019-02-18 DIAGNOSIS — E1129 Type 2 diabetes mellitus with other diabetic kidney complication: Secondary | ICD-10-CM | POA: Diagnosis not present

## 2019-02-18 LAB — POCT GLYCOSYLATED HEMOGLOBIN (HGB A1C): Hemoglobin A1C: 7.9 % — AB (ref 4.0–5.6)

## 2019-02-18 MED ORDER — BROMOCRIPTINE MESYLATE 2.5 MG PO TABS: 2.5000 mg | ORAL_TABLET | Freq: Every day | ORAL | 3 refills | Status: DC

## 2019-02-18 MED ORDER — REPAGLINIDE 2 MG PO TABS: 2.0000 mg | ORAL_TABLET | Freq: Three times a day (TID) | ORAL | 11 refills | Status: DC

## 2019-02-18 NOTE — Progress Notes (Signed)
Subjective:    Patient ID: Krystal Watson, female    DOB: May 04, 1945, 73 y.o.   MRN: [id number]  HPI Pt returns for f/u of diabetes mellitus:  DM type: 2 Dx'ed: [id number]  Complications: nephropathy Therapy: 4 oral meds.  GDM: never.  DKA: never.  Severe hypoglycemia: never.  Pancreatitis: never.  Pancreatic imaging: normal on 2010 CT  Other: she declines weight loss surgery: she did not tolerate pioglitazone (edema); she has never been on insulin; she declined [place], due to cost; she takes Jardiance just 5 mg qd, due to vaginitis. Interval history: No recent steroids.  She says cbg varies from 89-202.  pt states she feels well in general.  She did not take Onglyza, due to cost.     Past Medical History:  Diagnosis Date  . Allergy    sulfa  . Arthritis   . Cancer (HCC)    breast  . Chronic pain   . Diabetes mellitus   . Diverticulosis   . Gallstones   . GERD (gastroesophageal reflux disease)   . Hyperlipidemia   . Hypertension   . Obesity   . Shortness of breath    with exertion    Past Surgical History:  Procedure Laterality Date  . ABDOMINAL HYSTERECTOMY    . CHOLECYSTECTOMY    . COLONOSCOPY    . ERCP     Hx 2x  . SIMPLE MASTECTOMY WITH AXILLARY SENTINEL NODE BIOPSY  04/24/2012   Procedure: SIMPLE MASTECTOMY WITH AXILLARY SENTINEL NODE BIOPSY;  Surgeon: Haywood Lasso, MD;  Location: [place];  Service: General;  Laterality: Bilateral;  Bilateral total mastectomy and bilateral sentinel node    Social History   Socioeconomic History  . Marital status: Married    Spouse name: Not on file  . Number of children: 2  . Years of education: Not on file  . Highest education level: Not on file  Occupational History  . Occupation: Pharmacist, hospital  Social Needs  . Financial resource strain: Not on file  . Food insecurity    Worry: Not on file    Inability: Not on file  . Transportation needs    Medical: Not on file    Non-medical: Not on file  Tobacco Use  . Smoking  status: Never Smoker  . Smokeless tobacco: Never Used  Substance and Sexual Activity  . Alcohol use: No    Comment: rare  . Drug use: No  . Sexual activity: Yes  Lifestyle  . Physical activity    Days per week: Not on file    Minutes per session: Not on file  . Stress: Not on file  Relationships  . Social Herbalist on phone: Not on file    Gets together: Not on file    Attends religious service: Not on file    Active member of club or organization: Not on file    Attends meetings of clubs or organizations: Not on file    Relationship status: Not on file  . Intimate partner violence    Fear of current or ex partner: Not on file    Emotionally abused: Not on file    Physically abused: Not on file    Forced sexual activity: Not on file  Other Topics Concern  . Not on file  Social History Narrative  . Not on file    Current Outpatient Medications on File Prior to Visit  Medication Sig Dispense Refill  . Alum Hydroxide-Mag Carbonate (GAVISCON PO)  Take 2 tablets by mouth daily as needed. For stomach    . aspirin 81 MG tablet Take 81 mg by mouth daily.    [name] atorvastatin (LIPITOR) 40 MG tablet Take 40 mg by mouth daily.    . Calcium Carbonate-Vitamin D (CALTRATE 600+D) 600-400 MG-UNIT tablet Take 1 tablet by mouth 2 (two) times daily. (Patient taking differently: Take 1 tablet by mouth daily. )    . diltiazem (DILACOR XR) 240 MG 24 hr capsule Take 240 mg by mouth daily.    . empagliflozin (JARDIANCE) 10 MG TABS tablet Take 10 mg by mouth daily. (Patient taking differently: Take 5 mg by mouth daily. ) 90 tablet 3  . esomeprazole (NEXIUM) 20 MG capsule Take 1 capsule (20 mg total) by mouth daily at 12 noon.    . hydrochlorothiazide (HYDRODIURIL) 25 MG tablet Take 25 mg by mouth daily.    [name] ibandronate (BONIVA) 150 MG tablet Take 150 mg by mouth every 30 (thirty) days. Take in the morning with a full glass of water, on an empty stomach, and do not take anything else by mouth  or lie down for the next 30 min.    . metFORMIN (GLUCOPHAGE) 500 MG tablet Take 1 tablet (500 mg total) by mouth 2 (two) times daily with a meal. 180 tablet 3  . Multiple Vitamins-Minerals (ONE-A-DAY WEIGHT SMART ADVANCE) TABS Take 1 tablet by mouth daily.    . phenylephrine (SUDAFED PE) 10 MG TABS Take 10 mg by mouth every 4 (four) hours as needed. For allergies    . PROAIR HFA 108 (90 BASE) MCG/ACT inhaler inhale 2 puffs by mouth every 6 hours for 10 days  0  . ramipril (ALTACE) 10 MG tablet Take 10 mg by mouth daily.    [name] UNABLE TO FIND Rx: L8000-Post Surgical Bras (Quantity: 6) [id number]- Silicone Breast Prosthesis (Quantity: 2) Dx: 174.9; bilateral mastectomies 1 each 0   No current facility-administered medications on file prior to visit.     Allergies  Allergen Reactions  . Sulfonamide Derivatives     Pt can not remember the reaction    Family History  Problem Relation Age of Onset  . Colon cancer Father 51  . Diabetes Mother   . Heart disease Mother   . Breast cancer Sister   . Esophageal cancer Neg Hx   . Stomach cancer Neg Hx   . Rectal cancer Neg Hx     BP 124/78 (BP Location: Right Arm, Patient Position: Sitting, Cuff Size: Large)   Pulse 60   Ht 5\' 2"  (1.575 m)   Wt 209 lb 9.6 oz (95.1 kg)   SpO2 98%   BMI 38.34 kg/m    Review of Systems She denies hypoglycemia/nausea/dizziness.     Objective:   Physical Exam VITAL SIGNS:  See vs page GENERAL: no distress Pulses: dorsalis pedis intact bilat.   MSK: no deformity of the feet CV: trace bilat leg edema Skin:  no ulcer on the feet.  normal color and temp on the feet. Neuro: sensation is intact to touch on the feet.   Ext: left great toenail is absent  Lab Results  Component Value Date   HGBA1C 7.9 (A) 02/18/2019       Assessment & Plan:  Type 2 DM: she needs increased rx Allergic rhinitis, new to me: there is an interaction between bromocriptine and sudafed.   Edema: This limits rx options  Patient  Instructions  I have sent a prescription to your pharmacy,  to double the bromocriptine Please continue the same other diabetes medications. check your blood sugar once a day.  vary the time of day when you check, between before the 3 meals, and at bedtime.  also check if you have symptoms of your blood sugar being too high or too low.  please keep a record of the readings and bring it to your next appointment here (or you can bring the meter itself).  You can write it on any piece of paper.  please call us sooner if your blood sugar goes below 70, or if you have a lot of readings over 200. Please come back for a follow-up appointment in 2-3 months.

## 2019-02-18 NOTE — Patient Instructions (Addendum)
I have sent a prescription to your pharmacy, to double the bromocriptine Please continue the same other diabetes medications. check your blood sugar once a day.  vary the time of day when you check, between before the 3 meals, and at bedtime.  also check if you have symptoms of your blood sugar being too high or too low.  please keep a record of the readings and bring it to your next appointment here (or you can bring the meter itself).  You can write it on any piece of paper.  please call us sooner if your blood sugar goes below 70, or if you have a lot of readings over 200. Please come back for a follow-up appointment in 2-3 months.

## 2019-05-05 ENCOUNTER — Ambulatory Visit (INDEPENDENT_AMBULATORY_CARE_PROVIDER_SITE_OTHER): Payer: Medicare PPO | Admitting: Endocrinology

## 2019-05-05 ENCOUNTER — Other Ambulatory Visit: Payer: Self-pay

## 2019-05-05 ENCOUNTER — Encounter: Payer: Self-pay | Admitting: Endocrinology

## 2019-05-05 VITALS — BP 124/78 | HR 70 | Ht 62.0 in | Wt 207.4 lb

## 2019-05-05 DIAGNOSIS — R809 Proteinuria, unspecified: Secondary | ICD-10-CM

## 2019-05-05 DIAGNOSIS — E1129 Type 2 diabetes mellitus with other diabetic kidney complication: Secondary | ICD-10-CM

## 2019-05-05 LAB — POCT GLYCOSYLATED HEMOGLOBIN (HGB A1C): Hemoglobin A1C: 8.4 % — AB (ref 4.0–5.6)

## 2019-05-05 MED ORDER — BROMOCRIPTINE MESYLATE 5 MG PO CAPS: 5.0000 mg | ORAL_CAPSULE | Freq: Every day | ORAL | 3 refills | Status: DC

## 2019-05-05 NOTE — Progress Notes (Signed)
Subjective:    Patient ID: Krystal Watson, female    DOB: 1945-10-24, 74 y.o.   MRN: [id number]  HPI Pt returns for f/u of diabetes mellitus:  DM type: 2 Dx'ed: [id number]  Complications: nephropathy Therapy: 4 oral meds.  GDM: never.  DKA: never.  Severe hypoglycemia: never.  Pancreatitis: never.  Pancreatic imaging: normal on 2010 CT  Other: she declines weight loss surgery: she did not tolerate pioglitazone (edema); she has never been on insulin; she declined Januvia and Onglyza, due to cost; she takes Jardiance just 5 mg qd, due to vaginitis. Interval history: No recent steroids.  She says cbg varies from 126-203.  pt states she feels well in general.  She often forgets to take repaglinide.   Past Medical History:  Diagnosis Date  . Allergy    sulfa  . Arthritis   . Cancer (HCC)    breast  . Chronic pain   . Diabetes mellitus   . Diverticulosis   . Gallstones   . GERD (gastroesophageal reflux disease)   . Hyperlipidemia   . Hypertension   . Obesity   . Shortness of breath    with exertion    Past Surgical History:  Procedure Laterality Date  . ABDOMINAL HYSTERECTOMY    . CHOLECYSTECTOMY    . COLONOSCOPY    . ERCP     Hx 2x  . SIMPLE MASTECTOMY WITH AXILLARY SENTINEL NODE BIOPSY  04/24/2012   Procedure: SIMPLE MASTECTOMY WITH AXILLARY SENTINEL NODE BIOPSY;  Surgeon: Haywood Lasso, MD;  Location: [place];  Service: General;  Laterality: Bilateral;  Bilateral total mastectomy and bilateral sentinel node    Social History   Socioeconomic History  . Marital status: Married    Spouse name: Not on file  . Number of children: 2  . Years of education: Not on file  . Highest education level: Not on file  Occupational History  . Occupation: Pharmacist, hospital  Tobacco Use  . Smoking status: Never Smoker  . Smokeless tobacco: Never Used  Substance and Sexual Activity  . Alcohol use: No    Comment: rare  . Drug use: No  . Sexual activity: Yes  Other Topics Concern  . Not  on file  Social History Narrative  . Not on file   Social Determinants of Health   Financial Resource Strain:   . Difficulty of Paying Living Expenses: Not on file  Food Insecurity:   . Worried About Charity fundraiser in the Last Year: Not on file  . Ran Out of Food in the Last Year: Not on file  Transportation Needs:   . Lack of Transportation (Medical): Not on file  . Lack of Transportation (Non-Medical): Not on file  Physical Activity:   . Days of Exercise per Week: Not on file  . Minutes of Exercise per Session: Not on file  Stress:   . Feeling of Stress : Not on file  Social Connections:   . Frequency of Communication with Friends and Family: Not on file  . Frequency of Social Gatherings with Friends and Family: Not on file  . Attends Religious Services: Not on file  . Active Member of Clubs or Organizations: Not on file  . Attends Archivist Meetings: Not on file  . Marital Status: Not on file  Intimate Partner Violence:   . Fear of Current or Ex-Partner: Not on file  . Emotionally Abused: Not on file  . Physically Abused: Not on file  .  Sexually Abused: Not on file    Current Outpatient Medications on File Prior to Visit  Medication Sig Dispense Refill  . Alum Hydroxide-Mag Carbonate (GAVISCON PO) Take 2 tablets by mouth daily as needed. For stomach    . aspirin 81 MG tablet Take 81 mg by mouth daily.    [name] atorvastatin (LIPITOR) 40 MG tablet Take 40 mg by mouth daily.    . Calcium Carbonate-Vitamin D (CALTRATE 600+D) 600-400 MG-UNIT tablet Take 1 tablet by mouth 2 (two) times daily. (Patient taking differently: Take 1 tablet by mouth daily. )    . diltiazem (DILACOR XR) 240 MG 24 hr capsule Take 240 mg by mouth daily.    . empagliflozin (JARDIANCE) 10 MG TABS tablet Take 10 mg by mouth daily. (Patient taking differently: Take 5 mg by mouth daily. ) 90 tablet 3  . esomeprazole (NEXIUM) 20 MG capsule Take 1 capsule (20 mg total) by mouth daily at 12 noon.     . hydrochlorothiazide (HYDRODIURIL) 25 MG tablet Take 25 mg by mouth daily.    [name] ibandronate (BONIVA) 150 MG tablet Take 150 mg by mouth every 30 (thirty) days. Take in the morning with a full glass of water, on an empty stomach, and do not take anything else by mouth or lie down for the next 30 min.    . metFORMIN (GLUCOPHAGE) 500 MG tablet Take 1 tablet (500 mg total) by mouth 2 (two) times daily with a meal. 180 tablet 3  . Multiple Vitamins-Minerals (ONE-A-DAY WEIGHT SMART ADVANCE) TABS Take 1 tablet by mouth daily.    . phenylephrine (SUDAFED PE) 10 MG TABS Take 10 mg by mouth every 4 (four) hours as needed. For allergies    . PROAIR HFA 108 (90 BASE) MCG/ACT inhaler inhale 2 puffs by mouth every 6 hours for 10 days  0  . ramipril (ALTACE) 10 MG tablet Take 10 mg by mouth daily.    . repaglinide (PRANDIN) 2 MG tablet Take 1 tablet (2 mg total) by mouth 3 (three) times daily before meals. 90 tablet 11  . UNABLE TO FIND Rx: L8000-Post Surgical Bras (Quantity: 6) [id number]- Silicone Breast Prosthesis (Quantity: 2) Dx: 174.9; bilateral mastectomies 1 each 0   No current facility-administered medications on file prior to visit.    Allergies  Allergen Reactions  . Sulfonamide Derivatives     Pt can not remember the reaction    Family History  Problem Relation Age of Onset  . Colon cancer Father 26  . Diabetes Mother   . Heart disease Mother   . Breast cancer Sister   . Esophageal cancer Neg Hx   . Stomach cancer Neg Hx   . Rectal cancer Neg Hx     BP 124/78 (BP Location: Left Arm, Patient Position: Sitting, Cuff Size: Large)   Pulse 70   Ht 5\' 2"  (1.575 m)   Wt 207 lb 6.4 oz (94.1 kg)   SpO2 98%   BMI 37.93 kg/m   Review of Systems She denies hypoglycemia    Objective:   Physical Exam VITAL SIGNS:  See vs page GENERAL: no distress Pulses: dorsalis pedis intact bilat.   MSK: no deformity of the feet CV: trace bilat leg edema Skin:  no ulcer on the feet.  normal color and  temp on the feet. Neuro: sensation is intact to touch on the feet  Lab Results  Component Value Date   HGBA1C 8.4 (A) 05/05/2019   Lab Results  Component Value Date  CREATININE 0.68 09/23/2014   BUN 11 09/23/2014   NA 140 09/23/2014   K 3.5 09/23/2014   CL 97 09/23/2014   CO2 33 (H) 09/23/2014      Assessment & Plan:  Worse.  She declines to add another medication Noncompliance with repaglinide.  I told pt that glimepiride is qd, but not advisable at pt's age Edema: This limits rx options.  Patient Instructions  I have sent a prescription to your pharmacy, to double the bromocriptine again Please continue the same other diabetes medications. To help you remember the repaglinide, put some ina key chain holder.  check your blood sugar once a day.  vary the time of day when you check, between before the 3 meals, and at bedtime.  also check if you have symptoms of your blood sugar being too high or too low.  please keep a record of the readings and bring it to your next appointment here (or you can bring the meter itself).  You can write it on any piece of paper.  please call us sooner if your blood sugar goes below 70, or if you have a lot of readings over 200. Please come back for a follow-up appointment in 2 months.

## 2019-05-05 NOTE — Patient Instructions (Addendum)
I have sent a prescription to your pharmacy, to double the bromocriptine again Please continue the same other diabetes medications. To help you remember the repaglinide, put some ina key chain holder.  check your blood sugar once a day.  vary the time of day when you check, between before the 3 meals, and at bedtime.  also check if you have symptoms of your blood sugar being too high or too low.  please keep a record of the readings and bring it to your next appointment here (or you can bring the meter itself).  You can write it on any piece of paper.  please call us sooner if your blood sugar goes below 70, or if you have a lot of readings over 200. Please come back for a follow-up appointment in 2 months.

## 2019-05-26 ENCOUNTER — Other Ambulatory Visit: Payer: Self-pay | Admitting: Endocrinology

## 2019-06-24 ENCOUNTER — Other Ambulatory Visit: Payer: Self-pay

## 2019-06-24 ENCOUNTER — Telehealth: Payer: Self-pay | Admitting: Endocrinology

## 2019-06-24 DIAGNOSIS — R809 Proteinuria, unspecified: Secondary | ICD-10-CM

## 2019-06-24 DIAGNOSIS — E1129 Type 2 diabetes mellitus with other diabetic kidney complication: Secondary | ICD-10-CM

## 2019-06-24 MED ORDER — ONETOUCH VERIO VI STRP: 1.0000 | ORAL_STRIP | Freq: Every day | 0 refills | Status: DC

## 2019-06-24 MED ORDER — ONETOUCH DELICA LANCETS 33G MISC: 1.0000 | Freq: Every day | 0 refills | Status: DC

## 2019-06-24 NOTE — Telephone Encounter (Signed)
E-Prescribing Status: Receipt confirmed by pharmacy (06/24/2019  4:05 PM EDT)

## 2019-06-24 NOTE — Telephone Encounter (Signed)
MEDICATION: One Test Strips and Lancets  PHARMACY:  [facility] on [address] A 90 DAY SUPPLY : yes  IS PATIENT OUT OF MEDICATION: yes  IF NOT; HOW MUCH IS LEFT:   LAST APPOINTMENT DATE: @2 /22/2021  NEXT APPOINTMENT DATE:@4 /08/2019  DO WE HAVE YOUR PERMISSION TO LEAVE A DETAILED MESSAGE: yes [phone]  OTHER COMMENTS:    **Let patient know to contact pharmacy at the end of the day to make sure medication is ready. **  ** Please notify patient to allow 48-72 hours to process**  **Encourage patient to contact the pharmacy for refills or they can request refills through [facility]**

## 2019-07-07 ENCOUNTER — Ambulatory Visit (INDEPENDENT_AMBULATORY_CARE_PROVIDER_SITE_OTHER): Payer: Medicare PPO | Admitting: Endocrinology

## 2019-07-07 ENCOUNTER — Encounter: Payer: Self-pay | Admitting: Endocrinology

## 2019-07-07 ENCOUNTER — Other Ambulatory Visit: Payer: Self-pay

## 2019-07-07 VITALS — BP 120/80 | HR 55 | Ht 62.0 in | Wt 206.0 lb

## 2019-07-07 DIAGNOSIS — E1129 Type 2 diabetes mellitus with other diabetic kidney complication: Secondary | ICD-10-CM

## 2019-07-07 DIAGNOSIS — R809 Proteinuria, unspecified: Secondary | ICD-10-CM | POA: Diagnosis not present

## 2019-07-07 LAB — POCT GLYCOSYLATED HEMOGLOBIN (HGB A1C): Hemoglobin A1C: 8.3 % — AB (ref 4.0–5.6)

## 2019-07-07 MED ORDER — RYBELSUS 3 MG PO TABS: 3.0000 mg | ORAL_TABLET | Freq: Every day | ORAL | 11 refills | Status: DC

## 2019-07-07 MED ORDER — ACCU-CHEK GUIDE VI STRP: 1.0000 | ORAL_STRIP | Freq: Every day | 0 refills | Status: DC

## 2019-07-07 MED ORDER — ACCU-CHEK GUIDE W/DEVICE KIT: 1.0000 | PACK | Freq: Every day | 0 refills | Status: DC

## 2019-07-07 MED ORDER — ACCU-CHEK SOFTCLIX LANCETS MISC: 1.0000 | Freq: Every day | 0 refills | Status: DC

## 2019-07-07 NOTE — Patient Instructions (Addendum)
Here is the phone number to call to get strips.   I have sent a prescription to your pharmacy, to add "Rybelsus." Please continue the same other medications check your blood sugar once a day.  vary the time of day when you check, between before the 3 meals, and at bedtime.  also check if you have symptoms of your blood sugar being too high or too low.  please keep a record of the readings and bring it to your next appointment here (or you can bring the meter itself).  You can write it on any piece of paper.  please call us sooner if your blood sugar goes below 70, or if you have a lot of readings over 200. Please come back for a follow-up appointment in 2 months.

## 2019-07-07 NOTE — Progress Notes (Signed)
Subjective:    Patient ID: Krystal Watson, female    DOB: [birth year], 74 y.o.   MRN: [id number]  HPI Pt returns for f/u of diabetes mellitus:  DM type: 2 Dx'ed: [id number]  Complications: nephropathy Therapy: 4 oral meds.  GDM: never.  DKA: never.  Severe hypoglycemia: never.  Pancreatitis: never.  Pancreatic imaging: normal on 2010 CT  Other: she declines weight loss surgery: she did not tolerate pioglitazone (edema); she has never been on insulin; she declined Januvia and Onglyza, due to cost; she takes Jardiance just 5 mg qd, due to vaginitis.   Interval history: No recent steroids.  Pt says she needs a form to get strips.  pt states she feels well in general.  She takes meds as rx'ed.   Past Medical History:  Diagnosis Date  . Allergy    sulfa  . Arthritis   . Cancer (HCC)    breast  . Chronic pain   . Diabetes mellitus   . Diverticulosis   . Gallstones   . GERD (gastroesophageal reflux disease)   . Hyperlipidemia   . Hypertension   . Obesity   . Shortness of breath    with exertion    Past Surgical History:  Procedure Laterality Date  . ABDOMINAL HYSTERECTOMY    . CHOLECYSTECTOMY    . COLONOSCOPY    . ERCP     Hx 2x  . SIMPLE MASTECTOMY WITH AXILLARY SENTINEL NODE BIOPSY  04/24/2012   Procedure: SIMPLE MASTECTOMY WITH AXILLARY SENTINEL NODE BIOPSY;  Surgeon: Haywood Lasso, MD;  Location: [place];  Service: General;  Laterality: Bilateral;  Bilateral total mastectomy and bilateral sentinel node    Social History   Socioeconomic History  . Marital status: Married    Spouse name: Not on file  . Number of children: 2  . Years of education: Not on file  . Highest education level: Not on file  Occupational History  . Occupation: Pharmacist, hospital  Tobacco Use  . Smoking status: Never Smoker  . Smokeless tobacco: Never Used  Substance and Sexual Activity  . Alcohol use: No    Comment: rare  . Drug use: No  . Sexual activity: Yes  Other Topics Concern  . Not on file   Social History Narrative  . Not on file   Social Determinants of Health   Financial Resource Strain:   . Difficulty of Paying Living Expenses:   Food Insecurity:   . Worried About Charity fundraiser in the Last Year:   . Arboriculturist in the Last Year:   Transportation Needs:   . Film/video editor (Medical):   [name] Lack of Transportation (Non-Medical):   Physical Activity:   . Days of Exercise per Week:   . Minutes of Exercise per Session:   Stress:   . Feeling of Stress :   Social Connections:   . Frequency of Communication with Friends and Family:   . Frequency of Social Gatherings with Friends and Family:   . Attends Religious Services:   . Active Member of Clubs or Organizations:   . Attends Archivist Meetings:   [name] Marital Status:   Intimate Partner Violence:   . Fear of Current or Ex-Partner:   . Emotionally Abused:   [name] Physically Abused:   . Sexually Abused:     Current Outpatient Medications on File Prior to Visit  Medication Sig Dispense Refill  . Alum Hydroxide-Mag Carbonate (GAVISCON PO) Take 2 tablets by  mouth daily as needed. For stomach    . aspirin 81 MG tablet Take 81 mg by mouth daily.    [name] atorvastatin (LIPITOR) 40 MG tablet Take 40 mg by mouth daily.    . bromocriptine (PARLODEL) 5 MG capsule Take 1 capsule (5 mg total) by mouth daily. 90 capsule 3  . Calcium Carbonate-Vitamin D (CALTRATE 600+D) 600-400 MG-UNIT tablet Take 1 tablet by mouth 2 (two) times daily. (Patient taking differently: Take 1 tablet by mouth daily. )    . diltiazem (DILACOR XR) 240 MG 24 hr capsule Take 240 mg by mouth daily.    . empagliflozin (JARDIANCE) 10 MG TABS tablet Take 10 mg by mouth daily. (Patient taking differently: Take 5 mg by mouth daily. ) 90 tablet 3  . esomeprazole (NEXIUM) 20 MG capsule Take 1 capsule (20 mg total) by mouth daily at 12 noon.    . hydrochlorothiazide (HYDRODIURIL) 25 MG tablet Take 25 mg by mouth daily.    [name] ibandronate (BONIVA) 150  MG tablet Take 150 mg by mouth every 30 (thirty) days. Take in the morning with a full glass of water, on an empty stomach, and do not take anything else by mouth or lie down for the next 30 min.    . metFORMIN (GLUCOPHAGE) 500 MG tablet TAKE 1 TABLET(500 MG) BY MOUTH TWICE DAILY WITH A MEAL 180 tablet 3  . Multiple Vitamins-Minerals (ONE-A-DAY WEIGHT SMART ADVANCE) TABS Take 1 tablet by mouth daily.    . phenylephrine (SUDAFED PE) 10 MG TABS Take 10 mg by mouth every 4 (four) hours as needed. For allergies    . PROAIR HFA 108 (90 BASE) MCG/ACT inhaler inhale 2 puffs by mouth every 6 hours for 10 days  0  . ramipril (ALTACE) 10 MG tablet Take 10 mg by mouth daily.    . repaglinide (PRANDIN) 2 MG tablet TAKE 1 TABLET(2 MG) BY MOUTH THREE TIMES DAILY BEFORE MEALS 90 tablet 11  . UNABLE TO FIND Rx: L8000-Post Surgical Bras (Quantity: 6) [id number]- Silicone Breast Prosthesis (Quantity: 2) Dx: 174.9; bilateral mastectomies 1 each 0   No current facility-administered medications on file prior to visit.    Allergies  Allergen Reactions  . Sulfonamide Derivatives     Pt can not remember the reaction    Family History  Problem Relation Age of Onset  . Colon cancer Father 20  . Diabetes Mother   . Heart disease Mother   . Breast cancer Sister   . Esophageal cancer Neg Hx   . Stomach cancer Neg Hx   . Rectal cancer Neg Hx     BP 120/80   Pulse (!) 55   Ht 5\' 2"  (1.575 m)   Wt 206 lb (93.4 kg)   SpO2 98%   BMI 37.68 kg/m    Review of Systems She denies hypoglycemia    Objective:   Physical Exam VITAL SIGNS:  See vs page GENERAL: no distress Pulses: dorsalis pedis intact bilat.   MSK: no deformity of the feet CV: trace bilat leg edema Skin:  no ulcer on the feet.  normal color and temp on the feet. Neuro: sensation is intact to touch on the feet. Ext: there is bilateral onychomycosis of the toenails.     Lab Results  Component Value Date   HGBA1C 8.3 (A) 07/07/2019   Lab  Results  Component Value Date   CREATININE 0.68 09/23/2014   BUN 11 09/23/2014   NA 140 09/23/2014   K 3.5  09/23/2014   CL 97 09/23/2014   CO2 33 (H) 09/23/2014       Assessment & Plan:  Type 2 DM, with DN: he needs increased rx.  Edema: This limits rx options   Patient Instructions  Here is the phone number to call to get strips.   I have sent a prescription to your pharmacy, to add "Rybelsus." Please continue the same other medications check your blood sugar once a day.  vary the time of day when you check, between before the 3 meals, and at bedtime.  also check if you have symptoms of your blood sugar being too high or too low.  please keep a record of the readings and bring it to your next appointment here (or you can bring the meter itself).  You can write it on any piece of paper.  please call us sooner if your blood sugar goes below 70, or if you have a lot of readings over 200. Please come back for a follow-up appointment in 2 months.

## 2019-07-22 DIAGNOSIS — I10 Essential (primary) hypertension: Secondary | ICD-10-CM | POA: Diagnosis not present

## 2019-07-22 DIAGNOSIS — Z0000 Encounter for general adult medical examination without abnormal findings: Secondary | ICD-10-CM | POA: Diagnosis not present

## 2019-07-22 DIAGNOSIS — E7800 Pure hypercholesterolemia, unspecified: Secondary | ICD-10-CM | POA: Diagnosis not present

## 2019-07-22 DIAGNOSIS — Z1389 Encounter for screening for other disorder: Secondary | ICD-10-CM | POA: Diagnosis not present

## 2019-07-22 DIAGNOSIS — E1169 Type 2 diabetes mellitus with other specified complication: Secondary | ICD-10-CM | POA: Diagnosis not present

## 2019-07-22 DIAGNOSIS — M810 Age-related osteoporosis without current pathological fracture: Secondary | ICD-10-CM | POA: Diagnosis not present

## 2019-07-25 ENCOUNTER — Other Ambulatory Visit: Payer: Self-pay | Admitting: Internal Medicine

## 2019-07-25 DIAGNOSIS — M810 Age-related osteoporosis without current pathological fracture: Secondary | ICD-10-CM

## 2019-08-11 ENCOUNTER — Telehealth: Payer: Self-pay

## 2019-08-11 NOTE — Telephone Encounter (Signed)
FAXED [place]: [place]  Document: Rx for Diabetic testing supplies Other records requested: None requested  All above requested information has been faxed successfully to [employer] [facility] listed above. Documents and fax confirmation have been placed in the faxed file for future reference.

## 2019-08-25 DIAGNOSIS — E119 Type 2 diabetes mellitus without complications: Secondary | ICD-10-CM | POA: Diagnosis not present

## 2019-08-25 DIAGNOSIS — H5203 Hypermetropia, bilateral: Secondary | ICD-10-CM | POA: Diagnosis not present

## 2019-08-25 DIAGNOSIS — H524 Presbyopia: Secondary | ICD-10-CM | POA: Diagnosis not present

## 2019-08-25 LAB — HM DIABETES EYE EXAM

## 2019-09-22 ENCOUNTER — Other Ambulatory Visit: Payer: Self-pay

## 2019-09-22 ENCOUNTER — Encounter: Payer: Self-pay | Admitting: Endocrinology

## 2019-09-22 ENCOUNTER — Ambulatory Visit (INDEPENDENT_AMBULATORY_CARE_PROVIDER_SITE_OTHER): Payer: Medicare PPO | Admitting: Endocrinology

## 2019-09-22 VITALS — BP 124/70 | HR 64 | Ht 62.0 in | Wt 204.2 lb

## 2019-09-22 DIAGNOSIS — E1129 Type 2 diabetes mellitus with other diabetic kidney complication: Secondary | ICD-10-CM | POA: Diagnosis not present

## 2019-09-22 DIAGNOSIS — R809 Proteinuria, unspecified: Secondary | ICD-10-CM | POA: Diagnosis not present

## 2019-09-22 LAB — POCT GLYCOSYLATED HEMOGLOBIN (HGB A1C): Hemoglobin A1C: 7.5 % — AB (ref 4.0–5.6)

## 2019-09-22 MED ORDER — COLESEVELAM HCL 625 MG PO TABS: 625.0000 mg | ORAL_TABLET | Freq: Two times a day (BID) | ORAL | 3 refills | Status: DC

## 2019-09-22 NOTE — Patient Instructions (Addendum)
I have sent a prescription to your pharmacy, to add "colesevelam." Please continue the same other medications check your blood sugar once a day.  vary the time of day when you check, between before the 3 meals, and at bedtime.  also check if you have symptoms of your blood sugar being too high or too low.  please keep a record of the readings and bring it to your next appointment here (or you can bring the meter itself).  You can write it on any piece of paper.  please call us sooner if your blood sugar goes below 70, or if you have a lot of readings over 200. Please come back for a follow-up appointment in 3 months.

## 2019-09-22 NOTE — Progress Notes (Signed)
Subjective:    Patient ID: Krystal Watson, female    DOB: July 14, 1945, 74 y.o.   MRN: [id number]  HPI Pt returns for f/u of diabetes mellitus:  DM type: 2 Dx'ed: 7829  Complications: DN Therapy: 4 oral meds.  GDM: never.  DKA: never.  Severe hypoglycemia: never.  Pancreatitis: never.  Pancreatic imaging: normal on 2010 CT  SDOH: she declined Januvia and Onglyza, due to cost.  Other: she declines weight loss surgery: she did not tolerate pioglitazone (edema); she has never been on insulin; she takes Jardiance just 5 mg qd, due to vaginitis.   Interval history: No recent steroids.  Pt says cbg's are in the 200's.  pt states she feels well in general.  She takes meds as rx'ed.  She did not take Rybelsus, due to cost.   Past Medical History:  Diagnosis Date  . Allergy    sulfa  . Arthritis   . Cancer (HCC)    breast  . Chronic pain   . Diabetes mellitus   . Diverticulosis   . Gallstones   . GERD (gastroesophageal reflux disease)   . Hyperlipidemia   . Hypertension   . Obesity   . Shortness of breath    with exertion    Past Surgical History:  Procedure Laterality Date  . ABDOMINAL HYSTERECTOMY    . CHOLECYSTECTOMY    . COLONOSCOPY    . ERCP     Hx 2x  . SIMPLE MASTECTOMY WITH AXILLARY SENTINEL NODE BIOPSY  04/24/2012   Procedure: SIMPLE MASTECTOMY WITH AXILLARY SENTINEL NODE BIOPSY;  Surgeon: Haywood Lasso, MD;  Location: [facility];  Service: General;  Laterality: Bilateral;  Bilateral total mastectomy and bilateral sentinel node    Social History   Socioeconomic History  . Marital status: Married    Spouse name: Not on file  . Number of children: 2  . Years of education: Not on file  . Highest education level: Not on file  Occupational History  . Occupation: Pharmacist, hospital  Tobacco Use  . Smoking status: Never Smoker  . Smokeless tobacco: Never Used  Substance and Sexual Activity  . Alcohol use: No    Comment: rare  . Drug use: No  . Sexual activity: Yes   Other Topics Concern  . Not on file  Social History Narrative  . Not on file   Social Determinants of Health   Financial Resource Strain:   . Difficulty of Paying Living Expenses:   Food Insecurity:   . Worried About Charity fundraiser in the Last Year:   . Arboriculturist in the Last Year:   Transportation Needs:   . Film/video editor (Medical):   [name] Lack of Transportation (Non-Medical):   Physical Activity:   . Days of Exercise per Week:   . Minutes of Exercise per Session:   Stress:   . Feeling of Stress :   Social Connections:   . Frequency of Communication with Friends and Family:   . Frequency of Social Gatherings with Friends and Family:   . Attends Religious Services:   . Active Member of Clubs or Organizations:   . Attends Archivist Meetings:   [name] Marital Status:   Intimate Partner Violence:   . Fear of Current or Ex-Partner:   . Emotionally Abused:   [name] Physically Abused:   . Sexually Abused:     Current Outpatient Medications on File Prior to Visit  Medication Sig Dispense Refill  .  Accu-Chek Softclix Lancets lancets 1 each by Other route daily. E11.9 100 each 0  . Alum Hydroxide-Mag Carbonate (GAVISCON PO) Take 2 tablets by mouth daily as needed. For stomach    . aspirin 81 MG tablet Take 81 mg by mouth daily.    [name] atorvastatin (LIPITOR) 40 MG tablet Take 40 mg by mouth daily.    . Blood Glucose Monitoring Suppl (ACCU-CHEK GUIDE) w/Device KIT 1 each by Does not apply route daily. E11.9 1 kit 0  . bromocriptine (PARLODEL) 5 MG capsule Take 1 capsule (5 mg total) by mouth daily. 90 capsule 3  . Calcium Carbonate-Vitamin D (CALTRATE 600+D) 600-400 MG-UNIT tablet Take 1 tablet by mouth 2 (two) times daily. (Patient taking differently: Take 1 tablet by mouth daily. )    . diltiazem (DILACOR XR) 240 MG 24 hr capsule Take 240 mg by mouth daily.    . empagliflozin (JARDIANCE) 10 MG TABS tablet Take 10 mg by mouth daily. (Patient taking differently: Take  5 mg by mouth daily. ) 90 tablet 3  . esomeprazole (NEXIUM) 20 MG capsule Take 1 capsule (20 mg total) by mouth daily at 12 noon.    [name] glucose blood (ACCU-CHEK GUIDE) test strip 1 each by Other route daily. E11.9 100 each 0  . hydrochlorothiazide (HYDRODIURIL) 25 MG tablet Take 25 mg by mouth daily.    [name] ibandronate (BONIVA) 150 MG tablet Take 150 mg by mouth every 30 (thirty) days. Take in the morning with a full glass of water, on an empty stomach, and do not take anything else by mouth or lie down for the next 30 min.    . metFORMIN (GLUCOPHAGE) 500 MG tablet TAKE 1 TABLET(500 MG) BY MOUTH TWICE DAILY WITH A MEAL 180 tablet 3  . Multiple Vitamins-Minerals (ONE-A-DAY WEIGHT SMART ADVANCE) TABS Take 1 tablet by mouth daily.    . phenylephrine (SUDAFED PE) 10 MG TABS Take 10 mg by mouth every 4 (four) hours as needed. For allergies    . PROAIR HFA 108 (90 BASE) MCG/ACT inhaler inhale 2 puffs by mouth every 6 hours for 10 days  0  . ramipril (ALTACE) 10 MG tablet Take 10 mg by mouth daily.    . repaglinide (PRANDIN) 2 MG tablet TAKE 1 TABLET(2 MG) BY MOUTH THREE TIMES DAILY BEFORE MEALS 90 tablet 11  . UNABLE TO FIND Rx: L8000-Post Surgical Bras (Quantity: 6) V7858- Silicone Breast Prosthesis (Quantity: 2) Dx: 174.9; bilateral mastectomies 1 each 0   No current facility-administered medications on file prior to visit.    Allergies  Allergen Reactions  . Sulfonamide Derivatives     Pt can not remember the reaction    Family History  Problem Relation Age of Onset  . Colon cancer Father 61  . Diabetes Mother   . Heart disease Mother   . Breast cancer Sister   . Esophageal cancer Neg Hx   . Stomach cancer Neg Hx   . Rectal cancer Neg Hx     BP 124/70 (BP Location: Left Arm, Patient Position: Sitting, Cuff Size: Normal)   Pulse 64   Ht '5\' 2"'  (1.575 m)   Wt 204 lb 3.2 oz (92.6 kg)   SpO2 95%   BMI 37.35 kg/m    Review of Systems She denies hypoglycemia.      Objective:    Physical Exam VITAL SIGNS:  See vs page GENERAL: no distress Pulses: dorsalis pedis intact bilat.   MSK: no deformity of the feet CV: 2+  bilat leg edema Skin:  no ulcer on the feet.  normal color and temp on the feet. Neuro: sensation is intact to touch on the feet.   Ext: there is bilateral onychomycosis of the toenails.    Lab Results  Component Value Date   HGBA1C 7.5 (A) 09/22/2019   Lab Results  Component Value Date   CREATININE 0.68 09/23/2014   BUN 11 09/23/2014   NA 140 09/23/2014   K 3.5 09/23/2014   CL 97 09/23/2014   CO2 33 (H) 09/23/2014       Assessment & Plan:  Type 2 DM: She would benefit from increased rx, if it can be done with a regimen that avoids or minimizes hypoglycemia.  Edema: This limits rx options.    Patient Instructions  I have sent a prescription to your pharmacy, to add "colesevelam." Please continue the same other medications check your blood sugar once a day.  vary the time of day when you check, between before the 3 meals, and at bedtime.  also check if you have symptoms of your blood sugar being too high or too low.  please keep a record of the readings and bring it to your next appointment here (or you can bring the meter itself).  You can write it on any piece of paper.  please call us sooner if your blood sugar goes below 70, or if you have a lot of readings over 200. Please come back for a follow-up appointment in 3 months.

## 2019-09-23 ENCOUNTER — Other Ambulatory Visit: Payer: Self-pay | Admitting: Endocrinology

## 2019-09-23 DIAGNOSIS — R809 Proteinuria, unspecified: Secondary | ICD-10-CM

## 2019-10-02 ENCOUNTER — Telehealth: Payer: Self-pay | Admitting: Adult Health

## 2019-10-02 NOTE — Telephone Encounter (Signed)
Rescheduled SCP visit due to provider template change. Pt confirmed new appt date and time.

## 2019-10-13 ENCOUNTER — Other Ambulatory Visit: Payer: Self-pay

## 2019-10-13 ENCOUNTER — Ambulatory Visit
Admission: RE | Admit: 2019-10-13 | Discharge: 2019-10-13 | Disposition: A | Discharge status: Home / Self Care | Payer: Medicare PPO | Source: Ambulatory Visit | Attending: Internal Medicine | Admitting: Internal Medicine

## 2019-10-13 DIAGNOSIS — M810 Age-related osteoporosis without current pathological fracture: Secondary | ICD-10-CM

## 2019-10-13 DIAGNOSIS — Z780 Asymptomatic menopausal state: Secondary | ICD-10-CM | POA: Diagnosis not present

## 2019-10-27 ENCOUNTER — Telehealth: Payer: Self-pay | Admitting: Endocrinology

## 2019-10-27 NOTE — Telephone Encounter (Signed)
Pt has declined name brand meds, due to cost.  Please move up next ov to next available.

## 2019-10-27 NOTE — Telephone Encounter (Signed)
Please refer to Dr. Cordelia Pen request below to move up appt. Will discuss requested change during appt.

## 2019-10-27 NOTE — Telephone Encounter (Signed)
LMTCB to move appointment sooner, also sent mychart message.

## 2019-10-27 NOTE — Telephone Encounter (Signed)
Please review PCP recommendation below. Next office visit:   Next Appt With Internal Medicine Renato Shin, MD) 12/29/2019 at 8:45 AM

## 2019-10-27 NOTE — Telephone Encounter (Signed)
[place] Pharmacist calling about - she just had an appointment at [facility] her AM fasting blood sugars are high (175 to 225).  Patient has stopped taking Bromocriptine.  The recommendation from the [facility] Pharmacist is to possibly change patient to Trulicity and away from [place].  Her number for call back is [phone]

## 2019-10-28 DIAGNOSIS — E119 Type 2 diabetes mellitus without complications: Secondary | ICD-10-CM | POA: Diagnosis not present

## 2019-10-28 DIAGNOSIS — N183 Chronic kidney disease, stage 3 unspecified: Secondary | ICD-10-CM | POA: Diagnosis not present

## 2019-10-28 DIAGNOSIS — M810 Age-related osteoporosis without current pathological fracture: Secondary | ICD-10-CM | POA: Diagnosis not present

## 2019-10-28 DIAGNOSIS — I10 Essential (primary) hypertension: Secondary | ICD-10-CM | POA: Diagnosis not present

## 2019-10-28 DIAGNOSIS — E1169 Type 2 diabetes mellitus with other specified complication: Secondary | ICD-10-CM | POA: Diagnosis not present

## 2019-10-28 DIAGNOSIS — E7800 Pure hypercholesterolemia, unspecified: Secondary | ICD-10-CM | POA: Diagnosis not present

## 2019-11-03 ENCOUNTER — Encounter: Payer: Medicare Other | Admitting: Adult Health

## 2019-11-14 ENCOUNTER — Inpatient Hospital Stay: Payer: Medicare PPO | Attending: Adult Health | Admitting: Adult Health

## 2019-11-14 ENCOUNTER — Telehealth: Payer: Self-pay

## 2019-11-14 ENCOUNTER — Telehealth: Payer: Self-pay | Admitting: Adult Health

## 2019-11-14 NOTE — Telephone Encounter (Signed)
Called pt to check on her after she was Krystal Watson/NS to her appt with Mendel Ryder today. Pt states she forgot and is out of town. Pt requests to have scheduling call her to r/s. Message sent to scheduling.

## 2019-11-14 NOTE — Progress Notes (Deleted)
CLINIC:  Survivorship   REASON FOR VISIT:  Routine follow-up for history of breast cancer.   BRIEF ONCOLOGIC HISTORY:  Oncology History  Breast cancer, right breast, UOQ, DCIS  03/12/2012 Initial Diagnosis   ductal carcinoma in situ with comedonecrosis and calcifications. Tumor was ER negative PR negative   04/24/2012 Surgery   Left Mastectomy: DCIS with comedonecrosis and calcifications. ER 32% PR negative; right mastectomy grade 3 multifocal DCIS 2.6, 1.5, and 10.0 cm. 1SLN neg, ER/PR Neg      INTERVAL HISTORY:  Krystal Watson presents to the [facility] today for routine follow-up for her history of breast cancer.  Overall, she reports feeling quite well.   She has had some issues with blood sugar elevation this past year.  She is working on her diet and has cut out sodas.  She is exercising by walking.  She says she struggles with limiting carbohydrates at times.  She is delighted however because she has lost 20 pounds.  Krystal Watson continues to f/u with her PCP regularly.  She notes that she is up to date with her cancer screenings.  She denies any other new issues today.     REVIEW OF SYSTEMS:  Review of Systems  Constitutional: Negative for appetite change, fatigue, fever and unexpected weight change.  HENT:   Negative for hearing loss, lump/mass, sore throat and trouble swallowing.   Eyes: Negative for eye problems and icterus.  Respiratory: Negative for chest tightness, cough and shortness of breath.   Cardiovascular: Negative for chest pain, leg swelling and palpitations.  Gastrointestinal: Negative for abdominal distention, abdominal pain, constipation, diarrhea, nausea and vomiting.  Endocrine: Negative for hot flashes.  Skin: Negative for itching and rash.  Neurological: Negative for dizziness, extremity weakness and numbness.  Hematological: Negative for adenopathy. Does not bruise/bleed easily.  Psychiatric/Behavioral: Negative for depression. The patient is not  nervous/anxious.   Breast: Denies any new nodularity, masses, tenderness, nipple changes, or nipple discharge.       PAST MEDICAL/SURGICAL HISTORY:  Past Medical History:  Diagnosis Date  . Allergy    sulfa  . Arthritis   . Cancer (HCC)    breast  . Chronic pain   . Diabetes mellitus   . Diverticulosis   . Gallstones   . GERD (gastroesophageal reflux disease)   . Hyperlipidemia   . Hypertension   . Obesity   . Shortness of breath    with exertion   Past Surgical History:  Procedure Laterality Date  . ABDOMINAL HYSTERECTOMY    . CHOLECYSTECTOMY    . COLONOSCOPY    . ERCP     Hx 2x  . SIMPLE MASTECTOMY WITH AXILLARY SENTINEL NODE BIOPSY  04/24/2012   Procedure: SIMPLE MASTECTOMY WITH AXILLARY SENTINEL NODE BIOPSY;  Surgeon: Haywood Lasso, MD;  Location: [place];  Service: General;  Laterality: Bilateral;  Bilateral total mastectomy and bilateral sentinel node     ALLERGIES:  Allergies  Allergen Reactions  . Sulfonamide Derivatives     Pt can not remember the reaction     CURRENT MEDICATIONS:  Outpatient Encounter Medications as of 11/14/2019  Medication Sig Note  . Alum Hydroxide-Mag Carbonate (GAVISCON PO) Take 2 tablets by mouth daily as needed. For stomach   . aspirin 81 MG tablet Take 81 mg by mouth daily.   [name] atorvastatin (LIPITOR) 40 MG tablet Take 40 mg by mouth daily.   . Blood Glucose Monitoring Suppl (ACCU-CHEK GUIDE) w/Device KIT 1 each by Does not apply route  daily. E11.9   . bromocriptine (PARLODEL) 5 MG capsule Take 1 capsule (5 mg total) by mouth daily.   . Calcium Carbonate-Vitamin D (CALTRATE 600+D) 600-400 MG-UNIT tablet Take 1 tablet by mouth 2 (two) times daily. (Patient taking differently: Take 1 tablet by mouth daily. )   . colesevelam (WELCHOL) 625 MG tablet Take 1 tablet (625 mg total) by mouth 2 (two) times daily with a meal.   . diltiazem (DILACOR XR) 240 MG 24 hr capsule Take 240 mg by mouth daily.   . empagliflozin (JARDIANCE) 10 MG  TABS tablet Take 10 mg by mouth daily. (Patient taking differently: Take 5 mg by mouth daily. )   . esomeprazole (NEXIUM) 20 MG capsule Take 1 capsule (20 mg total) by mouth daily at 12 noon.   [name] glucose blood (ACCU-CHEK GUIDE) test strip 1 each by Other route daily. E11.9   . hydrochlorothiazide (HYDRODIURIL) 25 MG tablet Take 25 mg by mouth daily.   [name] ibandronate (BONIVA) 150 MG tablet Take 150 mg by mouth every 30 (thirty) days. Take in the morning with a full glass of water, on an empty stomach, and do not take anything else by mouth or lie down for the next 30 min.   . Lancets (ONETOUCH DELICA PLUS QIONGE95M) MISC USE AS DIRECTED TO TEST BLOOD SUGAR DAILY   . metFORMIN (GLUCOPHAGE) 500 MG tablet TAKE 1 TABLET(500 MG) BY MOUTH TWICE DAILY WITH A MEAL   . Multiple Vitamins-Minerals (ONE-A-DAY WEIGHT SMART ADVANCE) TABS Take 1 tablet by mouth daily.   . phenylephrine (SUDAFED PE) 10 MG TABS Take 10 mg by mouth every 4 (four) hours as needed. For allergies   . PROAIR HFA 108 (90 BASE) MCG/ACT inhaler inhale 2 puffs by mouth every 6 hours for 10 days 09/23/2014: Received from: External Pharmacy  . ramipril (ALTACE) 10 MG tablet Take 10 mg by mouth daily.   . repaglinide (PRANDIN) 2 MG tablet TAKE 1 TABLET(2 MG) BY MOUTH THREE TIMES DAILY BEFORE MEALS   . UNABLE TO FIND Rx: L8000-Post Surgical Bras (Quantity: 6) [id number]- Silicone Breast Prosthesis (Quantity: 2) Dx: 174.9; bilateral mastectomies    No facility-administered encounter medications on file as of 11/14/2019.     ONCOLOGIC FAMILY HISTORY:  Family History  Problem Relation Age of Onset  . Colon cancer Father 25  . Diabetes Mother   . Heart disease Mother   . Breast cancer Sister   . Esophageal cancer Neg Hx   . Stomach cancer Neg Hx   . Rectal cancer Neg Hx      SOCIAL HISTORY:  Social History   Socioeconomic History  . Marital status: Married    Spouse name: Not on file  . Number of children: 2  . Years of education: Not  on file  . Highest education level: Not on file  Occupational History  . Occupation: Pharmacist, hospital  Tobacco Use  . Smoking status: Never Smoker  . Smokeless tobacco: Never Used  Substance and Sexual Activity  . Alcohol use: No    Comment: rare  . Drug use: No  . Sexual activity: Yes  Other Topics Concern  . Not on file  Social History Narrative  . Not on file   Social Determinants of Health   Financial Resource Strain:   . Difficulty of Paying Living Expenses:   Food Insecurity:   . Worried About Charity fundraiser in the Last Year:   . [place] in the Last Year:  Transportation Needs:   . Film/video editor (Medical):   [name] Lack of Transportation (Non-Medical):   Physical Activity:   . Days of Exercise per Week:   . Minutes of Exercise per Session:   Stress:   . Feeling of Stress :   Social Connections:   . Frequency of Communication with Friends and Family:   . Frequency of Social Gatherings with Friends and Family:   . Attends Religious Services:   . Active Member of Clubs or Organizations:   . Attends Archivist Meetings:   [name] Marital Status:   Intimate Partner Violence:   . Fear of Current or Ex-Partner:   . Emotionally Abused:   [name] Physically Abused:   . Sexually Abused:       PHYSICAL EXAMINATION:  Vital Signs: There were no vitals filed for this visit. There were no vitals filed for this visit. General: Well-nourished, well-appearing female in no acute distress.  Unaccompanied today.   HEENT: Head is normocephalic.  Pupils equal and reactive to light. Conjunctivae clear without exudate.  Sclerae anicteric. Oral mucosa is pink, moist.  Oropharynx is pink without lesions or erythema.  Lymph: No cervical, supraclavicular, or infraclavicular lymphadenopathy noted on palpation.  Cardiovascular: Regular rate and rhythm.[name] Respiratory: Clear to auscultation bilaterally. Chest expansion symmetric; breathing non-labored.  Breast Exam:  -Bilateral  breasts are surgically absent, no nodules, masses, skin thickening/tenderness or sign of recurrence present. -Axilla: No axillary adenopathy bilaterally.  GI: Abdomen soft and round; non-tender, non-distended. Bowel sounds normoactive. No hepatosplenomegaly.   GU: Deferred.  Neuro: No focal deficits. Steady gait.  Psych: Mood and affect normal and appropriate for situation.  MSK: No focal spinal tenderness to palpation, full range of motion in bilateral upper extremities Extremities: No edema. Skin: Warm and dry.  LABORATORY DATA:  None for this visit   DIAGNOSTIC IMAGING:  Most recent mammogram: n/a s/p bilateral mastectomies    ASSESSMENT AND PLAN:  Ms.. Uncapher is a pleasant 74 y.o. female with history of Stage 0 bilateral breast DCIS, ER-/PR-, diagnosed in 04/2012, treated with bilateral mastectomies.  [facility] for surveillance and routine follow-up.   1. History of breast cancer:  Ms. Kham is currently clinically and radiographically without evidence of disease or recurrence of breast cancer. I offered her graduation today, however she would like to continue to return here for her annual breast exam, which I am happy to accommodate.  I encouraged her to call me with any questions or concerns before her next visit at the cancer center, and I would be happy to see her sooner, if needed.    2. Bone health:  Given Ms. Younis's age, history of breast cancer, she is at risk for bone demineralization. She will f/u with her PCP about this. She was given education on specific food and activities to promote bone health.  3. Cancer screening:  Due to Ms. Seider's history and her age, she should receive screening for skin cancers, colon cancer. She was encouraged to follow-up with her PCP for appropriate cancer screenings.   4. Health maintenance and wellness promotion: Ms. Marcelli was encouraged to consume 5-7 servings of fruits and vegetables per day. We reviewed  different foods to focus on and limit, including continuing to limit her carbohydrates, desserts and sugary beverages, and focusing on lean meats such as [place], chicken, and fish.  She was also encouraged to engage in moderate to vigorous exercise for 30 minutes per day most days of  the week. I congratulated on her weight loss progress and encouraged her to keep going.  She was instructed to limit her alcohol consumption and continue to abstain from tobacco use.    Dispo:  -Return to cancer center in one year for LTS follow up   A total of (20) minutes of face-to-face time was spent with this patient with greater than 50% of that time in counseling and care-coordination.   Krystal Phlegm, NP Survivorship Program [facility] [phone]   Note: PRIMARY CARE PROVIDER [place], [place] [phone]

## 2019-11-14 NOTE — Telephone Encounter (Signed)
Called pt per 8/13 sch msg- no answer. Left message for patient to call back to reschedule appt.   

## 2019-11-17 ENCOUNTER — Telehealth: Payer: Self-pay | Admitting: Adult Health

## 2019-11-17 NOTE — Telephone Encounter (Signed)
Rescheduled appointment per 8/16 scheduling message. Patient is aware of updated appointment date and time.

## 2019-12-22 ENCOUNTER — Ambulatory Visit: Payer: Medicare PPO | Admitting: Adult Health

## 2019-12-22 ENCOUNTER — Ambulatory Visit (INDEPENDENT_AMBULATORY_CARE_PROVIDER_SITE_OTHER): Payer: Medicare PPO | Admitting: Endocrinology

## 2019-12-22 ENCOUNTER — Other Ambulatory Visit: Payer: Self-pay

## 2019-12-22 ENCOUNTER — Encounter: Payer: Self-pay | Admitting: Endocrinology

## 2019-12-22 VITALS — BP 142/74 | HR 73 | Temp 98.1°F | Ht 62.0 in | Wt 208.0 lb

## 2019-12-22 DIAGNOSIS — R809 Proteinuria, unspecified: Secondary | ICD-10-CM | POA: Diagnosis not present

## 2019-12-22 DIAGNOSIS — E1129 Type 2 diabetes mellitus with other diabetic kidney complication: Secondary | ICD-10-CM | POA: Diagnosis not present

## 2019-12-22 LAB — POCT GLYCOSYLATED HEMOGLOBIN (HGB A1C): Hemoglobin A1C: 8.3 % — AB (ref 4.0–5.6)

## 2019-12-22 MED ORDER — ACARBOSE 25 MG PO TABS: 25.0000 mg | ORAL_TABLET | Freq: Three times a day (TID) | ORAL | 3 refills | Status: DC

## 2019-12-22 NOTE — Patient Instructions (Addendum)
I have sent a prescription to your pharmacy, to add "acarbose." Please continue the same other medications.   check your blood sugar once a day.  vary the time of day when you check, between before the 3 meals, and at bedtime.  also check if you have symptoms of your blood sugar being too high or too low.  please keep a record of the readings and bring it to your next appointment here (or you can bring the meter itself).  You can write it on any piece of paper.  please call us sooner if your blood sugar goes below 70, or if you have a lot of readings over 200. Please come back for a follow-up appointment in 2 months.

## 2019-12-22 NOTE — Progress Notes (Signed)
Subjective:    Patient ID: Krystal Watson Born, female    DOB: [birth year], 74 y.o.   MRN: [id number]  HPI Pt returns for f/u of diabetes mellitus:  DM type: 2 Dx'ed: 6295  Complications: DN Therapy: 3 oral meds.  GDM: never.  DKA: never.  Severe hypoglycemia: never.  Pancreatitis: never.  Pancreatic imaging: normal on 2010 CT  SDOH: she declined Januvia, Rybelsus, and Onglyza, due to cost.  Other: she declines weight loss surgery: she did not tolerate pioglitazone (edema); she has never been on insulin; she takes Jardiance just 5 mg qd, due to vaginitis.   Interval history: No recent steroids.  Pt says cbg's are in the 200's.  She takes meds as rx'ed.  She stopped bromocriptine, as she says it caused vaginitis.   Past Medical History:  Diagnosis Date  . Allergy    sulfa  . Arthritis   . Cancer (HCC)    breast  . Chronic pain   . Diabetes mellitus   . Diverticulosis   . Gallstones   . GERD (gastroesophageal reflux disease)   . Hyperlipidemia   . Hypertension   . Obesity   . Shortness of breath    with exertion    Past Surgical History:  Procedure Laterality Date  . ABDOMINAL HYSTERECTOMY    . CHOLECYSTECTOMY    . COLONOSCOPY    . ERCP     Hx 2x  . SIMPLE MASTECTOMY WITH AXILLARY SENTINEL NODE BIOPSY  04/24/2012   Procedure: SIMPLE MASTECTOMY WITH AXILLARY SENTINEL NODE BIOPSY;  Surgeon: Haywood Lasso, MD;  Location: [facility];  Service: General;  Laterality: Bilateral;  Bilateral total mastectomy and bilateral sentinel node    Social History   Socioeconomic History  . Marital status: Married    Spouse name: Not on file  . Number of children: 2  . Years of education: Not on file  . Highest education level: Not on file  Occupational History  . Occupation: Pharmacist, hospital  Tobacco Use  . Smoking status: Never Smoker  . Smokeless tobacco: Never Used  Substance and Sexual Activity  . Alcohol use: No    Comment: rare  . Drug use: No  . Sexual activity: Yes  Other  Topics Concern  . Not on file  Social History Narrative  . Not on file   Social Determinants of Health   Financial Resource Strain:   . Difficulty of Paying Living Expenses: Not on file  Food Insecurity:   . Worried About Charity fundraiser in the Last Year: Not on file  . Ran Out of Food in the Last Year: Not on file  Transportation Needs:   . Lack of Transportation (Medical): Not on file  . Lack of Transportation (Non-Medical): Not on file  Physical Activity:   . Days of Exercise per Week: Not on file  . Minutes of Exercise per Session: Not on file  Stress:   . Feeling of Stress : Not on file  Social Connections:   . Frequency of Communication with Friends and Family: Not on file  . Frequency of Social Gatherings with Friends and Family: Not on file  . Attends Religious Services: Not on file  . Active Member of Clubs or Organizations: Not on file  . Attends Archivist Meetings: Not on file  . Marital Status: Not on file  Intimate Partner Violence:   . Fear of Current or Ex-Partner: Not on file  . Emotionally Abused: Not on file  .  Physically Abused: Not on file  . Sexually Abused: Not on file    Current Outpatient Medications on File Prior to Visit  Medication Sig Dispense Refill  . Alum Hydroxide-Mag Carbonate (GAVISCON PO) Take 2 tablets by mouth daily as needed. For stomach    . aspirin 81 MG tablet Take 81 mg by mouth daily.    [name] atorvastatin (LIPITOR) 40 MG tablet Take 40 mg by mouth daily.    . Blood Glucose Monitoring Suppl (ACCU-CHEK GUIDE) w/Device KIT 1 each by Does not apply route daily. E11.9 1 kit 0  . Calcium Carbonate-Vitamin D (CALTRATE 600+D) 600-400 MG-UNIT tablet Take 1 tablet by mouth 2 (two) times daily. (Patient taking differently: Take 1 tablet by mouth daily. )    . colesevelam (WELCHOL) 625 MG tablet Take 1 tablet (625 mg total) by mouth 2 (two) times daily with a meal. 90 tablet 3  . diltiazem (DILACOR XR) 240 MG 24 hr capsule Take 240  mg by mouth daily.    [name] esomeprazole (NEXIUM) 20 MG capsule Take 1 capsule (20 mg total) by mouth daily at 12 noon.    [name] glucose blood (ACCU-CHEK GUIDE) test strip 1 each by Other route daily. E11.9 100 each 0  . hydrochlorothiazide (HYDRODIURIL) 25 MG tablet Take 25 mg by mouth daily.    [name] ibandronate (BONIVA) 150 MG tablet Take 150 mg by mouth every 30 (thirty) days. Take in the morning with a full glass of water, on an empty stomach, and do not take anything else by mouth or lie down for the next 30 min.    . Lancets (ONETOUCH DELICA PLUS [id number]) MISC USE AS DIRECTED TO TEST BLOOD SUGAR DAILY 100 each 0  . metFORMIN (GLUCOPHAGE) 500 MG tablet TAKE 1 TABLET(500 MG) BY MOUTH TWICE DAILY WITH A MEAL 180 tablet 3  . Multiple Vitamins-Minerals (ONE-A-DAY WEIGHT SMART ADVANCE) TABS Take 1 tablet by mouth daily.    . phenylephrine (SUDAFED PE) 10 MG TABS Take 10 mg by mouth every 4 (four) hours as needed. For allergies    . PROAIR HFA 108 (90 BASE) MCG/ACT inhaler inhale 2 puffs by mouth every 6 hours for 10 days  0  . ramipril (ALTACE) 10 MG tablet Take 10 mg by mouth daily.    . repaglinide (PRANDIN) 2 MG tablet TAKE 1 TABLET(2 MG) BY MOUTH THREE TIMES DAILY BEFORE MEALS 90 tablet 11  . UNABLE TO FIND Rx: L8000-Post Surgical Bras (Quantity: 6) [id number]- Silicone Breast Prosthesis (Quantity: 2) Dx: 174.9; bilateral mastectomies 1 each 0   No current facility-administered medications on file prior to visit.    Allergies  Allergen Reactions  . Sulfonamide Derivatives     Pt can not remember the reaction    Family History  Problem Relation Age of Onset  . Colon cancer Father 41  . Diabetes Mother   . Heart disease Mother   . Breast cancer Sister   . Esophageal cancer Neg Hx   . Stomach cancer Neg Hx   . Rectal cancer Neg Hx     BP (!) 142/74 (BP Location: Left Arm, Patient Position: Sitting, Cuff Size: Normal)   Pulse 73   Temp 98.1 F (36.7 C) (Oral)   Ht '5\' 2"'  (1.575 m)   Wt 208  lb (94.3 kg)   SpO2 96%   BMI 38.04 kg/m    Review of Systems     Objective:   Physical Exam VITAL SIGNS:  See vs page GENERAL: no distress  Pulses: dorsalis pedis intact bilat.   MSK: no deformity of the feet CV: 1+ bilat leg edema Skin:  no ulcer on the feet.  normal color and temp on the feet. Neuro: sensation is intact to touch on the feet.   Ext: there is bilateral onychomycosis of the toenails.  Both great toenails are absent.    Lab Results  Component Value Date   HGBA1C 8.3 (A) 12/22/2019       Assessment & Plan:  Type 2 DM, with DM: uncontrolled.  She declines insulin Vaginitis, due to parlodel: This limits rx options  Patient Instructions  I have sent a prescription to your pharmacy, to add "acarbose." Please continue the same other medications.   check your blood sugar once a day.  vary the time of day when you check, between before the 3 meals, and at bedtime.  also check if you have symptoms of your blood sugar being too high or too low.  please keep a record of the readings and bring it to your next appointment here (or you can bring the meter itself).  You can write it on any piece of paper.  please call us sooner if your blood sugar goes below 70, or if you have a lot of readings over 200. Please come back for a follow-up appointment in 2 months.

## 2019-12-29 ENCOUNTER — Ambulatory Visit: Payer: Medicare PPO | Admitting: Endocrinology

## 2020-01-05 ENCOUNTER — Ambulatory Visit: Payer: Medicare PPO | Admitting: Endocrinology

## 2020-01-06 ENCOUNTER — Other Ambulatory Visit: Payer: Self-pay | Admitting: Endocrinology

## 2020-01-06 DIAGNOSIS — R809 Proteinuria, unspecified: Secondary | ICD-10-CM

## 2020-01-07 ENCOUNTER — Inpatient Hospital Stay: Payer: Medicare PPO | Admitting: Adult Health

## 2020-01-19 DIAGNOSIS — E7800 Pure hypercholesterolemia, unspecified: Secondary | ICD-10-CM | POA: Diagnosis not present

## 2020-01-19 DIAGNOSIS — N1831 Chronic kidney disease, stage 3a: Secondary | ICD-10-CM | POA: Diagnosis not present

## 2020-01-19 DIAGNOSIS — M810 Age-related osteoporosis without current pathological fracture: Secondary | ICD-10-CM | POA: Diagnosis not present

## 2020-01-19 DIAGNOSIS — I10 Essential (primary) hypertension: Secondary | ICD-10-CM | POA: Diagnosis not present

## 2020-01-23 ENCOUNTER — Inpatient Hospital Stay: Payer: Medicare PPO | Attending: Adult Health | Admitting: Medical

## 2020-01-23 ENCOUNTER — Inpatient Hospital Stay: Payer: Medicare PPO | Admitting: Adult Health

## 2020-01-23 DIAGNOSIS — C50911 Malignant neoplasm of unspecified site of right female breast: Secondary | ICD-10-CM

## 2020-01-27 NOTE — Progress Notes (Signed)
The patient presented for her annual survivorship visit today.  She had a young child with her.  She was asked to reschedule.  Sandi Mealy, MHS, PA-C Physician Assistant

## 2020-02-23 ENCOUNTER — Other Ambulatory Visit: Payer: Self-pay

## 2020-02-23 ENCOUNTER — Encounter: Payer: Medicare PPO | Attending: Endocrinology | Admitting: Nutrition

## 2020-02-23 ENCOUNTER — Ambulatory Visit (INDEPENDENT_AMBULATORY_CARE_PROVIDER_SITE_OTHER): Payer: Medicare PPO | Admitting: Endocrinology

## 2020-02-23 VITALS — BP 118/60 | HR 67 | Ht 63.25 in | Wt 205.0 lb

## 2020-02-23 DIAGNOSIS — R809 Proteinuria, unspecified: Secondary | ICD-10-CM | POA: Diagnosis not present

## 2020-02-23 DIAGNOSIS — E1165 Type 2 diabetes mellitus with hyperglycemia: Secondary | ICD-10-CM | POA: Insufficient documentation

## 2020-02-23 DIAGNOSIS — E1129 Type 2 diabetes mellitus with other diabetic kidney complication: Secondary | ICD-10-CM

## 2020-02-23 LAB — POCT GLYCOSYLATED HEMOGLOBIN (HGB A1C): Hemoglobin A1C: 9.4 % — AB (ref 4.0–5.6)

## 2020-02-23 MED ORDER — LANTUS SOLOSTAR 100 UNIT/ML ~~LOC~~ SOPN: 10.0000 [IU] | PEN_INJECTOR | SUBCUTANEOUS | 99 refills | Status: DC

## 2020-02-23 NOTE — Patient Instructions (Addendum)
I have sent a prescription to your pharmacy, to add "Lantus." Please continue the same other diabetes medications for now. Please call or message [place] next week, to tell us how the blood sugar is doing.   check your blood sugar once a day.  vary the time of day when you check, between before the 3 meals, and at bedtime.  also check if you have symptoms of your blood sugar being too high or too low.  please keep a record of the readings and bring it to your next appointment here (or you can bring the meter itself).  You can write it on any piece of paper.  please call us sooner if your blood sugar goes below 70, or if you have a lot of readings over 200. Please come back for a follow-up appointment in 2 months.

## 2020-02-23 NOTE — Progress Notes (Signed)
Subjective:    Patient ID: Krystal Watson, female    DOB: 02/03/1946, 74 y.o.   MRN: [id number]  HPI Pt returns for f/u of diabetes mellitus:  DM type: 2 Dx'ed: 1245  Complications: DN Therapy: 3 oral meds.  GDM: never.  DKA: never.  Severe hypoglycemia: never.  Pancreatitis: never.  Pancreatic imaging: normal on 2010 CT  SDOH: she declined Januvia, Rybelsus, and Onglyza, due to cost.  Other: she declines weight loss surgery: she did not tolerate pioglitazone (edema); she has never been on insulin; she did not tolerate Jardiance or bromocriptine (vaginitis) Interval history: No recent steroids.  Pt says cbg's are still in the 200's.  She takes meds as rx'ed.   Past Medical History:  Diagnosis Date  . Allergy    sulfa  . Arthritis   . Cancer (HCC)    breast  . Chronic pain   . Diabetes mellitus   . Diverticulosis   . Gallstones   . GERD (gastroesophageal reflux disease)   . Hyperlipidemia   . Hypertension   . Obesity   . Shortness of breath    with exertion    Past Surgical History:  Procedure Laterality Date  . ABDOMINAL HYSTERECTOMY    . CHOLECYSTECTOMY    . COLONOSCOPY    . ERCP     Hx 2x  . SIMPLE MASTECTOMY WITH AXILLARY SENTINEL NODE BIOPSY  04/24/2012   Procedure: SIMPLE MASTECTOMY WITH AXILLARY SENTINEL NODE BIOPSY;  Surgeon: Haywood Lasso, MD;  Location: [facility];  Service: General;  Laterality: Bilateral;  Bilateral total mastectomy and bilateral sentinel node    Social History   Socioeconomic History  . Marital status: Married    Spouse name: Not on file  . Number of children: 2  . Years of education: Not on file  . Highest education level: Not on file  Occupational History  . Occupation: Pharmacist, hospital  Tobacco Use  . Smoking status: Never Smoker  . Smokeless tobacco: Never Used  Substance and Sexual Activity  . Alcohol use: No    Comment: rare  . Drug use: No  . Sexual activity: Yes  Other Topics Concern  . Not on file  Social History  Narrative  . Not on file   Social Determinants of Health   Financial Resource Strain:   . Difficulty of Paying Living Expenses: Not on file  Food Insecurity:   . Worried About Charity fundraiser in the Last Year: Not on file  . Ran Out of Food in the Last Year: Not on file  Transportation Needs:   . Lack of Transportation (Medical): Not on file  . Lack of Transportation (Non-Medical): Not on file  Physical Activity:   . Days of Exercise per Week: Not on file  . Minutes of Exercise per Session: Not on file  Stress:   . Feeling of Stress : Not on file  Social Connections:   . Frequency of Communication with Friends and Family: Not on file  . Frequency of Social Gatherings with Friends and Family: Not on file  . Attends Religious Services: Not on file  . Active Member of Clubs or Organizations: Not on file  . Attends Archivist Meetings: Not on file  . Marital Status: Not on file  Intimate Partner Violence:   . Fear of Current or Ex-Partner: Not on file  . Emotionally Abused: Not on file  . Physically Abused: Not on file  . Sexually Abused: Not on file  Current Outpatient Medications on File Prior to Visit  Medication Sig Dispense Refill  . acarbose (PRECOSE) 25 MG tablet Take 1 tablet (25 mg total) by mouth 3 (three) times daily with meals. 90 tablet 3  . aspirin 81 MG tablet Take 81 mg by mouth daily.    [name] atorvastatin (LIPITOR) 40 MG tablet Take 40 mg by mouth daily.    . Calcium Carbonate-Vitamin D (CALTRATE 600+D) 600-400 MG-UNIT tablet Take 1 tablet by mouth 2 (two) times daily. (Patient taking differently: Take 1 tablet by mouth daily. )    . colesevelam (WELCHOL) 625 MG tablet Take 1 tablet (625 mg total) by mouth 2 (two) times daily with a meal. 90 tablet 3  . diltiazem (DILACOR XR) 240 MG 24 hr capsule Take 240 mg by mouth daily.    [name] esomeprazole (NEXIUM) 20 MG capsule Take 1 capsule (20 mg total) by mouth daily at 12 noon.    . hydrochlorothiazide  (HYDRODIURIL) 25 MG tablet Take 25 mg by mouth daily.    [name] ibandronate (BONIVA) 150 MG tablet Take 150 mg by mouth every 30 (thirty) days. Take in the morning with a full glass of water, on an empty stomach, and do not take anything else by mouth or lie down for the next 30 min.    . Lancets (ONETOUCH DELICA PLUS [id number]) MISC USE AS DIRECTED TO TEST BLOOD SUGAR DAILY 100 each 0  . metFORMIN (GLUCOPHAGE) 500 MG tablet TAKE 1 TABLET(500 MG) BY MOUTH TWICE DAILY WITH A MEAL 180 tablet 3  . Multiple Vitamins-Minerals (ONE-A-DAY WEIGHT SMART ADVANCE) TABS Take 1 tablet by mouth daily.    Glory Rosebush VERIO test strip USE AS DIRECTED TO TEST BLOOD SUGAR DAILY 100 strip 1  . phenylephrine (SUDAFED PE) 10 MG TABS Take 10 mg by mouth every 4 (four) hours as needed. For allergies    . PROAIR HFA 108 (90 BASE) MCG/ACT inhaler inhale 2 puffs by mouth every 6 hours for 10 days  0  . ramipril (ALTACE) 10 MG tablet Take 10 mg by mouth daily.    . repaglinide (PRANDIN) 2 MG tablet TAKE 1 TABLET(2 MG) BY MOUTH THREE TIMES DAILY BEFORE MEALS 90 tablet 11  . UNABLE TO FIND Rx: L8000-Post Surgical Bras (Quantity: 6) [id number]- Silicone Breast Prosthesis (Quantity: 2) Dx: 174.9; bilateral mastectomies 1 each 0  . Alum Hydroxide-Mag Carbonate (GAVISCON PO) Take 2 tablets by mouth daily as needed. For stomach (Patient not taking: Reported on 02/23/2020)    . Blood Glucose Monitoring Suppl (ACCU-CHEK GUIDE) w/Device KIT 1 each by Does not apply route daily. E11.9 (Patient not taking: Reported on 02/23/2020) 1 kit 0   No current facility-administered medications on file prior to visit.    Allergies  Allergen Reactions  . Sulfonamide Derivatives     Pt can not remember the reaction    Family History  Problem Relation Age of Onset  . Colon cancer Father 22  . Diabetes Mother   . Heart disease Mother   . Breast cancer Sister   . Esophageal cancer Neg Hx   . Stomach cancer Neg Hx   . Rectal cancer Neg Hx     BP  118/60   Pulse 67   Ht 5' 3.25" (1.607 m)   Wt 205 lb (93 kg)   SpO2 96%   BMI 36.03 kg/m    Review of Systems     Objective:   Physical Exam VITAL SIGNS:  See vs page GENERAL: no  distress.     Lab Results  Component Value Date   HGBA1C 9.4 (A) 02/23/2020       Assessment & Plan:  Type 2 DM, with DN: uncontrolled.  I told her she will either need to add brand name oral rx, or start insulin.  She chooses the latter.  she declines multiple daily injections  Patient Instructions  I have sent a prescription to your pharmacy, to add "Lantus." Please continue the same other diabetes medications for now. Please call or message [place] next week, to tell us how the blood sugar is doing.   check your blood sugar once a day.  vary the time of day when you check, between before the 3 meals, and at bedtime.  also check if you have symptoms of your blood sugar being too high or too low.  please keep a record of the readings and bring it to your next appointment here (or you can bring the meter itself).  You can write it on any piece of paper.  please call us sooner if your blood sugar goes below 70, or if you have a lot of readings over 200. Please come back for a follow-up appointment in 2 months.

## 2020-02-24 ENCOUNTER — Telehealth: Payer: Self-pay | Admitting: Endocrinology

## 2020-02-24 ENCOUNTER — Encounter: Payer: Self-pay | Admitting: Endocrinology

## 2020-02-24 ENCOUNTER — Other Ambulatory Visit: Payer: Self-pay

## 2020-02-24 MED ORDER — PEN NEEDLES 31G X 5 MM MISC: 12 refills | Status: DC

## 2020-02-24 NOTE — Patient Instructions (Signed)
Take Lantus insulin daily Test blood sugar as directed by Dr. Loanne Drilling Call office if blood sugar drops low.

## 2020-02-24 NOTE — Progress Notes (Signed)
This was a very upset patient, not wanting to go on insulin or learn how to use it.  We discussed her emotions, and she admitted that she has been eating and drinking whatever she wanted. WE discussed the need for insulin now because her blood sugars have been running so high for a long time, and that her own body's insulin was not working as well.   She was shown how to use the Lantus pen, and she re demonstrated how to do this correctly, without giving herself the insulin.  WE reviewed the dose, timing, where to inject, and how to remove and dispose of the needles, and how to store this insulin.  She reported good understanding of all of this with no questions.    She admits to drinking sweet drinks, and eating desert.  Discussed some general dietary guidelines and the fact that if she stops drinking sweet drinks and limiting porion sizes of deserts, that her own body's insulin may become more effective.  But stressed that she will need to be on this insulin until DR. Loanne Drilling sees her again. WE also discussed low blood sugars--symptoms and treatments.  She reported good understanding of this, and the need to call the office if this occurs.

## 2020-02-24 NOTE — Telephone Encounter (Signed)
Patient called and requested pen needles to go with her new Lantus Pens.  Please sent to [facility] on [address]

## 2020-02-24 NOTE — Telephone Encounter (Signed)
RX sent to pharmacy  

## 2020-04-25 ENCOUNTER — Other Ambulatory Visit: Payer: Self-pay | Admitting: Endocrinology

## 2020-04-29 ENCOUNTER — Other Ambulatory Visit: Payer: Self-pay

## 2020-05-03 ENCOUNTER — Telehealth: Payer: Self-pay | Admitting: Endocrinology

## 2020-05-03 ENCOUNTER — Other Ambulatory Visit: Payer: Self-pay

## 2020-05-03 ENCOUNTER — Ambulatory Visit (INDEPENDENT_AMBULATORY_CARE_PROVIDER_SITE_OTHER): Payer: Medicare PPO | Admitting: Endocrinology

## 2020-05-03 ENCOUNTER — Other Ambulatory Visit: Payer: Self-pay | Admitting: *Deleted

## 2020-05-03 VITALS — BP 124/60 | HR 58 | Ht 62.0 in | Wt 206.6 lb

## 2020-05-03 DIAGNOSIS — E1129 Type 2 diabetes mellitus with other diabetic kidney complication: Secondary | ICD-10-CM

## 2020-05-03 DIAGNOSIS — R809 Proteinuria, unspecified: Secondary | ICD-10-CM | POA: Diagnosis not present

## 2020-05-03 LAB — POCT GLYCOSYLATED HEMOGLOBIN (HGB A1C): Hemoglobin A1C: 9.1 % — AB (ref 4.0–5.6)

## 2020-05-03 MED ORDER — ONETOUCH VERIO VI STRP: 1.0000 | ORAL_STRIP | Freq: Two times a day (BID) | 3 refills | Status: DC

## 2020-05-03 MED ORDER — COLESEVELAM HCL 625 MG PO TABS: ORAL_TABLET | ORAL | 3 refills | Status: DC

## 2020-05-03 MED ORDER — LANTUS SOLOSTAR 100 UNIT/ML ~~LOC~~ SOPN: 20.0000 [IU] | PEN_INJECTOR | SUBCUTANEOUS | 99 refills | Status: DC

## 2020-05-03 NOTE — Telephone Encounter (Signed)
Patient came back into office after her visit with Dr Loanne Drilling and is requesting  Refill of  Colesevelam 625 MG to be sent [facility] corner of [address] and [address]  Call back # [phone]

## 2020-05-03 NOTE — Progress Notes (Signed)
Subjective:    Patient ID: Krystal Watson, female    DOB: 1946/03/07, 75 y.o.   MRN: [id number]  HPI Pt returns for f/u of diabetes mellitus:  DM type: Insulin-requiring type 2 Dx'ed: 5188  Complications: DN Therapy: insulin since 2021, and 4 oral meds.  GDM: never.  DKA: never.  Severe hypoglycemia: never.  Pancreatitis: never.  Pancreatic imaging: normal on 2010 CT  SDOH: she declined Januvia, Rybelsus, and Onglyza, due to cost.  Other: she declines weight loss surgery: she did not tolerate pioglitazone (edema); she declines multiple daily injections. she did not tolerate Jardiance or bromocriptine (vaginitis) Interval history: No recent steroids.  Pt requests a new meter.  She takes meds as rx'ed.  pt states she feels well in general. Past Medical History:  Diagnosis Date  . Allergy    sulfa  . Arthritis   . Cancer (HCC)    breast  . Chronic pain   . Diabetes mellitus   . Diverticulosis   . Gallstones   . GERD (gastroesophageal reflux disease)   . Hyperlipidemia   . Hypertension   . Obesity   . Shortness of breath    with exertion    Past Surgical History:  Procedure Laterality Date  . ABDOMINAL HYSTERECTOMY    . CHOLECYSTECTOMY    . COLONOSCOPY    . ERCP     Hx 2x  . SIMPLE MASTECTOMY WITH AXILLARY SENTINEL NODE BIOPSY  04/24/2012   Procedure: SIMPLE MASTECTOMY WITH AXILLARY SENTINEL NODE BIOPSY;  Surgeon: Haywood Lasso, MD;  Location: [facility];  Service: General;  Laterality: Bilateral;  Bilateral total mastectomy and bilateral sentinel node    Social History   Socioeconomic History  . Marital status: Married    Spouse name: Not on file  . Number of children: 2  . Years of education: Not on file  . Highest education level: Not on file  Occupational History  . Occupation: Pharmacist, hospital  Tobacco Use  . Smoking status: Never Smoker  . Smokeless tobacco: Never Used  Substance and Sexual Activity  . Alcohol use: No    Comment: rare  . Drug use: No   . Sexual activity: Yes  Other Topics Concern  . Not on file  Social History Narrative  . Not on file   Social Determinants of Health   Financial Resource Strain: Not on file  Food Insecurity: Not on file  Transportation Needs: Not on file  Physical Activity: Not on file  Stress: Not on file  Social Connections: Not on file  Intimate Partner Violence: Not on file    Current Outpatient Medications on File Prior to Visit  Medication Sig Dispense Refill  . acarbose (PRECOSE) 25 MG tablet Take 1 tablet (25 mg total) by mouth 3 (three) times daily with meals. 90 tablet 3  . Alum Hydroxide-Mag Carbonate (GAVISCON PO) Take 2 tablets by mouth daily as needed. For stomach    . aspirin 81 MG tablet Take 81 mg by mouth daily.    [name] atorvastatin (LIPITOR) 40 MG tablet Take 40 mg by mouth daily.    . Calcium Carbonate-Vitamin D (CALTRATE 600+D) 600-400 MG-UNIT tablet Take 1 tablet by mouth 2 (two) times daily. (Patient taking differently: Take 1 tablet by mouth daily.)    . diltiazem (DILACOR XR) 240 MG 24 hr capsule Take 240 mg by mouth daily.    [name] esomeprazole (NEXIUM) 20 MG capsule Take 1 capsule (20 mg total) by mouth daily at 12 noon.    [name]  hydrochlorothiazide (HYDRODIURIL) 25 MG tablet Take 25 mg by mouth daily.    [name] ibandronate (BONIVA) 150 MG tablet Take 150 mg by mouth every 30 (thirty) days. Take in the morning with a full glass of water, on an empty stomach, and do not take anything else by mouth or lie down for the next 30 min.    . Insulin Pen Needle (PEN NEEDLES) 31G X 5 MM MISC Use to inject insulin into skin once daily in the morning. 100 each 12  . Lancets (ONETOUCH DELICA PLUS [id number]) MISC USE AS DIRECTED TO TEST BLOOD SUGAR DAILY 100 each 0  . metFORMIN (GLUCOPHAGE) 500 MG tablet TAKE 1 TABLET(500 MG) BY MOUTH TWICE DAILY WITH A MEAL 180 tablet 3  . Multiple Vitamins-Minerals (ONE-A-DAY WEIGHT SMART ADVANCE) TABS Take 1 tablet by mouth daily.    . phenylephrine (SUDAFED PE) 10  MG TABS Take 10 mg by mouth every 4 (four) hours as needed. For allergies    . PROAIR HFA 108 (90 BASE) MCG/ACT inhaler inhale 2 puffs by mouth every 6 hours for 10 days  0  . ramipril (ALTACE) 10 MG tablet Take 10 mg by mouth daily.    . repaglinide (PRANDIN) 2 MG tablet TAKE 1 TABLET(2 MG) BY MOUTH THREE TIMES DAILY BEFORE MEALS 90 tablet 11  . UNABLE TO FIND Rx: L8000-Post Surgical Bras (Quantity: 6) [id number]- Silicone Breast Prosthesis (Quantity: 2) Dx: 174.9; bilateral mastectomies 1 each 0   No current facility-administered medications on file prior to visit.    Allergies  Allergen Reactions  . Sulfonamide Derivatives     Pt can not remember the reaction    Family History  Problem Relation Age of Onset  . Colon cancer Father 56  . Diabetes Mother   . Heart disease Mother   . Breast cancer Sister   . Esophageal cancer Neg Hx   . Stomach cancer Neg Hx   . Rectal cancer Neg Hx     BP 124/60 (BP Location: Left Arm, Patient Position: Sitting, Cuff Size: Large)   Pulse (!) 58   Ht 5\' 2"  (1.575 m)   Wt 206 lb 9.6 oz (93.7 kg)   SpO2 95%   BMI 37.79 kg/m    Review of Systems     Objective:   Physical Exam VITAL SIGNS:  See vs page GENERAL: no distress Pulses: dorsalis pedis intact bilat.   MSK: no deformity of the feet CV: 1+ bilat leg edema Skin:  no ulcer on the feet.  normal color and temp on the feet. Neuro: sensation is intact to touch on the feet.   Ext: there is bilateral onychomycosis of the toenails.  Both great toenails are absent.   Lab Results  Component Value Date   HGBA1C 9.1 (A) 05/03/2020       Assessment & Plan:  Insulin-requiring type 2 DM: uncontrolled  Patient Instructions  Please increase the Lantus to 20 units each morning. Please continue the same other diabetes medications for now.  Please call or message [place] next week, to tell us how the blood sugar is doing.   Here is a new meter.  I have sent a prescription to your pharmacy, for  strips. check your blood sugar once a day.  vary the time of day when you check, between before the 3 meals, and at bedtime.  also check if you have symptoms of your blood sugar being too high or too low.  please keep a record of the readings  and bring it to your next appointment here (or you can bring the meter itself).  You can write it on any piece of paper.  please call us sooner if your blood sugar goes below 70, or if you have a lot of readings over 200. Please come back for a follow-up appointment in 2 months.

## 2020-05-03 NOTE — Patient Instructions (Addendum)
Please increase the Lantus to 20 units each morning. Please continue the same other diabetes medications for now.  Please call or message [place] next week, to tell us how the blood sugar is doing.   Here is a new meter.  I have sent a prescription to your pharmacy, for strips. check your blood sugar once a day.  vary the time of day when you check, between before the 3 meals, and at bedtime.  also check if you have symptoms of your blood sugar being too high or too low.  please keep a record of the readings and bring it to your next appointment here (or you can bring the meter itself).  You can write it on any piece of paper.  please call us sooner if your blood sugar goes below 70, or if you have a lot of readings over 200. Please come back for a follow-up appointment in 2 months.

## 2020-05-03 NOTE — Telephone Encounter (Signed)
LVM--rx sent to the pharmacy

## 2020-05-08 ENCOUNTER — Other Ambulatory Visit: Payer: Self-pay | Admitting: Endocrinology

## 2020-06-24 ENCOUNTER — Other Ambulatory Visit: Payer: Self-pay

## 2020-06-24 ENCOUNTER — Ambulatory Visit (INDEPENDENT_AMBULATORY_CARE_PROVIDER_SITE_OTHER): Payer: Medicare PPO | Admitting: Endocrinology

## 2020-06-24 VITALS — BP 146/80 | HR 71 | Ht 62.5 in | Wt 207.2 lb

## 2020-06-24 DIAGNOSIS — E1129 Type 2 diabetes mellitus with other diabetic kidney complication: Secondary | ICD-10-CM

## 2020-06-24 DIAGNOSIS — R809 Proteinuria, unspecified: Secondary | ICD-10-CM

## 2020-06-24 DIAGNOSIS — Z20822 Contact with and (suspected) exposure to covid-19: Secondary | ICD-10-CM | POA: Diagnosis not present

## 2020-06-24 LAB — POCT GLYCOSYLATED HEMOGLOBIN (HGB A1C): Hemoglobin A1C: 7.3 % — AB (ref 4.0–5.6)

## 2020-06-24 MED ORDER — REPAGLINIDE 2 MG PO TABS: 2.0000 mg | ORAL_TABLET | Freq: Three times a day (TID) | ORAL | 3 refills | Status: DC

## 2020-06-24 NOTE — Progress Notes (Signed)
Subjective:    Patient ID: Krystal Watson, female    DOB: 1945/05/31, 75 y.o.   MRN: [id number]  HPI Pt returns for f/u of diabetes mellitus:  DM type: Insulin-requiring type 2 Dx'ed: 7412  Complications: DN Therapy: insulin since 2021, and 4 oral meds.  GDM: never.  DKA: never.  Severe hypoglycemia: never.  Pancreatitis: never.  Pancreatic imaging: normal on 2010 CT  SDOH: she declined Januvia, Rybelsus, and Onglyza, due to cost.  Other: she declines weight loss surgery: she did not tolerate pioglitazone (edema); she declines multiple daily injections. she did not tolerate Jardiance or bromocriptine (vaginitis) Interval history: No recent steroids.  she brings a record of her cbg's which I have reviewed today.  cbg varies from 110-199.  She checks fasting only.  She takes meds as rx'ed, except she has not recently taken acarbose.  pt states she feels well in general. Past Medical History:  Diagnosis Date   Allergy    sulfa   Arthritis    Cancer ([place])    breast   Chronic pain    Diabetes mellitus    Diverticulosis    Gallstones    GERD (gastroesophageal reflux disease)    Hyperlipidemia    Hypertension    Obesity    Shortness of breath    with exertion    Past Surgical History:  Procedure Laterality Date   ABDOMINAL HYSTERECTOMY     CHOLECYSTECTOMY     COLONOSCOPY     ERCP     Hx 2x   SIMPLE MASTECTOMY WITH AXILLARY SENTINEL NODE BIOPSY  04/24/2012   Procedure: SIMPLE MASTECTOMY WITH AXILLARY SENTINEL NODE BIOPSY;  Surgeon: Haywood Lasso, MD;  Location: [facility] OR;  Service: General;  Laterality: Bilateral;  Bilateral total mastectomy and bilateral sentinel node    Social History   Socioeconomic History   Marital status: Married    Spouse name: Not on file   Number of children: 2   Years of education: Not on file   Highest education level: Not on file  Occupational History   Occupation: Teacher  Tobacco Use   Smoking status:  Never Smoker   Smokeless tobacco: Never Used  Substance and Sexual Activity   Alcohol use: No    Comment: rare   Drug use: No   Sexual activity: Yes  Other Topics Concern   Not on file  Social History Narrative   Not on file   Social Determinants of Health   Financial Resource Strain: Not on file  Food Insecurity: Not on file  Transportation Needs: Not on file  Physical Activity: Not on file  Stress: Not on file  Social Connections: Not on file  Intimate Partner Violence: Not on file    Current Outpatient Medications on File Prior to Visit  Medication Sig Dispense Refill   Alum Hydroxide-Mag Carbonate (GAVISCON PO) Take 2 tablets by mouth daily as needed. For stomach     aspirin 81 MG tablet Take 81 mg by mouth daily.     atorvastatin (LIPITOR) 40 MG tablet Take 40 mg by mouth daily.     Calcium Carbonate-Vitamin D (CALTRATE 600+D) 600-400 MG-UNIT tablet Take 1 tablet by mouth 2 (two) times daily. (Patient taking differently: Take 1 tablet by mouth daily.)     colesevelam (WELCHOL) 625 MG tablet TAKE 1 TABLET(625 MG) BY MOUTH TWICE DAILY WITH A MEAL 90 tablet 3   diltiazem (DILACOR XR) 240 MG 24 hr capsule Take 240 mg by mouth daily.  esomeprazole (NEXIUM) 20 MG capsule Take 1 capsule (20 mg total) by mouth daily at 12 noon.     glucose blood (ONETOUCH VERIO) test strip 1 each by Other route in the morning and at bedtime. And lancets 2/day 200 strip 3   hydrochlorothiazide (HYDRODIURIL) 25 MG tablet Take 25 mg by mouth daily.     ibandronate (BONIVA) 150 MG tablet Take 150 mg by mouth every 30 (thirty) days. Take in the morning with a full glass of water, on an empty stomach, and do not take anything else by mouth or lie down for the next 30 min.     insulin glargine (LANTUS SOLOSTAR) 100 UNIT/ML Solostar Pen Inject 20 Units into the skin every morning. And pen needles 1/day. 15 mL PRN   Insulin Pen Needle (PEN NEEDLES) 31G X 5 MM MISC Use to inject insulin  into skin once daily in the morning. 100 each 12   Lancets (ONETOUCH DELICA PLUS [id number]) MISC USE AS DIRECTED TO TEST BLOOD SUGAR DAILY 100 each 0   metFORMIN (GLUCOPHAGE) 500 MG tablet TAKE 1 TABLET(500 MG) BY MOUTH TWICE DAILY WITH A MEAL 180 tablet 3   Multiple Vitamins-Minerals (ONE-A-DAY WEIGHT SMART ADVANCE) TABS Take 1 tablet by mouth daily.     phenylephrine (SUDAFED PE) 10 MG TABS Take 10 mg by mouth every 4 (four) hours as needed. For allergies     PROAIR HFA 108 (90 BASE) MCG/ACT inhaler inhale 2 puffs by mouth every 6 hours for 10 days  0   ramipril (ALTACE) 10 MG tablet Take 10 mg by mouth daily.     UNABLE TO FIND Rx: L8000-Post Surgical Bras (Quantity: 6) H4742- Silicone Breast Prosthesis (Quantity: 2) Dx: 174.9; bilateral mastectomies 1 each 0   No current facility-administered medications on file prior to visit.    Allergies  Allergen Reactions   Sulfonamide Derivatives     Pt can not remember the reaction    Family History  Problem Relation Age of Onset   Colon cancer Father 54   Diabetes Mother    Heart disease Mother    Breast cancer Sister    Esophageal cancer Neg Hx    Stomach cancer Neg Hx    Rectal cancer Neg Hx     BP (!) 146/80 (BP Location: Right Arm, Patient Position: Sitting, Cuff Size: Normal)    Pulse 71    Ht 5' 2.5" (1.588 m)    Wt 207 lb 3.2 oz (94 kg)    SpO2 94%    BMI 37.29 kg/m    Review of Systems     Objective:   Physical Exam VITAL SIGNS:  See vs page GENERAL: no distress Pulses: dorsalis pedis intact bilat.   MSK: no deformity of the feet CV: no leg edema Skin:  no ulcer on the feet.  normal color and temp on the feet. Neuro: sensation is intact to touch on the feet   A1c=7.3%    Assessment & Plan:  Type 2 DM: uncontrolled.  Pt declines to increase rx  Patient Instructions  Please continue the same 4 diabetes medications.  check your blood sugar once a day.  vary the time of day when you check, between  before the 3 meals, and at bedtime.  also check if you have symptoms of your blood sugar being too high or too low.  please keep a record of the readings and bring it to your next appointment here (or you can bring the meter itself).  You can write it on any piece of paper.  please call us sooner if your blood sugar goes below 70, or if you have a lot of readings over 200.  Please come back for a follow-up appointment in 4 months.

## 2020-06-24 NOTE — Patient Instructions (Signed)
Please continue the same 4 diabetes medications.  check your blood sugar once a day.  vary the time of day when you check, between before the 3 meals, and at bedtime.  also check if you have symptoms of your blood sugar being too high or too low.  please keep a record of the readings and bring it to your next appointment here (or you can bring the meter itself).  You can write it on any piece of paper.  please call us sooner if your blood sugar goes below 70, or if you have a lot of readings over 200.  Please come back for a follow-up appointment in 4 months.

## 2020-07-27 DIAGNOSIS — K219 Gastro-esophageal reflux disease without esophagitis: Secondary | ICD-10-CM | POA: Diagnosis not present

## 2020-08-23 DIAGNOSIS — M810 Age-related osteoporosis without current pathological fracture: Secondary | ICD-10-CM | POA: Diagnosis not present

## 2020-08-23 DIAGNOSIS — E0822 Diabetes mellitus due to underlying condition with diabetic chronic kidney disease: Secondary | ICD-10-CM | POA: Diagnosis not present

## 2020-08-23 DIAGNOSIS — K219 Gastro-esophageal reflux disease without esophagitis: Secondary | ICD-10-CM | POA: Diagnosis not present

## 2020-08-23 DIAGNOSIS — Z7984 Long term (current) use of oral hypoglycemic drugs: Secondary | ICD-10-CM | POA: Diagnosis not present

## 2020-08-23 DIAGNOSIS — E7800 Pure hypercholesterolemia, unspecified: Secondary | ICD-10-CM | POA: Diagnosis not present

## 2020-08-23 DIAGNOSIS — Z0000 Encounter for general adult medical examination without abnormal findings: Secondary | ICD-10-CM | POA: Diagnosis not present

## 2020-08-23 DIAGNOSIS — N1831 Chronic kidney disease, stage 3a: Secondary | ICD-10-CM | POA: Diagnosis not present

## 2020-08-23 DIAGNOSIS — I10 Essential (primary) hypertension: Secondary | ICD-10-CM | POA: Diagnosis not present

## 2020-08-25 DIAGNOSIS — H02831 Dermatochalasis of right upper eyelid: Secondary | ICD-10-CM | POA: Diagnosis not present

## 2020-08-25 DIAGNOSIS — H5203 Hypermetropia, bilateral: Secondary | ICD-10-CM | POA: Diagnosis not present

## 2020-08-25 DIAGNOSIS — E119 Type 2 diabetes mellitus without complications: Secondary | ICD-10-CM | POA: Diagnosis not present

## 2020-08-25 DIAGNOSIS — H35372 Puckering of macula, left eye: Secondary | ICD-10-CM | POA: Diagnosis not present

## 2020-08-25 LAB — HM DIABETES EYE EXAM

## 2020-08-26 ENCOUNTER — Telehealth: Payer: Self-pay | Admitting: Endocrinology

## 2020-08-26 MED ORDER — LANTUS SOLOSTAR 100 UNIT/ML ~~LOC~~ SOPN: 20.0000 [IU] | PEN_INJECTOR | SUBCUTANEOUS | 0 refills | Status: DC

## 2020-08-26 NOTE — Telephone Encounter (Signed)
Patient requesting an updated RX for Lantus be sent to [facility] on [address]. She takes 20 units each AM

## 2020-08-26 NOTE — Telephone Encounter (Signed)
Refill as requested 

## 2020-08-28 DIAGNOSIS — Z20822 Contact with and (suspected) exposure to covid-19: Secondary | ICD-10-CM | POA: Diagnosis not present

## 2020-08-28 DIAGNOSIS — R059 Cough, unspecified: Secondary | ICD-10-CM | POA: Diagnosis not present

## 2020-09-01 ENCOUNTER — Telehealth: Payer: Self-pay | Admitting: Endocrinology

## 2020-09-01 DIAGNOSIS — Z20822 Contact with and (suspected) exposure to covid-19: Secondary | ICD-10-CM | POA: Diagnosis not present

## 2020-09-01 NOTE — Telephone Encounter (Signed)
Patient DX with COVID - is now experiencing High Blood Sugars. Was given a steroid for COVID.  Is requesting a call back to [phone] with assistance regarding high blood sugars.

## 2020-09-01 NOTE — Telephone Encounter (Signed)
F/u   Patient is asking for a call back today to discuss high blood sugars

## 2020-09-02 NOTE — Telephone Encounter (Signed)
Increase to 50 units qam. Effect of steroid can last up to a week after it is done.  Taper back to usual dosage.  Call if questions

## 2020-09-02 NOTE — Telephone Encounter (Signed)
Message sent thru MyChart 

## 2020-09-02 NOTE — Telephone Encounter (Addendum)
Spoke with pt and she stated that she has been having some high BS reading. Yesterday, it was running 330 and she took 20u. The other 2 days was 240 and 274 and she has been taking thew 20u as well.  Please Advise

## 2020-09-07 DIAGNOSIS — Z20822 Contact with and (suspected) exposure to covid-19: Secondary | ICD-10-CM | POA: Diagnosis not present

## 2020-09-14 DIAGNOSIS — Z20822 Contact with and (suspected) exposure to covid-19: Secondary | ICD-10-CM | POA: Diagnosis not present

## 2020-09-16 ENCOUNTER — Encounter (HOSPITAL_COMMUNITY): Payer: Self-pay

## 2020-09-16 ENCOUNTER — Observation Stay (HOSPITAL_COMMUNITY): Payer: Medicare PPO

## 2020-09-16 ENCOUNTER — Other Ambulatory Visit: Payer: Self-pay

## 2020-09-16 ENCOUNTER — Emergency Department (HOSPITAL_COMMUNITY): Payer: Medicare PPO

## 2020-09-16 ENCOUNTER — Observation Stay (HOSPITAL_COMMUNITY)
Admission: EM | Admit: 2020-09-16 | Discharge: 2020-09-17 | Disposition: A | Discharge status: Home / Self Care | Payer: Medicare PPO | Attending: Internal Medicine | Admitting: Internal Medicine

## 2020-09-16 DIAGNOSIS — R7401 Elevation of levels of liver transaminase levels: Secondary | ICD-10-CM

## 2020-09-16 DIAGNOSIS — R1013 Epigastric pain: Secondary | ICD-10-CM | POA: Diagnosis not present

## 2020-09-16 DIAGNOSIS — R1111 Vomiting without nausea: Secondary | ICD-10-CM | POA: Diagnosis not present

## 2020-09-16 DIAGNOSIS — U071 COVID-19: Secondary | ICD-10-CM | POA: Insufficient documentation

## 2020-09-16 DIAGNOSIS — Z79899 Other long term (current) drug therapy: Secondary | ICD-10-CM | POA: Insufficient documentation

## 2020-09-16 DIAGNOSIS — Z7984 Long term (current) use of oral hypoglycemic drugs: Secondary | ICD-10-CM | POA: Insufficient documentation

## 2020-09-16 DIAGNOSIS — R1084 Generalized abdominal pain: Secondary | ICD-10-CM | POA: Diagnosis not present

## 2020-09-16 DIAGNOSIS — E119 Type 2 diabetes mellitus without complications: Secondary | ICD-10-CM | POA: Diagnosis not present

## 2020-09-16 DIAGNOSIS — E1169 Type 2 diabetes mellitus with other specified complication: Secondary | ICD-10-CM | POA: Diagnosis not present

## 2020-09-16 DIAGNOSIS — Z7982 Long term (current) use of aspirin: Secondary | ICD-10-CM | POA: Diagnosis not present

## 2020-09-16 DIAGNOSIS — R52 Pain, unspecified: Secondary | ICD-10-CM

## 2020-09-16 DIAGNOSIS — K8050 Calculus of bile duct without cholangitis or cholecystitis without obstruction: Secondary | ICD-10-CM | POA: Diagnosis not present

## 2020-09-16 DIAGNOSIS — I10 Essential (primary) hypertension: Secondary | ICD-10-CM | POA: Diagnosis not present

## 2020-09-16 DIAGNOSIS — R109 Unspecified abdominal pain: Principal | ICD-10-CM | POA: Insufficient documentation

## 2020-09-16 DIAGNOSIS — E785 Hyperlipidemia, unspecified: Secondary | ICD-10-CM | POA: Diagnosis not present

## 2020-09-16 DIAGNOSIS — Z794 Long term (current) use of insulin: Secondary | ICD-10-CM | POA: Insufficient documentation

## 2020-09-16 DIAGNOSIS — R7989 Other specified abnormal findings of blood chemistry: Secondary | ICD-10-CM | POA: Diagnosis not present

## 2020-09-16 DIAGNOSIS — Z853 Personal history of malignant neoplasm of breast: Secondary | ICD-10-CM | POA: Insufficient documentation

## 2020-09-16 DIAGNOSIS — N281 Cyst of kidney, acquired: Secondary | ICD-10-CM | POA: Diagnosis not present

## 2020-09-16 DIAGNOSIS — K838 Other specified diseases of biliary tract: Secondary | ICD-10-CM | POA: Diagnosis not present

## 2020-09-16 DIAGNOSIS — R935 Abnormal findings on diagnostic imaging of other abdominal regions, including retroperitoneum: Secondary | ICD-10-CM | POA: Diagnosis not present

## 2020-09-16 LAB — URINALYSIS, ROUTINE W REFLEX MICROSCOPIC
Glucose, UA: 1000 mg/dL — AB
Hgb urine dipstick: NEGATIVE
Ketones, ur: NEGATIVE mg/dL
Leukocytes,Ua: NEGATIVE
Nitrite: POSITIVE — AB
Protein, ur: 100 mg/dL — AB
Specific Gravity, Urine: 1.025 (ref 1.005–1.030)
pH: 7 (ref 5.0–8.0)

## 2020-09-16 LAB — COMPREHENSIVE METABOLIC PANEL
ALT: 747 U/L — ABNORMAL HIGH (ref 0–44)
AST: 1209 U/L — ABNORMAL HIGH (ref 15–41)
Albumin: 3.9 g/dL (ref 3.5–5.0)
Alkaline Phosphatase: 463 U/L — ABNORMAL HIGH (ref 38–126)
Anion gap: 11 (ref 5–15)
BUN: 11 mg/dL (ref 8–23)
CO2: 28 mmol/L (ref 22–32)
Calcium: 9.4 mg/dL (ref 8.9–10.3)
Chloride: 96 mmol/L — ABNORMAL LOW (ref 98–111)
Creatinine, Ser: 0.54 mg/dL (ref 0.44–1.00)
GFR, Estimated: >60 mL/min (ref >60)
Glucose, Bld: 204 mg/dL — ABNORMAL HIGH (ref 70–99)
Potassium: 3.7 mmol/L (ref 3.5–5.1)
Sodium: 135 mmol/L (ref 135–145)
Total Bilirubin: 3.1 mg/dL — ABNORMAL HIGH (ref 0.3–1.2)
Total Protein: 7.7 g/dL (ref 6.5–8.1)

## 2020-09-16 LAB — GLUCOSE, CAPILLARY: Glucose-Capillary: 134 mg/dL — ABNORMAL HIGH (ref 70–99)

## 2020-09-16 LAB — CBC
HCT: 44.9 % (ref 36.0–46.0)
Hemoglobin: 14.3 g/dL (ref 12.0–15.0)
MCH: 30.8 pg (ref 26.0–34.0)
MCHC: 31.8 g/dL (ref 30.0–36.0)
MCV: 96.8 fL (ref 80.0–100.0)
Platelets: 229 10*3/uL (ref 150–400)
RBC: 4.64 MIL/uL (ref 3.87–5.11)
RDW: 13.2 % (ref 11.5–15.5)
WBC: 13.3 10*3/uL — ABNORMAL HIGH (ref 4.0–10.5)
nRBC: 0 % (ref 0.0–0.2)

## 2020-09-16 LAB — LIPASE, BLOOD: Lipase: 28 U/L (ref 11–51)

## 2020-09-16 MED ORDER — SODIUM CHLORIDE 0.9 % IV SOLN
1.0000 g | Freq: Once | INTRAVENOUS | Status: AC
  Administered 2020-09-16: 1 g via INTRAVENOUS
  Filled 2020-09-16: qty 10

## 2020-09-16 MED ORDER — INSULIN ASPART 100 UNIT/ML IJ SOLN
0.0000 [IU] | Freq: Every day | INTRAMUSCULAR | Status: DC
  Filled 2020-09-16: qty 0.05

## 2020-09-16 MED ORDER — INSULIN GLARGINE 100 UNIT/ML ~~LOC~~ SOLN
10.0000 [IU] | Freq: Every day | SUBCUTANEOUS | Status: DC
  Administered 2020-09-16: 10 [IU] via SUBCUTANEOUS
  Filled 2020-09-16: qty 0.1

## 2020-09-16 MED ORDER — SODIUM CHLORIDE 0.9 % IV BOLUS
500.0000 mL | Freq: Once | INTRAVENOUS | Status: AC
  Administered 2020-09-16: 500 mL via INTRAVENOUS

## 2020-09-16 MED ORDER — OXYCODONE HCL 5 MG PO TABS: 5.0000 mg | ORAL_TABLET | ORAL | Status: DC | PRN

## 2020-09-16 MED ORDER — ONDANSETRON HCL 4 MG PO TABS: 4.0000 mg | ORAL_TABLET | Freq: Four times a day (QID) | ORAL | Status: DC | PRN

## 2020-09-16 MED ORDER — FENTANYL CITRATE (PF) 100 MCG/2ML IJ SOLN
50.0000 ug | Freq: Once | INTRAMUSCULAR | Status: AC
  Administered 2020-09-16: 50 ug via INTRAVENOUS
  Filled 2020-09-16: qty 2

## 2020-09-16 MED ORDER — SODIUM CHLORIDE 0.9 % IV SOLN: INTRAVENOUS | Status: DC

## 2020-09-16 MED ORDER — IOHEXOL 300 MG/ML  SOLN
100.0000 mL | Freq: Once | INTRAMUSCULAR | Status: AC | PRN
  Administered 2020-09-16: 100 mL via INTRAVENOUS

## 2020-09-16 MED ORDER — MORPHINE SULFATE (PF) 2 MG/ML IV SOLN: 2.0000 mg | INTRAVENOUS | Status: DC | PRN

## 2020-09-16 MED ORDER — ONDANSETRON HCL 4 MG/2ML IJ SOLN: 4.0000 mg | Freq: Four times a day (QID) | INTRAMUSCULAR | Status: DC | PRN

## 2020-09-16 MED ORDER — ACETAMINOPHEN 650 MG RE SUPP: 650.0000 mg | Freq: Four times a day (QID) | RECTAL | Status: DC | PRN

## 2020-09-16 MED ORDER — SODIUM CHLORIDE 0.9 % IV SOLN
2.0000 g | INTRAVENOUS | Status: DC
  Filled 2020-09-16: qty 20

## 2020-09-16 MED ORDER — ONDANSETRON HCL 4 MG/2ML IJ SOLN
4.0000 mg | Freq: Once | INTRAMUSCULAR | Status: AC
  Administered 2020-09-16: 4 mg via INTRAVENOUS
  Filled 2020-09-16: qty 2

## 2020-09-16 MED ORDER — GADOBUTROL 1 MMOL/ML IV SOLN
10.0000 mL | Freq: Once | INTRAVENOUS | Status: AC | PRN
  Administered 2020-09-16: 10 mL via INTRAVENOUS

## 2020-09-16 MED ORDER — ALBUTEROL SULFATE (2.5 MG/3ML) 0.083% IN NEBU: 2.5000 mg | INHALATION_SOLUTION | Freq: Four times a day (QID) | RESPIRATORY_TRACT | Status: DC | PRN

## 2020-09-16 MED ORDER — DILTIAZEM HCL ER 240 MG PO CP24
240.0000 mg | ORAL_CAPSULE | Freq: Every day | ORAL | Status: DC
  Filled 2020-09-16: qty 1

## 2020-09-16 MED ORDER — INSULIN ASPART 100 UNIT/ML IJ SOLN
0.0000 [IU] | Freq: Three times a day (TID) | INTRAMUSCULAR | Status: DC
  Administered 2020-09-17: 2 [IU] via SUBCUTANEOUS
  Administered 2020-09-17: 3 [IU] via SUBCUTANEOUS
  Filled 2020-09-16: qty 0.15

## 2020-09-16 MED ORDER — ACETAMINOPHEN 325 MG PO TABS: 650.0000 mg | ORAL_TABLET | Freq: Four times a day (QID) | ORAL | Status: DC | PRN

## 2020-09-16 NOTE — ED Provider Notes (Signed)
Received this patient as a handoff at shift change from [place] Robinson, PA-C.  In short, patient is a 75 year old female with PMH of type II DM, HTN, HLD, breast cancer, and s/p cholecystectomy who presented to the ED with 1 day history of sharp right upper quadrant abdominal pain with associated nausea.  Laboratory work-up was notable for new transaminitis with AST of 1200 and ALT of 750.  She also has elevated alkaline phosphatase and T bili.  Renal function is preserved.  Leukocytosis at 13.3 and CT abdomen demonstrates dilated common bile duct 11 mm.  Pancreas unremarkable.  [facility] was consulted and they recommend MRCP and likely medicine to admit, but first to confirm with attending.  IV Rocephin was ordered for nitrate positive bacteria as well as to help cover empirically for abdominal infection.  She had denied any fevers or chills at home.   Physical Exam  BP 136/77 (BP Location: Left Arm)   Pulse 82   Temp 98 F (36.7 C) (Oral)   Resp 16   SpO2 96%   Physical Exam Vitals and nursing note reviewed. Exam conducted with a chaperone present.  Constitutional:      Appearance: Normal appearance.  HENT:     Head: Normocephalic and atraumatic.  Eyes:     General: No scleral icterus.    Conjunctiva/sclera: Conjunctivae normal.  Cardiovascular:     Rate and Rhythm: Normal rate.  Pulmonary:     Effort: Pulmonary effort is normal.  Abdominal:     Tenderness: There is abdominal tenderness.  Skin:    General: Skin is dry.  Neurological:     Mental Status: She is alert.     GCS: GCS eye subscore is 4. GCS verbal subscore is 5. GCS motor subscore is 6.  Psychiatric:        Mood and Affect: Mood normal.        Behavior: Behavior normal.        Thought Content: Thought content normal.    ED Course/Procedures   Clinical Course as of 09/16/20 1730  Thu Sep 16, 2020  1729 Consulted with hospitalist, Dr. Starla Link, who will see and admit patient. [GG]    Clinical Course User  Index [GG] Corena Herter, PA-C    Procedures Results for orders placed or performed during the hospital encounter of 09/16/20  CBC  Result Value Ref Range   WBC 13.3 (H) 4.0 - 10.5 K/uL   RBC 4.64 3.87 - 5.11 MIL/uL   Hemoglobin 14.3 12.0 - 15.0 g/dL   HCT 44.9 36.0 - 46.0 %   MCV 96.8 80.0 - 100.0 fL   MCH 30.8 26.0 - 34.0 pg   MCHC 31.8 30.0 - 36.0 g/dL   RDW 13.2 11.5 - 15.5 %   Platelets 229 150 - 400 K/uL   nRBC 0.0 0.0 - 0.2 %  Urinalysis, Routine w reflex microscopic Urine, Clean Catch  Result Value Ref Range   Color, Urine YELLOW (A) YELLOW   APPearance HAZY (A) CLEAR   Specific Gravity, Urine 1.025 1.005 - 1.030   pH 7.0 5.0 - 8.0   Glucose, UA 1,000 (A) NEGATIVE mg/dL   Hgb urine dipstick NEGATIVE NEGATIVE   Bilirubin Urine SMALL (A) NEGATIVE   Ketones, ur NEGATIVE NEGATIVE mg/dL   Protein, ur 100 (A) NEGATIVE mg/dL   Nitrite POSITIVE (A) NEGATIVE   Leukocytes,Ua NEGATIVE NEGATIVE   RBC / HPF 0-5 0 - 5 RBC/hpf   WBC, UA 0-5 0 - 5 WBC/hpf  Bacteria, UA MANY (A) NONE SEEN   Squamous Epithelial / LPF 0-5 0 - 5   Mucus PRESENT   Comprehensive metabolic panel  Result Value Ref Range   Sodium 135 135 - 145 mmol/L   Potassium 3.7 3.5 - 5.1 mmol/L   Chloride 96 (L) 98 - 111 mmol/L   CO2 28 22 - 32 mmol/L   Glucose, Bld 204 (H) 70 - 99 mg/dL   BUN 11 8 - 23 mg/dL   Creatinine, Ser 0.54 0.44 - 1.00 mg/dL   Calcium 9.4 8.9 - 10.3 mg/dL   Total Protein 7.7 6.5 - 8.1 g/dL   Albumin 3.9 3.5 - 5.0 g/dL   AST 1,209 (H) 15 - 41 U/L   ALT 747 (H) 0 - 44 U/L   Alkaline Phosphatase 463 (H) 38 - 126 U/L   Total Bilirubin 3.1 (H) 0.3 - 1.2 mg/dL   GFR, Estimated >60 >60 mL/min   Anion gap 11 5 - 15  Lipase, blood  Result Value Ref Range   Lipase 28 11 - 51 U/L   CT Abdomen Pelvis W Contrast  Result Date: 09/16/2020 CLINICAL DATA:  RIGHT-side abdominal pain, sharp pain radiating to both flanks, constipation EXAM: CT ABDOMEN AND PELVIS WITH CONTRAST TECHNIQUE:  Multidetector CT imaging of the abdomen and pelvis was performed using the standard protocol following bolus administration of intravenous contrast. Sagittal and coronal MPR images reconstructed from axial data set. CONTRAST:  172mL OMNIPAQUE IOHEXOL 300 MG/ML SOLN IV. No oral contrast. COMPARISON:  09/20/2005 FINDINGS: Lower chest: Bibasilar atelectasis Hepatobiliary: Gallbladder surgically absent. Liver unremarkable. Mild intrahepatic and extrahepatic biliary dilatation, CBD up to 11 mm diameter Pancreas: Normal appearance Spleen: Normal appearance Adrenals/Urinary Tract: Two small RIGHT renal cysts, larger at inferior pole 3.0 cm greatest size. Adrenal glands, kidneys, ureters, and bladder otherwise normal appearance Stomach/Bowel: Normal appendix. Scattered normal stool burden. Sigmoid diverticulosis without evidence of diverticulitis. Stomach decompressed. Remaining bowel loops unremarkable. Vascular/Lymphatic: Vascular structures patent. Aorta normal caliber with scattered atherosclerotic calcifications of aorta, iliac arteries and visceral arteries. No adenopathy. Scattered pelvic phleboliths. Reproductive: Uterus surgically absent.  Atrophic ovaries. Other: No free air or free fluid. No hernia or inflammatory process. Musculoskeletal: Facet degenerative changes lumbar spine. Bones demineralized. IMPRESSION: Sigmoid diverticulosis without evidence of diverticulitis. Mild intrahepatic and extrahepatic biliary dilatation post cholecystectomy, recommend correlation with LFTs. Small RIGHT renal cysts. No acute intra-abdominal or intrapelvic abnormalities otherwise identified. Aortic Atherosclerosis (ICD10-I70.0). Electronically Signed   By: Lavonia Dana M.D.   On: 09/16/2020 14:30    MDM   MRCP already been ordered.  GI recommending hospitalist admit.  We will consult hospitalist for admission.  GI PA Berniece Pap wrote a note saying that patient will need to be held n.p.o. until MRI/MRCP with and without  contrast.  Fluids and pain management per hospitalist.  We will consult hospitalist for admission.  Consulted with hospitalist, Dr. Starla Link, who will see and admit patient.     Corena Herter, PA-C 09/16/20 1730    Sherwood Gambler, MD 09/16/20 2021

## 2020-09-16 NOTE — ED Provider Notes (Signed)
[name] DEPT Provider Note   CSN: [id number] Arrival date & time: 09/16/20  1029     History Chief Complaint  Patient presents with   Constipation   Abdominal Pain    Krystal Watson is a 75 y.o. female past medical history of type 2 diabetes, status postcholecystectomy, hypertension, hyperlipidemia, breast cancer.  Patient is presenting for evaluation of right-sided abdominal pain that began yesterday.  Pain is intermittent sharp, and radiates to the center of her back.  Developed some nausea upon arrival to the ED.  She does feel constipated which is somewhat new for her.  She is not having diarrhea or urinary symptoms.  No alcohol use.  Denies feelings of heartburn or indigestion.  No fevers or chills. The history is provided by the patient.      Past Medical History:  Diagnosis Date   Allergy    sulfa   Arthritis    Cancer ([place])    breast   Chronic pain    Diabetes mellitus    Diverticulosis    Gallstones    GERD (gastroesophageal reflux disease)    Hyperlipidemia    Hypertension    Obesity    Shortness of breath    with exertion    Patient Active Problem List   Diagnosis Date Noted   Diabetes ([place]) 03/10/2017   Breast cancer, right breast, UOQ, DCIS 03/15/2012   OBESITY 12/11/2008   GERD 12/11/2008   CONSTIPATION 12/11/2008   CHOLEDOCHOLITHIASIS 12/11/2008   ABDOMINAL PAIN-RUQ 12/11/2008    Past Surgical History:  Procedure Laterality Date   ABDOMINAL HYSTERECTOMY     CHOLECYSTECTOMY     COLONOSCOPY     ERCP     Hx 2x   SIMPLE MASTECTOMY WITH AXILLARY SENTINEL NODE BIOPSY  04/24/2012   Procedure: SIMPLE MASTECTOMY WITH AXILLARY SENTINEL NODE BIOPSY;  Surgeon: Haywood Lasso, MD;  Location: [facility] OR;  Service: General;  Laterality: Bilateral;  Bilateral total mastectomy and bilateral sentinel node     OB History     Gravida  2   Para  2   Term      Preterm      AB      Living         SAB      IAB       Ectopic      Multiple      Live Births           Obstetric Comments  meses age 68, first preg age 76         Family History  Problem Relation Age of Onset   Colon cancer Father 37   Diabetes Mother    Heart disease Mother    Breast cancer Sister    Esophageal cancer Neg Hx    Stomach cancer Neg Hx    Rectal cancer Neg Hx     Social History   Tobacco Use   Smoking status: Never   Smokeless tobacco: Never  Substance Use Topics   Alcohol use: No    Comment: rare   Drug use: No    Home Medications Prior to Admission medications   Medication Sig Start Date End Date Taking? Authorizing Provider  Alum Hydroxide-Mag Carbonate (GAVISCON PO) Take 2 tablets by mouth daily as needed (stomach discomfort). For stomach   Yes [provider]  aspirin 81 MG tablet Take 81 mg by mouth daily.   Yes [provider]  atorvastatin (LIPITOR) 40 MG  tablet Take 40 mg by mouth daily.   Yes [provider]  Calcium Carbonate-Vitamin D (CALTRATE 600+D) 600-400 MG-UNIT tablet Take 1 tablet by mouth 2 (two) times daily. Patient taking differently: Take 1 tablet by mouth daily. 10/27/16  Yes Nicholas Lose, MD  colesevelam (WELCHOL) 625 MG tablet TAKE 1 TABLET(625 MG) BY MOUTH TWICE DAILY WITH A MEAL Patient taking differently: Take 625 mg by mouth 2 (two) times daily with a meal. 05/03/20  Yes Renato Shin, MD  diltiazem (DILACOR XR) 240 MG 24 hr capsule Take 240 mg by mouth daily.   Yes [provider]  esomeprazole (NEXIUM) 20 MG capsule Take 1 capsule (20 mg total) by mouth daily at 12 noon. Patient taking differently: Take 20 mg by mouth every other day. 10/27/16  Yes Nicholas Lose, MD  hydrochlorothiazide (HYDRODIURIL) 25 MG tablet Take 25 mg by mouth daily.   Yes [provider]  ibandronate (BONIVA) 150 MG tablet Take 150 mg by mouth every 30 (thirty) days. Take in the morning with a full glass of water, on an empty stomach, and do not take  anything else by mouth or lie down for the next 30 min.   Yes [provider]  insulin glargine (LANTUS SOLOSTAR) 100 UNIT/ML Solostar Pen Inject 20 Units into the skin every morning. And pen needles 1/day. 08/26/20  Yes Renato Shin, MD  metFORMIN (GLUCOPHAGE) 500 MG tablet TAKE 1 TABLET(500 MG) BY MOUTH TWICE DAILY WITH A MEAL Patient taking differently: Take 500 mg by mouth 2 (two) times daily with a meal. 05/08/20  Yes Renato Shin, MD  Multiple Vitamins-Minerals (ONE-A-DAY WEIGHT SMART ADVANCE) TABS Take 1 tablet by mouth daily.   Yes [provider]  phenylephrine (SUDAFED PE) 10 MG TABS Take 10 mg by mouth every 4 (four) hours as needed. For allergies   Yes [provider]  PROAIR HFA 108 (90 BASE) MCG/ACT inhaler Inhale 2 puffs into the lungs every 6 (six) hours as needed for wheezing or shortness of breath. 09/16/14  Yes [provider]  promethazine-dextromethorphan (PROMETHAZINE-DM) 6.25-15 MG/5ML syrup Take 5 mLs by mouth every 6 (six) hours as needed for cough. 08/28/20  Yes [provider]  ramipril (ALTACE) 10 MG capsule Take 10 mg by mouth daily.   Yes [provider]  repaglinide (PRANDIN) 2 MG tablet Take 1 tablet (2 mg total) by mouth 3 (three) times daily before meals. 06/24/20  Yes Renato Shin, MD  glucose blood (ONETOUCH VERIO) test strip 1 each by Other route in the morning and at bedtime. And lancets 2/day 05/03/20   Renato Shin, MD  Insulin Pen Needle (PEN NEEDLES) 31G X 5 MM MISC Use to inject insulin into skin once daily in the morning. 02/24/20   Renato Shin, MD  Lancets (ONETOUCH DELICA PLUS [id number]) [place] USE AS DIRECTED TO TEST BLOOD SUGAR DAILY 09/23/19   Renato Shin, MD  UNABLE TO FIND Rx: L8000-Post Surgical Bras (Quantity: 6) X7262- Silicone Breast Prosthesis (Quantity: 2) Dx: 174.9; bilateral mastectomies 10/23/13   Erroll Luna, MD    Allergies    Levofloxacin and Sulfonamide derivatives  Review of  Systems   Review of Systems  Gastrointestinal:  Positive for abdominal pain, constipation and nausea.  All other systems reviewed and are negative.  Physical Exam Updated Vital Signs BP 135/74 (BP Location: Left Arm)   Pulse 81   Temp 98 F (36.7 C) (Oral)   Resp 18   SpO2 98%   Physical Exam Vitals  and nursing note reviewed.  Constitutional:      General: She is not in acute distress.    Appearance: She is well-developed.  HENT:     Head: Normocephalic and atraumatic.  Eyes:     Conjunctiva/sclera: Conjunctivae normal.  Cardiovascular:     Rate and Rhythm: Normal rate and regular rhythm.  Pulmonary:     Effort: Pulmonary effort is normal.     Breath sounds: Normal breath sounds.  Abdominal:     General: Bowel sounds are decreased.     Palpations: Abdomen is soft.     Tenderness: There is abdominal tenderness. There is no guarding or rebound.    Skin:    General: Skin is warm.  Neurological:     Mental Status: She is alert.  Psychiatric:        Behavior: Behavior normal.    ED Results / Procedures / Treatments   Labs (all labs ordered are listed, but only abnormal results are displayed) Labs Reviewed  CBC - Abnormal; Notable for the following components:      Result Value   WBC 13.3 (*)    All other components within normal limits  URINALYSIS, ROUTINE W REFLEX MICROSCOPIC - Abnormal; Notable for the following components:   Color, Urine YELLOW (*)    APPearance HAZY (*)    Glucose, UA 1,000 (*)    Bilirubin Urine SMALL (*)    Protein, ur 100 (*)    Nitrite POSITIVE (*)    Bacteria, UA MANY (*)    All other components within normal limits  COMPREHENSIVE METABOLIC PANEL - Abnormal; Notable for the following components:   Chloride 96 (*)    Glucose, Bld 204 (*)    AST 1,209 (*)    ALT 747 (*)    Alkaline Phosphatase 463 (*)    Total Bilirubin 3.1 (*)    All other components within normal limits  URINE CULTURE  LIPASE, BLOOD  HEPATITIS PANEL, ACUTE     EKG None  Radiology CT Abdomen Pelvis W Contrast  Result Date: 09/16/2020 CLINICAL DATA:  RIGHT-side abdominal pain, sharp pain radiating to both flanks, constipation EXAM: CT ABDOMEN AND PELVIS WITH CONTRAST TECHNIQUE: Multidetector CT imaging of the abdomen and pelvis was performed using the standard protocol following bolus administration of intravenous contrast. Sagittal and coronal MPR images reconstructed from axial data set. CONTRAST:  140m OMNIPAQUE IOHEXOL 300 MG/ML SOLN IV. No oral contrast. COMPARISON:  09/20/2005 FINDINGS: Lower chest: Bibasilar atelectasis Hepatobiliary: Gallbladder surgically absent. Liver unremarkable. Mild intrahepatic and extrahepatic biliary dilatation, CBD up to 11 mm diameter Pancreas: Normal appearance Spleen: Normal appearance Adrenals/Urinary Tract: Two small RIGHT renal cysts, larger at inferior pole 3.0 cm greatest size. Adrenal glands, kidneys, ureters, and bladder otherwise normal appearance Stomach/Bowel: Normal appendix. Scattered normal stool burden. Sigmoid diverticulosis without evidence of diverticulitis. Stomach decompressed. Remaining bowel loops unremarkable. Vascular/Lymphatic: Vascular structures patent. Aorta normal caliber with scattered atherosclerotic calcifications of aorta, iliac arteries and visceral arteries. No adenopathy. Scattered pelvic phleboliths. Reproductive: Uterus surgically absent.  Atrophic ovaries. Other: No free air or free fluid. No hernia or inflammatory process. Musculoskeletal: Facet degenerative changes lumbar spine. Bones demineralized. IMPRESSION: Sigmoid diverticulosis without evidence of diverticulitis. Mild intrahepatic and extrahepatic biliary dilatation post cholecystectomy, recommend correlation with LFTs. Small RIGHT renal cysts. No acute intra-abdominal or intrapelvic abnormalities otherwise identified. Aortic Atherosclerosis (ICD10-I70.0). Electronically Signed   By: MLavonia DanaM.D.   On: 09/16/2020 14:30     Procedures Procedures   Medications Ordered  in ED Medications  sodium chloride 0.9 % bolus 500 mL (0 mLs Intravenous Stopped 09/16/20 1305)  ondansetron (ZOFRAN) injection 4 mg (4 mg Intravenous Given 09/16/20 1119)  fentaNYL (SUBLIMAZE) injection 50 mcg (50 mcg Intravenous Given 09/16/20 1119)  fentaNYL (SUBLIMAZE) injection 50 mcg (50 mcg Intravenous Given 09/16/20 1335)  iohexol (OMNIPAQUE) 300 MG/ML solution 100 mL (100 mLs Intravenous Contrast Given 09/16/20 1356)    ED Course  I have reviewed the triage vital signs and the nursing notes.  Pertinent labs & imaging results that were available during my care of the patient were reviewed by me and considered in my medical decision making (see chart for details).    MDM Rules/Calculators/A&P                          Patient is 75 year old female with history of cholecystectomy in the 1990s, presenting for right upper quadrant abdominal pain with some nausea and constipation that began yesterday.  She is afebrile though does have tenderness in the right upper quadrant.  No guarding or rebound.  Symptoms are treated with pain medication and antiemetics.  Blood work is obtained as well as CT imaging.  Metabolic panel shows transaminitis with AST of 1200, ALT 750, alk phos 416 T bili 3.1.  She has normal renal function.  Lipase within normal limits.  Leukocytosis of 13.3.  CT scan shows dilated common bile duct of 11 mm, normal-appearing pancreas.  Consulted with PA with [facility].  Recommends MRCP and likely medicine to admit, will confirm with her attending.  IV Rocephin ordered for nitrite positive bacteriuria as well as to cover intra-abdominal infection.  Pending return call and admission, care is handed off at shift change to Krista Blue, PA-C.  Final Clinical Impression(s) / ED Diagnoses Final diagnoses:  None    Rx / DC Orders ED Discharge Orders     None        Tasmin Exantus, [place] N, PA-C 09/16/20 1539    Isla Pence, MD 09/20/20 [phone]

## 2020-09-16 NOTE — ED Notes (Signed)
Pt to CT

## 2020-09-16 NOTE — ED Triage Notes (Signed)
Arrives via EMS from home, C/C constipation, last night at 10 pm she had R sided abd pain, sharp with radiation to R and L flank, denies urinary changes, last BM Tuesday and it was ' little balls.' Tried to make herself vomit and tried Metamucil this AM w/o relief.   CBG 234

## 2020-09-16 NOTE — Plan of Care (Signed)
  Problem: Education: Goal: Knowledge of General Education information will improve Description: Including pain rating scale, medication(s)/side effects and non-pharmacologic comfort measures Outcome: Progressing   Problem: Clinical Measurements: Goal: Diagnostic test results will improve Outcome: Progressing   Problem: Pain Managment: Goal: General experience of comfort will improve Outcome: Progressing   

## 2020-09-16 NOTE — Plan of Care (Signed)
Care plans initiated.

## 2020-09-16 NOTE — ED Notes (Signed)
Transport called.

## 2020-09-16 NOTE — Consult Note (Signed)
Referring Provider: No ref. provider found Primary Care Physician:  Seward Carol, MD Primary Gastroenterologist:  Dr. Scarlette Shorts  Reason for Consultation: Elevated LFTs  HPI: Krystal Watson is a 75 y.o. female with a past medical history of arthritis, obesity, hypertension, hyperlipidemia, breast cancer s/p bilateral mastectomy 2014, diabetes mellitus type 2 and GERD.  Past ERCP x 2 by Dr. Watt Climes 02/1999 and 2004 secondary to choledocholithiasis with eventual cholecystectomy.  She awakened and 11 PM last evening with significant lower abdominal pain which radiated to her upper abdomen.  She reported eating vegetables stirfry, a small piece of ribeye steak and chicken for dinner at 7 PM.  She went to bed around 10 PM.  She got out of bed and walked in her room for a bit and then drink ginger ale and coffee.  She sat up in the recliner for a few hours without improvement.  She presented to [facility] ED for further evaluation. Labs in the ED showed a WBC count of 13.3.  Hemoglobin 14.3.  Hematocrit 44.9.  Platelet 229.  Sodium 135.  Potassium 3.7.  BUN 11.  Creatinine 0.54.  Alk phos 463.  Lipase 28.  AST 1209.  ALT 747.  Total bili 3.1.  Acute hepatitis panel pending.  SARS coronavirus 2 negative.  CTAP with contrast identified mild intrahepatic and extrahepatic biliary dilatation post cholecystectomy the CBD measured up to 11 mm in diameter.  The pancreas was normal.  Sigmoid diverticulosis without evidence of diverticulitis.  See pain medication and antiemetic.  No further nausea.  No current abdominal pain.  She has a history of choledocholithiasis which required ERCP with stone removal and sphincterotomy by Dr. Gerlean Ren in 2000 and 2004.  She underwent a cholecystectomy after her second ERCP, further details are unclear.  She reports having intermittent "stomach attacks" which for several years occurred once every few months described as lower abdominal pain radiating up to the upper  abdomen with nausea, she self-induced vomiting for relief and her symptoms resolved within a few hours.  However, last year she intentionally lost 50 pounds by eating a healthier diet and her attacks of abdominal pain have significantly reduced, occur once every 4 to 5 months.  No dysphagia or heartburn.  She typically passes a normal brown bowel movement once or twice daily.  No rectal bleeding or melena.  She felt constipated on Tuesday 6/14 after passing 3 small hard stools.  No bowel movement yesterday or today.  Her most recent colonoscopy was in 2013 which showed sigmoid diverticulosis without colon polyps.  Father with history of colon cancer diagnosed at the age of 28.  She was planning to travel on a cruise with her husband and underwent COVID testing 3 to 4 weeks ago which was positive.  She was asymptomatic.  Her PCP prescribed Plaxlovid which she completed.  She was prescribed WelChol and her PCP which was started about 6 months ago.  No recent antibiotics.  She has a history of reflux symptoms which is well controlled on Nexium 20 mg daily.  No other complaints at this time.  Husband is at the bedside.  CTAP with contrast 09/16/2020: Sigmoid diverticulosis without evidence of diverticulitis.  Mild intrahepatic and extrahepatic biliary dilatation post cholecystectomy, recommend correlation with LFTs.  Small RIGHT renal cysts.  No acute intra-abdominal or intrapelvic abnormalities otherwise identified.  Aortic Atherosclerosis  Past GI procedures:  ERCP by Dr. Brett Albino 12/17/2002: Multiple stones, sludge and debris removed from CBD  ERCP 02/03/1999  by Dr. Watt Climes: Tiny ampullary diverticula Slightly dilated common bile duct with a distal duct cut off Status post small 6 mm sphincterotomy Balloon pull-through with debris and a small stone being delivered Normal post sphincterotomy occlusion cholangiogram  Colonoscopy 06/13/2011: Mild diverticulosis in the sigmoid colon No polyps Recall  colonoscopy in 10 years  Colonoscopy 04/15/1999: Internal and external hemorrhoids otherwise normal  Past Medical History:  Diagnosis Date   Allergy    sulfa   Arthritis    Cancer ([place])    breast   Chronic pain    Diabetes mellitus    Diverticulosis    Gallstones    GERD (gastroesophageal reflux disease)    Hyperlipidemia    Hypertension    Obesity    Shortness of breath    with exertion    Past Surgical History:  Procedure Laterality Date   ABDOMINAL HYSTERECTOMY     CHOLECYSTECTOMY     COLONOSCOPY     ERCP     Hx 2x   SIMPLE MASTECTOMY WITH AXILLARY SENTINEL NODE BIOPSY  04/24/2012   Procedure: SIMPLE MASTECTOMY WITH AXILLARY SENTINEL NODE BIOPSY;  Surgeon: Haywood Lasso, MD;  Location: [facility] OR;  Service: General;  Laterality: Bilateral;  Bilateral total mastectomy and bilateral sentinel node    Prior to Admission medications   Medication Sig Start Date End Date Taking? Authorizing Provider  Alum Hydroxide-Mag Carbonate (GAVISCON PO) Take 2 tablets by mouth daily as needed (stomach discomfort). For stomach   Yes [provider]  aspirin 81 MG tablet Take 81 mg by mouth daily.   Yes [provider]  atorvastatin (LIPITOR) 40 MG tablet Take 40 mg by mouth daily.   Yes [provider]  Calcium Carbonate-Vitamin D (CALTRATE 600+D) 600-400 MG-UNIT tablet Take 1 tablet by mouth 2 (two) times daily. Patient taking differently: Take 1 tablet by mouth daily. 10/27/16  Yes Nicholas Lose, MD  colesevelam (WELCHOL) 625 MG tablet TAKE 1 TABLET(625 MG) BY MOUTH TWICE DAILY WITH A MEAL Patient taking differently: Take 625 mg by mouth 2 (two) times daily with a meal. 05/03/20  Yes Renato Shin, MD  diltiazem (DILACOR XR) 240 MG 24 hr capsule Take 240 mg by mouth daily.   Yes [provider]  esomeprazole (NEXIUM) 20 MG capsule Take 1 capsule (20 mg total) by mouth daily at 12 noon. Patient taking differently: Take 20 mg by mouth every other day.  10/27/16  Yes Nicholas Lose, MD  hydrochlorothiazide (HYDRODIURIL) 25 MG tablet Take 25 mg by mouth daily.   Yes [provider]  ibandronate (BONIVA) 150 MG tablet Take 150 mg by mouth every 30 (thirty) days. Take in the morning with a full glass of water, on an empty stomach, and do not take anything else by mouth or lie down for the next 30 min.   Yes [provider]  insulin glargine (LANTUS SOLOSTAR) 100 UNIT/ML Solostar Pen Inject 20 Units into the skin every morning. And pen needles 1/day. 08/26/20  Yes Renato Shin, MD  metFORMIN (GLUCOPHAGE) 500 MG tablet TAKE 1 TABLET(500 MG) BY MOUTH TWICE DAILY WITH A MEAL Patient taking differently: Take 500 mg by mouth 2 (two) times daily with a meal. 05/08/20  Yes Renato Shin, MD  Multiple Vitamins-Minerals (ONE-A-DAY WEIGHT SMART ADVANCE) TABS Take 1 tablet by mouth daily.   Yes [provider]  phenylephrine (SUDAFED PE) 10 MG TABS Take 10 mg by mouth every 4 (four) hours as needed. For allergies   Yes [provider]  PROAIR HFA 108 (90 BASE) MCG/ACT inhaler Inhale 2 puffs into the lungs every 6 (six) hours as needed for wheezing or shortness of breath. 09/16/14  Yes [provider]  promethazine-dextromethorphan (PROMETHAZINE-DM) 6.25-15 MG/5ML syrup Take 5 mLs by mouth every 6 (six) hours as needed for cough. 08/28/20  Yes [provider]  ramipril (ALTACE) 10 MG capsule Take 10 mg by mouth daily.   Yes [provider]  repaglinide (PRANDIN) 2 MG tablet Take 1 tablet (2 mg total) by mouth 3 (three) times daily before meals. 06/24/20  Yes Renato Shin, MD  glucose blood (ONETOUCH VERIO) test strip 1 each by Other route in the morning and at bedtime. And lancets 2/day 05/03/20   Renato Shin, MD  Insulin Pen Needle (PEN NEEDLES) 31G X 5 MM MISC Use to inject insulin into skin once daily in the morning. 02/24/20   Renato Shin, MD  Lancets (ONETOUCH DELICA PLUS [id number]) [place] USE AS DIRECTED  TO TEST BLOOD SUGAR DAILY 09/23/19   Renato Shin, MD  UNABLE TO FIND Rx: L8000-Post Surgical Bras (Quantity: 6) T8882- Silicone Breast Prosthesis (Quantity: 2) Dx: 174.9; bilateral mastectomies 10/23/13   Erroll Luna, MD    Current Facility-Administered Medications  Medication Dose Route Frequency Provider Last Rate Last Admin   cefTRIAXone (ROCEPHIN) 1 g in sodium chloride 0.9 % 100 mL IVPB  1 g Intravenous Once Robinson, [place] N, PA-C       Current Outpatient Medications  Medication Sig Dispense Refill   Alum Hydroxide-Mag Carbonate (GAVISCON PO) Take 2 tablets by mouth daily as needed (stomach discomfort). For stomach     aspirin 81 MG tablet Take 81 mg by mouth daily.     atorvastatin (LIPITOR) 40 MG tablet Take 40 mg by mouth daily.     Calcium Carbonate-Vitamin D (CALTRATE 600+D) 600-400 MG-UNIT tablet Take 1 tablet by mouth 2 (two) times daily. (Patient taking differently: Take 1 tablet by mouth daily.)     colesevelam (WELCHOL) 625 MG tablet TAKE 1 TABLET(625 MG) BY MOUTH TWICE DAILY WITH A MEAL (Patient taking differently: Take 625 mg by mouth 2 (two) times daily with a meal.) 90 tablet 3   diltiazem (DILACOR XR) 240 MG 24 hr capsule Take 240 mg by mouth daily.     esomeprazole (NEXIUM) 20 MG capsule Take 1 capsule (20 mg total) by mouth daily at 12 noon. (Patient taking differently: Take 20 mg by mouth every other day.)     hydrochlorothiazide (HYDRODIURIL) 25 MG tablet Take 25 mg by mouth daily.     ibandronate (BONIVA) 150 MG tablet Take 150 mg by mouth every 30 (thirty) days. Take in the morning with a full glass of water, on an empty stomach, and do not take anything else by mouth or lie down for the next 30 min.     insulin glargine (LANTUS SOLOSTAR) 100 UNIT/ML Solostar Pen Inject 20 Units into the skin every morning. And pen needles 1/day. 15 mL 0   metFORMIN (GLUCOPHAGE) 500 MG tablet TAKE 1 TABLET(500 MG) BY MOUTH TWICE DAILY WITH A MEAL (Patient taking differently: Take  500 mg by mouth 2 (two) times daily with a meal.) 180 tablet 3   Multiple Vitamins-Minerals (ONE-A-DAY WEIGHT SMART ADVANCE) TABS Take 1 tablet by mouth daily.     phenylephrine (SUDAFED PE) 10 MG TABS Take 10 mg by mouth every 4 (four) hours as needed. For allergies     PROAIR HFA 108 (90 BASE) MCG/ACT inhaler Inhale  2 puffs into the lungs every 6 (six) hours as needed for wheezing or shortness of breath.  0   promethazine-dextromethorphan (PROMETHAZINE-DM) 6.25-15 MG/5ML syrup Take 5 mLs by mouth every 6 (six) hours as needed for cough.     ramipril (ALTACE) 10 MG capsule Take 10 mg by mouth daily.     repaglinide (PRANDIN) 2 MG tablet Take 1 tablet (2 mg total) by mouth 3 (three) times daily before meals. 270 tablet 3   glucose blood (ONETOUCH VERIO) test strip 1 each by Other route in the morning and at bedtime. And lancets 2/day 200 strip 3   Insulin Pen Needle (PEN NEEDLES) 31G X 5 MM MISC Use to inject insulin into skin once daily in the morning. 100 each 12   Lancets (ONETOUCH DELICA PLUS [id number]) MISC USE AS DIRECTED TO TEST BLOOD SUGAR DAILY 100 each 0   UNABLE TO FIND Rx: L8000-Post Surgical Bras (Quantity: 6) K1601- Silicone Breast Prosthesis (Quantity: 2) Dx: 174.9; bilateral mastectomies 1 each 0    Allergies as of 09/16/2020 - Review Complete 09/16/2020  Allergen Reaction Noted   Levofloxacin Other (See Comments) 08/23/2020   Sulfonamide derivatives      Family History  Problem Relation Age of Onset   Colon cancer Father 72   Diabetes Mother    Heart disease Mother    Breast cancer Sister    Esophageal cancer Neg Hx    Stomach cancer Neg Hx    Rectal cancer Neg Hx     Social History   Socioeconomic History   Marital status: Married    Spouse name: Not on file   Number of children: 2   Years of education: Not on file   Highest education level: Not on file  Occupational History   Occupation: Pharmacist, hospital  Tobacco Use   Smoking status: Never   Smokeless tobacco:  Never  Substance and Sexual Activity   Alcohol use: No    Comment: rare   Drug use: No   Sexual activity: Yes  Other Topics Concern   Not on file  Social History Narrative   Not on file   Social Determinants of Health   Financial Resource Strain: Not on file  Food Insecurity: Not on file  Transportation Needs: Not on file  Physical Activity: Not on file  Stress: Not on file  Social Connections: Not on file  Intimate Partner Violence: Not on file    Review of Systems: See HPI, all other systems reviewed and are negative  Physical Exam: Vital signs in last 24 hours: Temp:  [98 F (36.7 C)] 98 F (36.7 C) (06/16 1042) Pulse Rate:  [80-89] 81 (06/16 1440) Resp:  [16-18] 18 (06/16 1440) BP: (135-171)/(69-90) 135/74 (06/16 1440) SpO2:  [91 %-99 %] 98 % (06/16 1440)   General:  Alert 75 year old female in no acute distress. Head:  Normocephalic and atraumatic. Eyes:  No scleral icterus. Conjunctiva pink. Ears:  Normal auditory acuity. Nose:  No deformity, discharge or lesions. Mouth: Patient reports front upper tooth is slightly loose.  No ulcers or lesions.  Neck:  Supple. No lymphadenopathy or thyromegaly.  Lungs: Breath sounds clear throughout. Heart: Regular rate and rhythm, no murmurs. Abdomen:, Nontender, no masses or organomegaly. Rectal: Deferred. Musculoskeletal:  Symmetrical without gross deformities.  Pulses:  Normal pulses noted. Extremities:  Without clubbing or edema. Neurologic:  Alert and  oriented x4. No focal deficits.  Skin:  Intact without significant lesions or rashes. Psych:  Alert and cooperative. Normal mood  and affect.  Intake/Output from previous day: No intake/output data recorded. Intake/Output this shift: Total I/O In: 500 [IV Piggyback:500] Out: -   Lab Results: Recent Labs    09/16/20 1101  WBC 13.3*  HGB 14.3  HCT 44.9  PLT 229   BMET Recent Labs    09/16/20 1256  NA 135  K 3.7  CL 96*  CO2 28  GLUCOSE 204*  BUN 11   CREATININE 0.54  CALCIUM 9.4   LFT Recent Labs    09/16/20 1256  PROT 7.7  ALBUMIN 3.9  AST 1,209*  ALT 747*  ALKPHOS 463*  BILITOT 3.1*   PT/INR No results for input(s): LABPROT, INR in the last 72 hours. Hepatitis Panel No results for input(s): HEPBSAG, HCVAB, HEPAIGM, HEPBIGM in the last 72 hours.    Studies/Results: CT Abdomen Pelvis W Contrast  Result Date: 09/16/2020 CLINICAL DATA:  RIGHT-side abdominal pain, sharp pain radiating to both flanks, constipation EXAM: CT ABDOMEN AND PELVIS WITH CONTRAST TECHNIQUE: Multidetector CT imaging of the abdomen and pelvis was performed using the standard protocol following bolus administration of intravenous contrast. Sagittal and coronal MPR images reconstructed from axial data set. CONTRAST:  120m OMNIPAQUE IOHEXOL 300 MG/ML SOLN IV. No oral contrast. COMPARISON:  09/20/2005 FINDINGS: Lower chest: Bibasilar atelectasis Hepatobiliary: Gallbladder surgically absent. Liver unremarkable. Mild intrahepatic and extrahepatic biliary dilatation, CBD up to 11 mm diameter Pancreas: Normal appearance Spleen: Normal appearance Adrenals/Urinary Tract: Two small RIGHT renal cysts, larger at inferior pole 3.0 cm greatest size. Adrenal glands, kidneys, ureters, and bladder otherwise normal appearance Stomach/Bowel: Normal appendix. Scattered normal stool burden. Sigmoid diverticulosis without evidence of diverticulitis. Stomach decompressed. Remaining bowel loops unremarkable. Vascular/Lymphatic: Vascular structures patent. Aorta normal caliber with scattered atherosclerotic calcifications of aorta, iliac arteries and visceral arteries. No adenopathy. Scattered pelvic phleboliths. Reproductive: Uterus surgically absent.  Atrophic ovaries. Other: No free air or free fluid. No hernia or inflammatory process. Musculoskeletal: Facet degenerative changes lumbar spine. Bones demineralized. IMPRESSION: Sigmoid diverticulosis without evidence of diverticulitis. Mild  intrahepatic and extrahepatic biliary dilatation post cholecystectomy, recommend correlation with LFTs. Small RIGHT renal cysts. No acute intra-abdominal or intrapelvic abnormalities otherwise identified. Aortic Atherosclerosis (ICD10-I70.0). Electronically Signed   By: MLavonia DanaM.D.   On: 09/16/2020 14:30    IMPRESSION/PLAN:  174  75year old female with lower abdominal pain which radiates to the upper abdomen, elevated LFTs.  CTAP with contrast identified mild intrahepatic and extrahepatic biliary dilatation.  Acute hepatitis panel pending. -NPO for abdominal MRI/MRCP with and without contrast -Clear liquid diet after MRI completed -Zofran 4 mg p.o. or IV every 6 hours as needed -IV fluids and pain management per the hospitalist -CBC, hepatic panel and BMP in a.m. -Further recommendation to be determined after abdominal MRI/MRCP results reviewed  2.  Colon cancer screening.  Father with history of colon cancer. -Colonoscopy as outpatient  3.  History of reflux symptoms -Pantoprazole 40 mg p.o. daily  3.  History of diabetes mellitus type 2  4.  History of breast cancer status post bilateral mastectomy    CNoralyn Pick 09/16/2020, 3:49 PM

## 2020-09-16 NOTE — H&P (Signed)
History and Physical    Krystal Watson IOM:[id number] DOB: [birth year] DOA: 09/16/2020  PCP: Seward Carol, MD   Patient coming from: Home  I have personally briefly reviewed patient's old medical records in [place]  Chief Complaint: Abdominal pain with vomiting  HPI: Krystal Watson is a 75 y.o. female with medical history significant of hypertension, diabetes mellitus type 2, GERD, hyperlipidemia, breast cancer status post bilateral mastectomy, choledocholithiasis requiring ERCP x2 with eventual cholecystectomy, COVID-positive few weeks ago treated with Paxlovid as an outpatient presented with worsening abdominal pain.  Patient started having abdominal pain last night, which started in the lower abdomen and radiated to the upper abdomen, sharp in nature, up to 10 out of 10 in intensity associated with nausea and vomiting.  Patient denies any fever, chills, dysuria, flank pain, diarrhea, chest pain, palpitations, shortness of breath, seizures or loss of consciousness.  ED Course: She was found to have elevated LFTs with AST of 1209, ALT of 747, total bili of 3.1, alkaline phosphatase of 463.  Lipase was 28. CTAP with contrast identified mild intrahepatic and extrahepatic biliary dilatation post cholecystectomy the CBD measured up to 11 mm in diameter.  The pancreas was normal.  Sigmoid diverticulosis without evidence of diverticulitis.  GI was consulted who recommended MRI/MRCP. Hospitalist service was called to evaluate the patient.  Review of Systems: As per HPI otherwise all other systems were reviewed and are negative.   Past Medical History:  Diagnosis Date   Allergy    sulfa   Arthritis    Cancer ([place])    breast   Chronic pain    Diabetes mellitus    Diverticulosis    Gallstones    GERD (gastroesophageal reflux disease)    Hyperlipidemia    Hypertension    Obesity    Shortness of breath    with exertion    Past Surgical History:  Procedure Laterality  Date   ABDOMINAL HYSTERECTOMY     CHOLECYSTECTOMY     COLONOSCOPY     ERCP     Hx 2x   SIMPLE MASTECTOMY WITH AXILLARY SENTINEL NODE BIOPSY  04/24/2012   Procedure: SIMPLE MASTECTOMY WITH AXILLARY SENTINEL NODE BIOPSY;  Surgeon: Haywood Lasso, MD;  Location: [facility] OR;  Service: General;  Laterality: Bilateral;  Bilateral total mastectomy and bilateral sentinel node     reports that she has never smoked. She has never used smokeless tobacco. She reports that she does not drink alcohol and does not use drugs.  Allergies  Allergen Reactions   Levofloxacin Other (See Comments)    Ankle pain   Sulfonamide Derivatives     Pt can not remember the reaction    Family History  Problem Relation Age of Onset   Colon cancer Father 34   Diabetes Mother    Heart disease Mother    Breast cancer Sister    Esophageal cancer Neg Hx    Stomach cancer Neg Hx    Rectal cancer Neg Hx     Prior to Admission medications   Medication Sig Start Date End Date Taking? Authorizing Provider  Alum Hydroxide-Mag Carbonate (GAVISCON PO) Take 2 tablets by mouth daily as needed (stomach discomfort). For stomach   Yes [provider]  aspirin 81 MG tablet Take 81 mg by mouth daily.   Yes [provider]  atorvastatin (LIPITOR) 40 MG tablet Take 40 mg by mouth daily.   Yes [provider]  Calcium Carbonate-Vitamin D (CALTRATE 600+D) 600-400 MG-UNIT  tablet Take 1 tablet by mouth 2 (two) times daily. Patient taking differently: Take 1 tablet by mouth daily. 10/27/16  Yes Nicholas Lose, MD  colesevelam (WELCHOL) 625 MG tablet TAKE 1 TABLET(625 MG) BY MOUTH TWICE DAILY WITH A MEAL Patient taking differently: Take 625 mg by mouth 2 (two) times daily with a meal. 05/03/20  Yes Renato Shin, MD  diltiazem (DILACOR XR) 240 MG 24 hr capsule Take 240 mg by mouth daily.   Yes [provider]  esomeprazole (NEXIUM) 20 MG capsule Take 1 capsule (20 mg total) by mouth daily at 12  noon. Patient taking differently: Take 20 mg by mouth every other day. 10/27/16  Yes Nicholas Lose, MD  hydrochlorothiazide (HYDRODIURIL) 25 MG tablet Take 25 mg by mouth daily.   Yes [provider]  ibandronate (BONIVA) 150 MG tablet Take 150 mg by mouth every 30 (thirty) days. Take in the morning with a full glass of water, on an empty stomach, and do not take anything else by mouth or lie down for the next 30 min.   Yes [provider]  insulin glargine (LANTUS SOLOSTAR) 100 UNIT/ML Solostar Pen Inject 20 Units into the skin every morning. And pen needles 1/day. 08/26/20  Yes Renato Shin, MD  metFORMIN (GLUCOPHAGE) 500 MG tablet TAKE 1 TABLET(500 MG) BY MOUTH TWICE DAILY WITH A MEAL Patient taking differently: Take 500 mg by mouth 2 (two) times daily with a meal. 05/08/20  Yes Renato Shin, MD  Multiple Vitamins-Minerals (ONE-A-DAY WEIGHT SMART ADVANCE) TABS Take 1 tablet by mouth daily.   Yes [provider]  phenylephrine (SUDAFED PE) 10 MG TABS Take 10 mg by mouth every 4 (four) hours as needed. For allergies   Yes [provider]  PROAIR HFA 108 (90 BASE) MCG/ACT inhaler Inhale 2 puffs into the lungs every 6 (six) hours as needed for wheezing or shortness of breath. 09/16/14  Yes [provider]  promethazine-dextromethorphan (PROMETHAZINE-DM) 6.25-15 MG/5ML syrup Take 5 mLs by mouth every 6 (six) hours as needed for cough. 08/28/20  Yes [provider]  ramipril (ALTACE) 10 MG capsule Take 10 mg by mouth daily.   Yes [provider]  repaglinide (PRANDIN) 2 MG tablet Take 1 tablet (2 mg total) by mouth 3 (three) times daily before meals. 06/24/20  Yes Renato Shin, MD  glucose blood (ONETOUCH VERIO) test strip 1 each by Other route in the morning and at bedtime. And lancets 2/day 05/03/20   Renato Shin, MD  Insulin Pen Needle (PEN NEEDLES) 31G X 5 MM MISC Use to inject insulin into skin once daily in the morning. 02/24/20   Renato Shin, MD  Lancets [facility] DELICA PLUS [id number]) [place] USE AS DIRECTED TO TEST BLOOD SUGAR DAILY 09/23/19   Renato Shin, MD  UNABLE TO FIND Rx: L8000-Post Surgical Bras (Quantity: 6) L3810- Silicone Breast Prosthesis (Quantity: 2) Dx: 174.9; bilateral mastectomies 10/23/13   Erroll Luna, MD    Physical Exam: Vitals:   09/16/20 1440 09/16/20 1500 09/16/20 1530 09/16/20 1635  BP: 135/74 137/73 (!) 149/80 136/77  Pulse: 81 73 78 82  Resp: 18 18 18 16   Temp:      TempSrc:      SpO2: 98% 93% 97% 96%    Constitutional: NAD, calm, comfortable Vitals:   09/16/20 1440 09/16/20 1500 09/16/20 1530 09/16/20 1635  BP: 135/74 137/73 (!) 149/80 136/77  Pulse: 81 73 78 82  Resp: 18 18 18 16   Temp:  TempSrc:      SpO2: 98% 93% 97% 96%   Eyes: PERRL, lids and conjunctivae normal ENMT: Mucous membranes are slightly dry. Posterior pharynx clear of any exudate or lesions. Neck: normal, supple, no masses, no thyromegaly Respiratory: bilateral decreased breath sounds at bases, no wheezing, no crackles. Normal respiratory effort. No accessory muscle use.  Cardiovascular: S1 S2 positive, rate controlled. No extremity edema. 2+ pedal pulses.  Abdomen: Mildly tender in the right upper quadrant, no masses palpated. No hepatosplenomegaly. Bowel sounds positive.  Musculoskeletal: no clubbing / cyanosis. No joint deformity upper and lower extremities.  Skin: no rashes, lesions, ulcers. No induration Neurologic: CN 2-12 grossly intact. Moving extremities. No focal neurologic deficits.  Psychiatric: Normal judgment and insight. Alert and oriented x 3. Normal mood.    Labs on Admission: I have personally reviewed following labs and imaging studies  CBC: Recent Labs  Lab 09/16/20 1101  WBC 13.3*  HGB 14.3  HCT 44.9  MCV 96.8  PLT 128   Basic Metabolic Panel: Recent Labs  Lab 09/16/20 1256  NA 135  K 3.7  CL 96*  CO2 28  GLUCOSE 204*  BUN 11  CREATININE 0.54  CALCIUM 9.4    GFR: CrCl cannot be calculated (Unknown ideal weight.). Liver Function Tests: Recent Labs  Lab 09/16/20 1256  AST 1,209*  ALT 747*  ALKPHOS 463*  BILITOT 3.1*  PROT 7.7  ALBUMIN 3.9   Recent Labs  Lab 09/16/20 1256  LIPASE 28   No results for input(s): AMMONIA in the last 168 hours. Coagulation Profile: No results for input(s): INR, PROTIME in the last 168 hours. Cardiac Enzymes: No results for input(s): CKTOTAL, CKMB, CKMBINDEX, TROPONINI in the last 168 hours. BNP (last 3 results) No results for input(s): PROBNP in the last 8760 hours. HbA1C: No results for input(s): HGBA1C in the last 72 hours. CBG: No results for input(s): GLUCAP in the last 168 hours. Lipid Profile: No results for input(s): CHOL, HDL, LDLCALC, TRIG, CHOLHDL, LDLDIRECT in the last 72 hours. Thyroid Function Tests: No results for input(s): TSH, T4TOTAL, FREET4, T3FREE, THYROIDAB in the last 72 hours. Anemia Panel: No results for input(s): VITAMINB12, FOLATE, FERRITIN, TIBC, IRON, RETICCTPCT in the last 72 hours. Urine analysis:    Component Value Date/Time   COLORURINE YELLOW (A) 09/16/2020 1146   APPEARANCEUR HAZY (A) 09/16/2020 1146   LABSPEC 1.025 09/16/2020 1146   PHURINE 7.0 09/16/2020 1146   GLUCOSEU 1,000 (A) 09/16/2020 1146   HGBUR NEGATIVE 09/16/2020 1146   BILIRUBINUR SMALL (A) 09/16/2020 1146   KETONESUR NEGATIVE 09/16/2020 1146   PROTEINUR 100 (A) 09/16/2020 1146   UROBILINOGEN 1.0 09/08/2014 0429   NITRITE POSITIVE (A) 09/16/2020 1146   LEUKOCYTESUR NEGATIVE 09/16/2020 1146    Radiological Exams on Admission: CT Abdomen Pelvis W Contrast  Result Date: 09/16/2020 CLINICAL DATA:  RIGHT-side abdominal pain, sharp pain radiating to both flanks, constipation EXAM: CT ABDOMEN AND PELVIS WITH CONTRAST TECHNIQUE: Multidetector CT imaging of the abdomen and pelvis was performed using the standard protocol following bolus administration of intravenous contrast. Sagittal and coronal MPR  images reconstructed from axial data set. CONTRAST:  154mL OMNIPAQUE IOHEXOL 300 MG/ML SOLN IV. No oral contrast. COMPARISON:  09/20/2005 FINDINGS: Lower chest: Bibasilar atelectasis Hepatobiliary: Gallbladder surgically absent. Liver unremarkable. Mild intrahepatic and extrahepatic biliary dilatation, CBD up to 11 mm diameter Pancreas: Normal appearance Spleen: Normal appearance Adrenals/Urinary Tract: Two small RIGHT renal cysts, larger at inferior pole 3.0 cm greatest size. Adrenal glands, kidneys, ureters, and  bladder otherwise normal appearance Stomach/Bowel: Normal appendix. Scattered normal stool burden. Sigmoid diverticulosis without evidence of diverticulitis. Stomach decompressed. Remaining bowel loops unremarkable. Vascular/Lymphatic: Vascular structures patent. Aorta normal caliber with scattered atherosclerotic calcifications of aorta, iliac arteries and visceral arteries. No adenopathy. Scattered pelvic phleboliths. Reproductive: Uterus surgically absent.  Atrophic ovaries. Other: No free air or free fluid. No hernia or inflammatory process. Musculoskeletal: Facet degenerative changes lumbar spine. Bones demineralized. IMPRESSION: Sigmoid diverticulosis without evidence of diverticulitis. Mild intrahepatic and extrahepatic biliary dilatation post cholecystectomy, recommend correlation with LFTs. Small RIGHT renal cysts. No acute intra-abdominal or intrapelvic abnormalities otherwise identified. Aortic Atherosclerosis (ICD10-I70.0). Electronically Signed   By: Lavonia Dana M.D.   On: 09/16/2020 14:30     Assessment/Plan  Abdominal pain with intrahepatic and extrahepatic biliary dilatation seen on CT of the abdomen and pelvis Elevated LFTs -Patient presented with abdominal pain with nausea and vomiting with elevated LFTs and intrahepatic and extrahepatic biliary dilatation on CT of the abdomen and pelvis. -GI following.  MRI/MRCP of abdomen. -Continue IV fluids.  Repeat a.m. LFTs.  Hepatitis  panel. -Rocephin given in the ED.  We will continue Rocephin in case patient has underlying cholangitis as well. -Hold statin  Diabetes mellitus type 2 -Continue long-acting insulin but at a lower dose.  CBGs with SSI -Oral meds on hold  Hypertension -Monitor blood pressure.  Hold hydrochlorothiazide and ramipril for now.  Resume diltiazem in a.m.  Hyperlipidemia -Hold statin     DVT prophylaxis: SCDs. Code Status: Full Family Communication: Husband at bedside Disposition Plan: Home in 1 to 2 days once clinically improved Consults called: GI by ED provider Admission status: Observation/MedSurg  Severity of Illness: The appropriate patient status for this patient is OBSERVATION. Observation status is judged to be reasonable and necessary in order to provide the required intensity of service to ensure the patient's safety. The patient's presenting symptoms, physical exam findings, and initial radiographic and laboratory data in the context of their medical condition is felt to place them at decreased risk for further clinical deterioration. Furthermore, it is anticipated that the patient will be medically stable for discharge from the hospital within 2 midnights of admission. The following factors support the patient status of observation.   " The patient's presenting symptoms include abdominal pain/vomiting. " The physical exam findings include mild abdominal tenderness. " The initial radiographic and laboratory data are elevated LFTs/dilated biliary tree.    Aline August MD [facility]  09/16/2020, 5:48 PM

## 2020-09-17 DIAGNOSIS — I10 Essential (primary) hypertension: Secondary | ICD-10-CM | POA: Diagnosis not present

## 2020-09-17 DIAGNOSIS — K8050 Calculus of bile duct without cholangitis or cholecystitis without obstruction: Secondary | ICD-10-CM | POA: Diagnosis not present

## 2020-09-17 DIAGNOSIS — R1013 Epigastric pain: Secondary | ICD-10-CM | POA: Diagnosis not present

## 2020-09-17 DIAGNOSIS — R7401 Elevation of levels of liver transaminase levels: Secondary | ICD-10-CM

## 2020-09-17 DIAGNOSIS — E785 Hyperlipidemia, unspecified: Secondary | ICD-10-CM | POA: Diagnosis not present

## 2020-09-17 DIAGNOSIS — R109 Unspecified abdominal pain: Secondary | ICD-10-CM | POA: Diagnosis not present

## 2020-09-17 DIAGNOSIS — R7989 Other specified abnormal findings of blood chemistry: Secondary | ICD-10-CM | POA: Diagnosis not present

## 2020-09-17 DIAGNOSIS — E1169 Type 2 diabetes mellitus with other specified complication: Secondary | ICD-10-CM | POA: Diagnosis not present

## 2020-09-17 LAB — COMPREHENSIVE METABOLIC PANEL
ALT: 563 U/L — ABNORMAL HIGH (ref 0–44)
AST: 493 U/L — ABNORMAL HIGH (ref 15–41)
Albumin: 3.5 g/dL (ref 3.5–5.0)
Alkaline Phosphatase: 394 U/L — ABNORMAL HIGH (ref 38–126)
Anion gap: 8 (ref 5–15)
BUN: 12 mg/dL (ref 8–23)
CO2: 28 mmol/L (ref 22–32)
Calcium: 9.2 mg/dL (ref 8.9–10.3)
Chloride: 105 mmol/L (ref 98–111)
Creatinine, Ser: 0.64 mg/dL (ref 0.44–1.00)
GFR, Estimated: >60 mL/min (ref >60)
Glucose, Bld: 218 mg/dL — ABNORMAL HIGH (ref 70–99)
Potassium: 3.6 mmol/L (ref 3.5–5.1)
Sodium: 141 mmol/L (ref 135–145)
Total Bilirubin: 1.4 mg/dL — ABNORMAL HIGH (ref 0.3–1.2)
Total Protein: 6.8 g/dL (ref 6.5–8.1)

## 2020-09-17 LAB — CBC
HCT: 39.8 % (ref 36.0–46.0)
Hemoglobin: 12.6 g/dL (ref 12.0–15.0)
MCH: 30.7 pg (ref 26.0–34.0)
MCHC: 31.7 g/dL (ref 30.0–36.0)
MCV: 97.1 fL (ref 80.0–100.0)
Platelets: 243 10*3/uL (ref 150–400)
RBC: 4.1 MIL/uL (ref 3.87–5.11)
RDW: 13.1 % (ref 11.5–15.5)
WBC: 7.8 10*3/uL (ref 4.0–10.5)
nRBC: 0 % (ref 0.0–0.2)

## 2020-09-17 LAB — HEPATITIS PANEL, ACUTE
HCV Ab: NONREACTIVE
Hep A IgM: NONREACTIVE
Hep B C IgM: NONREACTIVE
Hepatitis B Surface Ag: NONREACTIVE

## 2020-09-17 LAB — GLUCOSE, CAPILLARY
Glucose-Capillary: 140 mg/dL — ABNORMAL HIGH (ref 70–99)
Glucose-Capillary: 172 mg/dL — ABNORMAL HIGH (ref 70–99)

## 2020-09-17 LAB — SARS CORONAVIRUS 2 (TAT 6-24 HRS): SARS Coronavirus 2: POSITIVE — AB

## 2020-09-17 LAB — PROTIME-INR
INR: 1.1 (ref 0.8–1.2)
Prothrombin Time: 14.4 seconds (ref 11.4–15.2)

## 2020-09-17 MED ORDER — ESOMEPRAZOLE MAGNESIUM 20 MG PO CPDR: 20.0000 mg | DELAYED_RELEASE_CAPSULE | ORAL | Status: AC

## 2020-09-17 MED ORDER — CALCIUM CARBONATE-VITAMIN D 600-400 MG-UNIT PO TABS: 1.0000 | ORAL_TABLET | Freq: Every day | ORAL | Status: AC

## 2020-09-17 MED ORDER — ONDANSETRON HCL 4 MG PO TABS: 4.0000 mg | ORAL_TABLET | Freq: Four times a day (QID) | ORAL | 0 refills | Status: DC | PRN

## 2020-09-17 MED ORDER — DILTIAZEM HCL ER COATED BEADS 240 MG PO CP24
240.0000 mg | ORAL_CAPSULE | Freq: Every day | ORAL | Status: DC
  Administered 2020-09-17: 240 mg via ORAL
  Filled 2020-09-17: qty 1

## 2020-09-17 NOTE — Progress Notes (Signed)
[facility] Progress Note  CC:   Elevated LFTs  Subjective: She feels well this morning.  No further episodes of upper or lower abdominal pain.  No BM since admission.  She was n.p.o. overnight.   Objective:  Abdominal MRI/MRCP w/wo contrast 09/16/2020: 1. Mild intra and extrahepatic biliary duct dilatation in this patient status post cholecystectomy. Features not substantially changed since abdomen/pelvis CT of 08/07/2008 and are likely related to prior cholecystectomy. There is no evidence for choledocholithiasis by MRCP today. 2. 2.8 cm simple cyst lower pole right kidney with additional tiny T2 hyperintensities in both kidneys, too small to characterize but likely also cysts.   Vital signs in last 24 hours: Temp:  [97.9 F (36.6 C)-98.7 F (37.1 C)] 98.7 F (37.1 C) (06/17 0530) Pulse Rate:  [73-89] 80 (06/17 0530) Resp:  [16-18] 18 (06/17 0530) BP: (121-171)/(56-90) 123/72 (06/17 0530) SpO2:  [91 %-99 %] 97 % (06/17 0530) Weight:  [93.4 kg] 93.4 kg (06/16 1829) Last BM Date: 09/14/20 General:   Alert 75 year old female in no acute distress. Heart: Regular rate and rhythm, no murmurs. Pulm: Breath sounds clear throughout. Abdomen: Soft, nondistended.  Nontender.  Positive bowel sounds all 4 quadrants. Extremities:  Without edema. Neurologic:  Alert and  oriented x4. Grossly normal neurologically. Psych:  Alert and cooperative. Normal mood and affect.  Intake/Output from previous day: 06/16 0701 - 06/17 0700 In: 1451.3 [I.V.:851.3; IV Piggyback:600] Out: -  Intake/Output this shift: No intake/output data recorded.  Lab Results: Recent Labs    09/16/20 1101 09/17/20 0322  WBC 13.3* 7.8  HGB 14.3 12.6  HCT 44.9 39.8  PLT 229 243   BMET Recent Labs    09/16/20 1256 09/17/20 0322  NA 135 141  K 3.7 3.6  CL 96* 105  CO2 28 28  GLUCOSE 204* 218*  BUN 11 12  CREATININE 0.54 0.64  CALCIUM 9.4 9.2   LFT Recent Labs    09/17/20 0322   PROT 6.8  ALBUMIN 3.5  AST 493*  ALT 563*  ALKPHOS 394*  BILITOT 1.4*   PT/INR Recent Labs    09/17/20 0322  LABPROT 14.4  INR 1.1   Hepatitis Panel Recent Labs    09/16/20 1838  HEPBSAG NON REACTIVE  HCVAB NON REACTIVE  HEPAIGM NON REACTIVE  HEPBIGM NON REACTIVE    CT Abdomen Pelvis W Contrast  Result Date: 09/16/2020 CLINICAL DATA:  RIGHT-side abdominal pain, sharp pain radiating to both flanks, constipation EXAM: CT ABDOMEN AND PELVIS WITH CONTRAST TECHNIQUE: Multidetector CT imaging of the abdomen and pelvis was performed using the standard protocol following bolus administration of intravenous contrast. Sagittal and coronal MPR images reconstructed from axial data set. CONTRAST:  168m OMNIPAQUE IOHEXOL 300 MG/ML SOLN IV. No oral contrast. COMPARISON:  09/20/2005 FINDINGS: Lower chest: Bibasilar atelectasis Hepatobiliary: Gallbladder surgically absent. Liver unremarkable. Mild intrahepatic and extrahepatic biliary dilatation, CBD up to 11 mm diameter Pancreas: Normal appearance Spleen: Normal appearance Adrenals/Urinary Tract: Two small RIGHT renal cysts, larger at inferior pole 3.0 cm greatest size. Adrenal glands, kidneys, ureters, and bladder otherwise normal appearance Stomach/Bowel: Normal appendix. Scattered normal stool burden. Sigmoid diverticulosis without evidence of diverticulitis. Stomach decompressed. Remaining bowel loops unremarkable. Vascular/Lymphatic: Vascular structures patent. Aorta normal caliber with scattered atherosclerotic calcifications of aorta, iliac arteries and visceral arteries. No adenopathy. Scattered pelvic phleboliths. Reproductive: Uterus surgically absent.  Atrophic ovaries. Other: No free air or free fluid. No hernia or inflammatory process. Musculoskeletal: Facet degenerative changes lumbar spine. Bones  demineralized. IMPRESSION: Sigmoid diverticulosis without evidence of diverticulitis. Mild intrahepatic and extrahepatic biliary dilatation post  cholecystectomy, recommend correlation with LFTs. Small RIGHT renal cysts. No acute intra-abdominal or intrapelvic abnormalities otherwise identified. Aortic Atherosclerosis (ICD10-I70.0). Electronically Signed   By: Lavonia Dana M.D.   On: 09/16/2020 14:30   MR 3D Recon At Scanner  Result Date: 09/16/2020 CLINICAL DATA:  Biliary dilatation on CT scan.  Renal cyst. EXAM: MRI ABDOMEN WITHOUT AND WITH CONTRAST (INCLUDING MRCP) TECHNIQUE: Multiplanar multisequence MR imaging of the abdomen was performed both before and after the administration of intravenous contrast. Heavily T2-weighted images of the biliary and pancreatic ducts were obtained, and three-dimensional MRCP images were rendered by post processing. CONTRAST:  24m GADAVIST GADOBUTROL 1 MMOL/ML IV SOLN COMPARISON:  CT scan earlier same day FINDINGS: Lower chest: Unremarkable. Hepatobiliary: No focal abnormality identified within the liver parenchyma. Gallbladder surgically absent. There is mild intrahepatic biliary duct dilatation. Common duct in the hepatoduodenal ligament measures 10-11 mm today, not substantially changed from 9 mm on a CT scan of 08/07/2008. The duct than gradually tapers into the ampulla measuring 7 mm diameter in the head of the pancreas compared to 6 mm on the CT from 2010. MRCP imaging shows no choledocholithiasis. No discernible obstructing lesion in the head of the pancreas. Pancreas: No focal mass lesion. No dilatation of the main duct. No intraparenchymal cyst. No peripancreatic edema. Spleen:  No splenomegaly. No focal mass lesion. Adrenals/Urinary Tract: No adrenal nodule or mass. 2.8 cm simple cyst noted lower pole right kidney. Additional tiny T2 hyperintensities in both kidneys measure 6 mm less in size, too small to characterize but no discernible enhancement in these are also likely simple cyst. Stomach/Bowel: Stomach is unremarkable. No gastric wall thickening. No evidence of outlet obstruction. Duodenum is normally  positioned as is the ligament of Treitz. No small bowel or colonic dilatation within the visualized abdomen. Vascular/Lymphatic: No abdominal aortic aneurysm. No abdominal lymphadenopathy. Other:  No intraperitoneal free fluid. Musculoskeletal: No focal suspicious marrow enhancement within the visualized bony anatomy. IMPRESSION: 1. Mild intra and extrahepatic biliary duct dilatation in this patient status post cholecystectomy. Features not substantially changed since abdomen/pelvis CT of 08/07/2008 and are likely related to prior cholecystectomy. There is no evidence for choledocholithiasis by MRCP today. 2. 2.8 cm simple cyst lower pole right kidney with additional tiny T2 hyperintensities in both kidneys, too small to characterize but likely also cysts. Electronically Signed   By: EMisty StanleyM.D.   On: 09/16/2020 21:38   MR ABDOMEN MRCP W WO CONTAST  Result Date: 09/16/2020 CLINICAL DATA:  Biliary dilatation on CT scan.  Renal cyst. EXAM: MRI ABDOMEN WITHOUT AND WITH CONTRAST (INCLUDING MRCP) TECHNIQUE: Multiplanar multisequence MR imaging of the abdomen was performed both before and after the administration of intravenous contrast. Heavily T2-weighted images of the biliary and pancreatic ducts were obtained, and three-dimensional MRCP images were rendered by post processing. CONTRAST:  184mGADAVIST GADOBUTROL 1 MMOL/ML IV SOLN COMPARISON:  CT scan earlier same day FINDINGS: Lower chest: Unremarkable. Hepatobiliary: No focal abnormality identified within the liver parenchyma. Gallbladder surgically absent. There is mild intrahepatic biliary duct dilatation. Common duct in the hepatoduodenal ligament measures 10-11 mm today, not substantially changed from 9 mm on a CT scan of 08/07/2008. The duct than gradually tapers into the ampulla measuring 7 mm diameter in the head of the pancreas compared to 6 mm on the CT from 2010. MRCP imaging shows no choledocholithiasis. No discernible obstructing lesion in the  head of the pancreas. Pancreas: No focal mass lesion. No dilatation of the main duct. No intraparenchymal cyst. No peripancreatic edema. Spleen:  No splenomegaly. No focal mass lesion. Adrenals/Urinary Tract: No adrenal nodule or mass. 2.8 cm simple cyst noted lower pole right kidney. Additional tiny T2 hyperintensities in both kidneys measure 6 mm less in size, too small to characterize but no discernible enhancement in these are also likely simple cyst. Stomach/Bowel: Stomach is unremarkable. No gastric wall thickening. No evidence of outlet obstruction. Duodenum is normally positioned as is the ligament of Treitz. No small bowel or colonic dilatation within the visualized abdomen. Vascular/Lymphatic: No abdominal aortic aneurysm. No abdominal lymphadenopathy. Other:  No intraperitoneal free fluid. Musculoskeletal: No focal suspicious marrow enhancement within the visualized bony anatomy. IMPRESSION: 1. Mild intra and extrahepatic biliary duct dilatation in this patient status post cholecystectomy. Features not substantially changed since abdomen/pelvis CT of 08/07/2008 and are likely related to prior cholecystectomy. There is no evidence for choledocholithiasis by MRCP today. 2. 2.8 cm simple cyst lower pole right kidney with additional tiny T2 hyperintensities in both kidneys, too small to characterize but likely also cysts. Electronically Signed   By: Misty Stanley M.D.   On: 09/16/2020 21:38    Assessment / Plan:  56.  75 year old female with lower abdominal pain which radiates to the upper abdomen with transaminitis.  CTAP with contrast identified mild intrahepatic and extrahepatic biliary dilatation.  Abdominal MRI/MRCP showed mild intra and extrahepatic biliary ductal dilatation status postcholecystectomy unchanged when compared to prior CT imaging 08/2008, no evidence of choledocholithiasis.  LFTs are drifting downward.  Alk phos 463 -> 394.  Total bili 3.1 -> 1.4. AST 1209 ->493.  ALT 747 -> 563.  Acute  hepatitis panel negative.  Her acute upper abdominal pain with elevated LFTs suggest biliary colic, she likely passed a CBD stone.  Query if her recent Covid 19 status and Paxlovid  contributed to transaminitis.  -Clear liquid diet, if no nausea, vomiting or recurrence of abdominal pain will advanced to a low fat diet -Likely discharge home today -She will need a repeat hepatic panel in one week and follow up in our GI clinic in 3 to 4 weeks. Our office will contact patient to facilitate labs and follow up  2.  Colon cancer screening.  Father with history of colon cancer. -Colonoscopy as outpatient   3.  History of reflux symptoms -Pantoprazole 40 mg p.o. daily   3.  History of diabetes mellitus type 2   4.  History of breast cancer status post bilateral mastectomy  Active Problems:   Type 2 diabetes mellitus with hyperlipidemia (HCC)   Elevated LFTs   HTN (hypertension)     LOS: 0 days   Noralyn Pick  09/17/2020, 8:46 AM

## 2020-09-17 NOTE — Discharge Summary (Signed)
Physician Discharge Summary  Krystal Watson UJW:[id number] DOB: 11/28/1945 DOA: 09/16/2020  PCP: Seward Carol, MD  Admit date: 09/16/2020 Discharge date: 09/17/2020  Admitted From: Home Disposition: Home  Recommendations for Outpatient Follow-up:  Follow up with PCP in 1 week with repeat CBC/CMP Outpatient follow-up with GI Follow up in ED if symptoms worsen or new appear   Home Health: No Equipment/Devices: None  Discharge Condition: Stable CODE STATUS: Full Diet recommendation: Heart healthy/carb modified  Brief/Interim Summary: 75 y.o. female with medical history significant of hypertension, diabetes mellitus type 2, GERD, hyperlipidemia, breast cancer status post bilateral mastectomy, choledocholithiasis requiring ERCP x2 with eventual cholecystectomy, COVID-positive few weeks ago treated with Paxlovid as an outpatient presented with worsening abdominal pain with nausea and vomiting.  She was found to have elevated LFTs with AST of 1209, ALT of 747, total bili of 3.1, alkaline phosphatase of 463.  Lipase was 28. CTAP with contrast identified mild intrahepatic and extrahepatic biliary dilatation post cholecystectomy the CBD measured up to 11 mm in diameter.  The pancreas was normal.  Sigmoid diverticulosis without evidence of diverticulitis.  GI was consulted who recommended MRI/MRCP.  MRCP was negative for choledocholithiasis.  She was treated with IV fluids.  LFTs are improving.  She is currently afebrile and hemodynamically stable.  GI has recommended to advance diet today and if she tolerates diet, she will be discharged home today with outpatient follow-up with PCP and GI.  Discharge Diagnoses:   Abdominal pain with intrahepatic and extrahepatic biliary dilatation seen on CT of the abdomen and pelvis Elevated LFTs -Patient presented with abdominal pain with nausea and vomiting with elevated LFTs and intrahepatic and extrahepatic biliary dilatation on CT of the abdomen and  pelvis. -She was initially started on IV Rocephin as well.  No signs of cholangitis.  Will DC Rocephin. -MRCP was negative for choledocholithiasis.  She was treated with IV fluids.  LFTs are improving.  She is currently afebrile and hemodynamically stable.  GI has recommended to advance diet today and if she tolerates diet, she will be discharged home today with outpatient follow-up with PCP and GI.   Diabetes mellitus type 2 -Carb modified diet.  Resume home regimen.   Hypertension -Resume home regimen.  Hyperlipidemia -Hold statin until LFTs normalize as an outpatient.  Recent COVID-19 infection -Patient was diagnosed with COVID-19 on 08/28/2020 and was treated by PCP as an outpatient with paxlovid.  She has tested for COVID-19 again on admission.  She does not have any respiratory symptoms.  She does not need any isolation.   Discharge Instructions  Discharge Instructions     Ambulatory referral to Gastroenterology   Complete by: As directed    Diet - low sodium heart healthy   Complete by: As directed    Diet general   Complete by: As directed    Increase activity slowly   Complete by: As directed       Allergies as of 09/17/2020       Reactions   Levofloxacin Other (See Comments)   Ankle pain   Sulfonamide Derivatives    Pt can not remember the reaction        Medication List     STOP taking these medications    atorvastatin 40 MG tablet Commonly known as: LIPITOR       TAKE these medications    aspirin 81 MG tablet Take 81 mg by mouth daily.   Calcium Carbonate-Vitamin D 600-400 MG-UNIT tablet Commonly known as: Caltrate 600+D  Take 1 tablet by mouth daily.   colesevelam 625 MG tablet Commonly known as: WELCHOL TAKE 1 TABLET(625 MG) BY MOUTH TWICE DAILY WITH A MEAL What changed:  how much to take how to take this when to take this additional instructions   diltiazem 240 MG 24 hr capsule Commonly known as: DILACOR XR Take 240 mg by mouth  daily.   esomeprazole 20 MG capsule Commonly known as: NEXIUM Take 1 capsule (20 mg total) by mouth every other day.   GAVISCON PO Take 2 tablets by mouth daily as needed (stomach discomfort). For stomach   hydrochlorothiazide 25 MG tablet Commonly known as: HYDRODIURIL Take 25 mg by mouth daily.   ibandronate 150 MG tablet Commonly known as: BONIVA Take 150 mg by mouth every 30 (thirty) days. Take in the morning with a full glass of water, on an empty stomach, and do not take anything else by mouth or lie down for the next 30 min.   Lantus SoloStar 100 UNIT/ML Solostar Pen Generic drug: insulin glargine Inject 20 Units into the skin every morning. And pen needles 1/day.   metFORMIN 500 MG tablet Commonly known as: GLUCOPHAGE TAKE 1 TABLET(500 MG) BY MOUTH TWICE DAILY WITH A MEAL What changed: See the new instructions.   ondansetron 4 MG tablet Commonly known as: ZOFRAN Take 1 tablet (4 mg total) by mouth every 6 (six) hours as needed for nausea.   One-A-Day Weight Smart Advance Tabs Take 1 tablet by mouth daily.   OneTouch Delica Plus [id number] Misc USE AS DIRECTED TO TEST BLOOD SUGAR DAILY   OneTouch Verio test strip Generic drug: glucose blood 1 each by Other route in the morning and at bedtime. And lancets 2/day   Pen Needles 31G X 5 MM Misc Use to inject insulin into skin once daily in the morning.   phenylephrine 10 MG Tabs tablet Commonly known as: SUDAFED PE Take 10 mg by mouth every 4 (four) hours as needed. For allergies   ProAir HFA 108 (90 Base) MCG/ACT inhaler Generic drug: albuterol Inhale 2 puffs into the lungs every 6 (six) hours as needed for wheezing or shortness of breath.   promethazine-dextromethorphan 6.25-15 MG/5ML syrup Commonly known as: PROMETHAZINE-DM Take 5 mLs by mouth every 6 (six) hours as needed for cough.   ramipril 10 MG capsule Commonly known as: ALTACE Take 10 mg by mouth daily.   repaglinide 2 MG tablet Commonly known  as: PRANDIN Take 1 tablet (2 mg total) by mouth 3 (three) times daily before meals.   UNABLE TO FIND Rx: L8000-Post Surgical Bras (Quantity: 6) H4193- Silicone Breast Prosthesis (Quantity: 2) Dx: 174.9; bilateral mastectomies        Follow-up Information     Polite, Ronald, MD. Schedule an appointment as soon as possible for a visit in 1 week(s).   Specialty: Internal Medicine Why: With repeat CBC/CMP Contact information: [address] [place] [address] [phone]                Allergies  Allergen Reactions   Levofloxacin Other (See Comments)    Ankle pain   Sulfonamide Derivatives     Pt can not remember the reaction    Consultations: GI   Procedures/Studies: CT Abdomen Pelvis W Contrast  Result Date: 09/16/2020 CLINICAL DATA:  RIGHT-side abdominal pain, sharp pain radiating to both flanks, constipation EXAM: CT ABDOMEN AND PELVIS WITH CONTRAST TECHNIQUE: Multidetector CT imaging of the abdomen and pelvis was performed using the standard  protocol following bolus administration of intravenous contrast. Sagittal and coronal MPR images reconstructed from axial data set. CONTRAST:  137mL OMNIPAQUE IOHEXOL 300 MG/ML SOLN IV. No oral contrast. COMPARISON:  09/20/2005 FINDINGS: Lower chest: Bibasilar atelectasis Hepatobiliary: Gallbladder surgically absent. Liver unremarkable. Mild intrahepatic and extrahepatic biliary dilatation, CBD up to 11 mm diameter Pancreas: Normal appearance Spleen: Normal appearance Adrenals/Urinary Tract: Two small RIGHT renal cysts, larger at inferior pole 3.0 cm greatest size. Adrenal glands, kidneys, ureters, and bladder otherwise normal appearance Stomach/Bowel: Normal appendix. Scattered normal stool burden. Sigmoid diverticulosis without evidence of diverticulitis. Stomach decompressed. Remaining bowel loops unremarkable. Vascular/Lymphatic: Vascular structures patent. Aorta normal caliber with scattered atherosclerotic  calcifications of aorta, iliac arteries and visceral arteries. No adenopathy. Scattered pelvic phleboliths. Reproductive: Uterus surgically absent.  Atrophic ovaries. Other: No free air or free fluid. No hernia or inflammatory process. Musculoskeletal: Facet degenerative changes lumbar spine. Bones demineralized. IMPRESSION: Sigmoid diverticulosis without evidence of diverticulitis. Mild intrahepatic and extrahepatic biliary dilatation post cholecystectomy, recommend correlation with LFTs. Small RIGHT renal cysts. No acute intra-abdominal or intrapelvic abnormalities otherwise identified. Aortic Atherosclerosis (ICD10-I70.0). Electronically Signed   By: Lavonia Dana M.D.   On: 09/16/2020 14:30   MR 3D Recon At Scanner  Result Date: 09/16/2020 CLINICAL DATA:  Biliary dilatation on CT scan.  Renal cyst. EXAM: MRI ABDOMEN WITHOUT AND WITH CONTRAST (INCLUDING MRCP) TECHNIQUE: Multiplanar multisequence MR imaging of the abdomen was performed both before and after the administration of intravenous contrast. Heavily T2-weighted images of the biliary and pancreatic ducts were obtained, and three-dimensional MRCP images were rendered by post processing. CONTRAST:  65mL GADAVIST GADOBUTROL 1 MMOL/ML IV SOLN COMPARISON:  CT scan earlier same day FINDINGS: Lower chest: Unremarkable. Hepatobiliary: No focal abnormality identified within the liver parenchyma. Gallbladder surgically absent. There is mild intrahepatic biliary duct dilatation. Common duct in the hepatoduodenal ligament measures 10-11 mm today, not substantially changed from 9 mm on a CT scan of 08/07/2008. The duct than gradually tapers into the ampulla measuring 7 mm diameter in the head of the pancreas compared to 6 mm on the CT from 2010. MRCP imaging shows no choledocholithiasis. No discernible obstructing lesion in the head of the pancreas. Pancreas: No focal mass lesion. No dilatation of the main duct. No intraparenchymal cyst. No peripancreatic edema.  Spleen:  No splenomegaly. No focal mass lesion. Adrenals/Urinary Tract: No adrenal nodule or mass. 2.8 cm simple cyst noted lower pole right kidney. Additional tiny T2 hyperintensities in both kidneys measure 6 mm less in size, too small to characterize but no discernible enhancement in these are also likely simple cyst. Stomach/Bowel: Stomach is unremarkable. No gastric wall thickening. No evidence of outlet obstruction. Duodenum is normally positioned as is the ligament of Treitz. No small bowel or colonic dilatation within the visualized abdomen. Vascular/Lymphatic: No abdominal aortic aneurysm. No abdominal lymphadenopathy. Other:  No intraperitoneal free fluid. Musculoskeletal: No focal suspicious marrow enhancement within the visualized bony anatomy. IMPRESSION: 1. Mild intra and extrahepatic biliary duct dilatation in this patient status post cholecystectomy. Features not substantially changed since abdomen/pelvis CT of 08/07/2008 and are likely related to prior cholecystectomy. There is no evidence for choledocholithiasis by MRCP today. 2. 2.8 cm simple cyst lower pole right kidney with additional tiny T2 hyperintensities in both kidneys, too small to characterize but likely also cysts. Electronically Signed   By: Misty Stanley M.D.   On: 09/16/2020 21:38   MR ABDOMEN MRCP W WO CONTAST  Result Date: 09/16/2020 CLINICAL DATA:  Biliary dilatation  on CT scan.  Renal cyst. EXAM: MRI ABDOMEN WITHOUT AND WITH CONTRAST (INCLUDING MRCP) TECHNIQUE: Multiplanar multisequence MR imaging of the abdomen was performed both before and after the administration of intravenous contrast. Heavily T2-weighted images of the biliary and pancreatic ducts were obtained, and three-dimensional MRCP images were rendered by post processing. CONTRAST:  78mL GADAVIST GADOBUTROL 1 MMOL/ML IV SOLN COMPARISON:  CT scan earlier same day FINDINGS: Lower chest: Unremarkable. Hepatobiliary: No focal abnormality identified within the liver  parenchyma. Gallbladder surgically absent. There is mild intrahepatic biliary duct dilatation. Common duct in the hepatoduodenal ligament measures 10-11 mm today, not substantially changed from 9 mm on a CT scan of 08/07/2008. The duct than gradually tapers into the ampulla measuring 7 mm diameter in the head of the pancreas compared to 6 mm on the CT from 2010. MRCP imaging shows no choledocholithiasis. No discernible obstructing lesion in the head of the pancreas. Pancreas: No focal mass lesion. No dilatation of the main duct. No intraparenchymal cyst. No peripancreatic edema. Spleen:  No splenomegaly. No focal mass lesion. Adrenals/Urinary Tract: No adrenal nodule or mass. 2.8 cm simple cyst noted lower pole right kidney. Additional tiny T2 hyperintensities in both kidneys measure 6 mm less in size, too small to characterize but no discernible enhancement in these are also likely simple cyst. Stomach/Bowel: Stomach is unremarkable. No gastric wall thickening. No evidence of outlet obstruction. Duodenum is normally positioned as is the ligament of Treitz. No small bowel or colonic dilatation within the visualized abdomen. Vascular/Lymphatic: No abdominal aortic aneurysm. No abdominal lymphadenopathy. Other:  No intraperitoneal free fluid. Musculoskeletal: No focal suspicious marrow enhancement within the visualized bony anatomy. IMPRESSION: 1. Mild intra and extrahepatic biliary duct dilatation in this patient status post cholecystectomy. Features not substantially changed since abdomen/pelvis CT of 08/07/2008 and are likely related to prior cholecystectomy. There is no evidence for choledocholithiasis by MRCP today. 2. 2.8 cm simple cyst lower pole right kidney with additional tiny T2 hyperintensities in both kidneys, too small to characterize but likely also cysts. Electronically Signed   By: Misty Stanley M.D.   On: 09/16/2020 21:38      Subjective: Patient seen and examined at bedside.  She denies any  current abdominal pain, nausea or vomiting.  Feels hungry.  Discharge Exam: Vitals:   09/17/20 0530 09/17/20 1005  BP: 123/72 135/68  Pulse: 80 61  Resp: 18 17  Temp: 98.7 F (37.1 C) 98.7 F (37.1 C)  SpO2: 97% 98%    General: Pt is alert, awake, not in acute distress.  Currently on room air. Cardiovascular: rate controlled, S1/S2 + Respiratory: bilateral decreased breath sounds at bases Abdominal: Soft, obese, NT, ND, bowel sounds + Extremities: no edema, no cyanosis    The results of significant diagnostics from this hospitalization (including imaging, microbiology, ancillary and laboratory) are listed below for reference.     Microbiology: Recent Results (from the past 240 hour(s))  SARS CORONAVIRUS 2 (TAT 6-24 HRS) Nasopharyngeal Nasopharyngeal Swab     Status: Abnormal   Collection Time: 09/16/20  4:10 PM   Specimen: Nasopharyngeal Swab  Result Value Ref Range Status   SARS Coronavirus 2 POSITIVE (A) NEGATIVE Final    Comment: (NOTE) SARS-CoV-2 target nucleic acids are DETECTED.  The SARS-CoV-2 RNA is generally detectable in upper and lower respiratory specimens during the acute phase of infection. Positive results are indicative of the presence of SARS-CoV-2 RNA. Clinical correlation with patient history and other diagnostic information is  necessary to determine  patient infection status. Positive results do not rule out bacterial infection or co-infection with other viruses.  The expected result is Negative.  Fact Sheet for Patients: SugarRoll.be  Fact Sheet for Healthcare Providers: https://www.woods-mathews.com/  This test is not yet approved or cleared by the [place] FDA and  has been authorized for detection and/or diagnosis of SARS-CoV-2 by FDA under an Emergency Use Authorization (EUA). This EUA will remain  in effect (meaning this test can be used) for the duration of the COVID-19 declaration under  Section 564(b)(1) of the Act, 21 U. S.C. section 360bbb-3(b)(1), unless the authorization is terminated or revoked sooner.   Performed at [facility], [place] [address] [place], [place] [address]      Labs: BNP (last 3 results) No results for input(s): BNP in the last 8760 hours. Basic Metabolic Panel: Recent Labs  Lab 09/16/20 1256 09/17/20 0322  NA 135 141  K 3.7 3.6  CL 96* 105  CO2 28 28  GLUCOSE 204* 218*  BUN 11 12  CREATININE 0.54 0.64  CALCIUM 9.4 9.2   Liver Function Tests: Recent Labs  Lab 09/16/20 1256 09/17/20 0322  AST 1,209* 493*  ALT 747* 563*  ALKPHOS 463* 394*  BILITOT 3.1* 1.4*  PROT 7.7 6.8  ALBUMIN 3.9 3.5   Recent Labs  Lab 09/16/20 1256  LIPASE 28   No results for input(s): AMMONIA in the last 168 hours. CBC: Recent Labs  Lab 09/16/20 1101 09/17/20 0322  WBC 13.3* 7.8  HGB 14.3 12.6  HCT 44.9 39.8  MCV 96.8 97.1  PLT 229 243   Cardiac Enzymes: No results for input(s): CKTOTAL, CKMB, CKMBINDEX, TROPONINI in the last 168 hours. BNP: Invalid input(s): POCBNP CBG: Recent Labs  Lab 09/16/20 2157 09/17/20 0727  GLUCAP 134* 140*   D-Dimer No results for input(s): DDIMER in the last 72 hours. Hgb A1c No results for input(s): HGBA1C in the last 72 hours. Lipid Profile No results for input(s): CHOL, HDL, LDLCALC, TRIG, CHOLHDL, LDLDIRECT in the last 72 hours. Thyroid function studies No results for input(s): TSH, T4TOTAL, T3FREE, THYROIDAB in the last 72 hours.  Invalid input(s): FREET3 Anemia work up No results for input(s): VITAMINB12, FOLATE, FERRITIN, TIBC, IRON, RETICCTPCT in the last 72 hours. Urinalysis    Component Value Date/Time   COLORURINE YELLOW (A) 09/16/2020 1146   APPEARANCEUR HAZY (A) 09/16/2020 1146   LABSPEC 1.025 09/16/2020 1146   PHURINE 7.0 09/16/2020 1146   GLUCOSEU 1,000 (A) 09/16/2020 1146   HGBUR NEGATIVE 09/16/2020 1146   BILIRUBINUR SMALL (A) 09/16/2020 1146   KETONESUR NEGATIVE  09/16/2020 1146   PROTEINUR 100 (A) 09/16/2020 1146   UROBILINOGEN 1.0 09/08/2014 0429   NITRITE POSITIVE (A) 09/16/2020 1146   LEUKOCYTESUR NEGATIVE 09/16/2020 1146   Sepsis Labs Invalid input(s): PROCALCITONIN,  WBC,  LACTICIDVEN Microbiology Recent Results (from the past 240 hour(s))  SARS CORONAVIRUS 2 (TAT 6-24 HRS) Nasopharyngeal Nasopharyngeal Swab     Status: Abnormal   Collection Time: 09/16/20  4:10 PM   Specimen: Nasopharyngeal Swab  Result Value Ref Range Status   SARS Coronavirus 2 POSITIVE (A) NEGATIVE Final    Comment: (NOTE) SARS-CoV-2 target nucleic acids are DETECTED.  The SARS-CoV-2 RNA is generally detectable in upper and lower respiratory specimens during the acute phase of infection. Positive results are indicative of the presence of SARS-CoV-2 RNA. Clinical correlation with patient history and other diagnostic information is  necessary to determine patient infection status. Positive results do not rule  out bacterial infection or co-infection with other viruses.  The expected result is Negative.  Fact Sheet for Patients: SugarRoll.be  Fact Sheet for Healthcare Providers: https://www.woods-mathews.com/  This test is not yet approved or cleared by the [place] FDA and  has been authorized for detection and/or diagnosis of SARS-CoV-2 by FDA under an Emergency Use Authorization (EUA). This EUA will remain  in effect (meaning this test can be used) for the duration of the COVID-19 declaration under Section 564(b)(1) of the Act, 21 U. S.C. section 360bbb-3(b)(1), unless the authorization is terminated or revoked sooner.   Performed at [facility], [place] [address] [place], [place] [address]      Time coordinating discharge: 35 minutes  SIGNED:   Aline August, MD  [facility] 09/17/2020, 11:13 AM

## 2020-09-17 NOTE — Plan of Care (Signed)
All discharge instructions were given to the Pt. All questions were answered.

## 2020-09-18 LAB — HEMOGLOBIN A1C
Hgb A1c MFr Bld: 8.1 % — ABNORMAL HIGH (ref 4.8–5.6)
Mean Plasma Glucose: 186 mg/dL

## 2020-09-19 LAB — URINE CULTURE: Culture: >=100000 — AB

## 2020-09-20 ENCOUNTER — Telehealth: Payer: Self-pay

## 2020-09-20 ENCOUNTER — Other Ambulatory Visit: Payer: Self-pay

## 2020-09-20 DIAGNOSIS — R1013 Epigastric pain: Secondary | ICD-10-CM

## 2020-09-20 DIAGNOSIS — R7989 Other specified abnormal findings of blood chemistry: Secondary | ICD-10-CM

## 2020-09-20 DIAGNOSIS — C50912 Malignant neoplasm of unspecified site of left female breast: Secondary | ICD-10-CM | POA: Diagnosis not present

## 2020-09-20 DIAGNOSIS — C50911 Malignant neoplasm of unspecified site of right female breast: Secondary | ICD-10-CM | POA: Diagnosis not present

## 2020-09-20 NOTE — Telephone Encounter (Signed)
-----   Message from Noralyn Pick, NP sent at 09/17/2020  1:55 PM EDT ----- Krystal Watson, patient to be discharged home today, pls contact her to have a hepatic panel, BMP and CBC in one week. She already had an appt scheduled with Nevin Bloodgood on 7/1 and she should keep that appt.   THX

## 2020-09-20 NOTE — Telephone Encounter (Signed)
Contacted the patient. She agrees to come to [place] for her labs on 09/24/20.

## 2020-09-24 ENCOUNTER — Other Ambulatory Visit: Payer: Medicare PPO

## 2020-09-24 DIAGNOSIS — R1084 Generalized abdominal pain: Secondary | ICD-10-CM | POA: Diagnosis not present

## 2020-09-24 DIAGNOSIS — R7989 Other specified abnormal findings of blood chemistry: Secondary | ICD-10-CM | POA: Diagnosis not present

## 2020-09-28 DIAGNOSIS — C50912 Malignant neoplasm of unspecified site of left female breast: Secondary | ICD-10-CM | POA: Diagnosis not present

## 2020-09-28 DIAGNOSIS — C50911 Malignant neoplasm of unspecified site of right female breast: Secondary | ICD-10-CM | POA: Diagnosis not present

## 2020-10-01 ENCOUNTER — Encounter: Payer: Self-pay | Admitting: Nurse Practitioner

## 2020-10-01 ENCOUNTER — Ambulatory Visit (INDEPENDENT_AMBULATORY_CARE_PROVIDER_SITE_OTHER): Payer: Medicare PPO | Admitting: Nurse Practitioner

## 2020-10-01 VITALS — BP 114/62 | Ht 62.0 in | Wt 204.0 lb

## 2020-10-01 DIAGNOSIS — Z1211 Encounter for screening for malignant neoplasm of colon: Secondary | ICD-10-CM | POA: Diagnosis not present

## 2020-10-01 DIAGNOSIS — K5900 Constipation, unspecified: Secondary | ICD-10-CM

## 2020-10-01 DIAGNOSIS — K219 Gastro-esophageal reflux disease without esophagitis: Secondary | ICD-10-CM

## 2020-10-01 DIAGNOSIS — R7989 Other specified abnormal findings of blood chemistry: Secondary | ICD-10-CM | POA: Diagnosis not present

## 2020-10-01 MED ORDER — NA SULFATE-K SULFATE-MG SULF 17.5-3.13-1.6 GM/177ML PO SOLN: 1.0000 | Freq: Once | ORAL | 0 refills | Status: AC

## 2020-10-01 NOTE — Patient Instructions (Signed)
Continue Miralax as needed.  You have been scheduled for a colonoscopy. Please follow written instructions given to you at your visit today.  Please pick up your prep supplies at the pharmacy within the next 1-3 days. If you use inhalers (even only as needed), please bring them with you on the day of your procedure.  If you are age 73 or older, your body mass index should be between 23-30. Your Body mass index is 37.31 kg/m. If this is out of the aforementioned range listed, please consider follow up with your Primary Care Provider.  If you are age 60 or younger, your body mass index should be between 19-25. Your Body mass index is 37.31 kg/m. If this is out of the aformentioned range listed, please consider follow up with your Primary Care Provider.   __________________________________________________________  The [facility] providers would like to encourage you to use [facility] to communicate with providers for non-urgent requests or questions.  Due to long hold times on the telephone, sending your provider a message by [facility] may be a faster and more efficient way to get a response.  Please allow 48 business hours for a response.  Please remember that this is for non-urgent requests.

## 2020-10-01 NOTE — Progress Notes (Signed)
ASSESSMENT AND PLAN    # 75 yo female recently hospitalized with abdominal pain and markedly abnormal liver chemistries. She has a remote history of choledocholithiasis x2 and suspicion was for a recurrence. However, MRCP didn't suggest presence of bile duct stone ( or obstructive process). Her abdominal pain promptly resolved and liver chemistries improved. Suspicion was for a passed stone.  --Get got follow up labs with PCP earlier this week, will request those results.   # Bowel changes with new constipation over the last month. . She takes Welchol but has been taking it for months without bowel problems and the ose was recently reduced. She cannot correlate constipation to any other medication changes though she is taking several medications which can cause constipation such as diuretics, CCB, Ca+ supplements  --Continue Miralax as needed. Continue with good hydration. She is due for 10 year screening colonoscopy. In light of bowel changes will proceed earlier  # GERD, symptoms controlled with Nexium QOD   HISTORY OF PRESENT ILLNESS     Chief Complaint : constipation  Mel Tadros is a 75 y.o. female , known to Dr. Henrene Pastor. She has a past medical history significant for HTN, obesity, hyperlipidemia, arthritis, DM2, breast cancer s/p bilateral mastectomy,remote  remote choledocholithiatic requiring two ERCPs, cholecystectomy,  diverticulosis, COVID infection, . See PMH below for any additional history.    Patient was hospitalized two weeks ago for abdominal pain, leukocytosis, markedly abnormal liver chemistries.   Acute hepatitis panel was negative. CTAP with contrast identified mild intrahepatic and extrahepatic biliary dilatation post cholecystectomy. The CBD measured up to 11 mm in diameter.  The pancreas was normal.  She has a history of choledocholithasis and we had concern for a recurrence but MRCP showed only mild intra / extra hepatic biliary duct dilation in set of a  cholecystectomy.  No choledocholithiasis. Her abdominal pain subsided, it was suspected that she passed a gallstone. Acute hepatitis panel was negative.   Patient says she had labs with PCP this past Monday but I don't have results.   Ms Hakim hasn't had any further abdominal pain. Her only complaint is that of new constipation over the last month. Stools vary between normal consistency and hard requiring her to strain at times. She hasn't started any new medications. Statin recently discontinued. She was taking two Welchol tablets but this was reduced to once daily. She never didn't have any problems with constipation on Welchol. She has been careful to drink plenty of water She takes take Miralax about once a week and collagen in her coffee helps.      PREVIOUS EVALUATIONS:   Colonoscopy 06/13/2011: Mild diverticulosis in the sigmoid colon No polyps Recall colonoscopy in 10 years    Past Medical History:  Diagnosis Date   Allergy    sulfa   Arthritis    Cancer ([place])    breast   Chronic pain    Diabetes mellitus    Diverticulosis    Gallstones    GERD (gastroesophageal reflux disease)    Hyperlipidemia    Hypertension    Obesity    Shortness of breath    with exertion     Past Surgical History:  Procedure Laterality Date   ABDOMINAL HYSTERECTOMY     CHOLECYSTECTOMY     COLONOSCOPY     ERCP     Hx 2x   SIMPLE MASTECTOMY WITH AXILLARY SENTINEL NODE BIOPSY  04/24/2012   Procedure: SIMPLE MASTECTOMY WITH AXILLARY SENTINEL NODE BIOPSY;  Surgeon: Haywood Lasso, MD;  Location: [facility] OR;  Service: General;  Laterality: Bilateral;  Bilateral total mastectomy and bilateral sentinel node   Family History  Problem Relation Age of Onset   Colon cancer Father 22   Diabetes Mother    Heart disease Mother    Breast cancer Sister    Esophageal cancer Neg Hx    Stomach cancer Neg Hx    Rectal cancer Neg Hx    Social History   Tobacco Use   Smoking status: Never   Smokeless  tobacco: Never  Vaping Use   Vaping Use: Never used  Substance Use Topics   Alcohol use: No    Comment: rare   Drug use: No   Current Outpatient Medications  Medication Sig Dispense Refill   Alum Hydroxide-Mag Carbonate (GAVISCON PO) Take 2 tablets by mouth daily as needed (stomach discomfort). For stomach     aspirin 81 MG tablet Take 81 mg by mouth daily.     Calcium Carbonate-Vitamin D (CALTRATE 600+D) 600-400 MG-UNIT tablet Take 1 tablet by mouth daily.     colesevelam (WELCHOL) 625 MG tablet TAKE 1 TABLET(625 MG) BY MOUTH TWICE DAILY WITH A MEAL 90 tablet 3   diltiazem (DILACOR XR) 240 MG 24 hr capsule Take 240 mg by mouth daily.     esomeprazole (NEXIUM) 20 MG capsule Take 1 capsule (20 mg total) by mouth every other day.     glucose blood (ONETOUCH VERIO) test strip 1 each by Other route in the morning and at bedtime. And lancets 2/day 200 strip 3   hydrochlorothiazide (HYDRODIURIL) 25 MG tablet Take 25 mg by mouth daily.     ibandronate (BONIVA) 150 MG tablet Take 150 mg by mouth every 30 (thirty) days. Take in the morning with a full glass of water, on an empty stomach, and do not take anything else by mouth or lie down for the next 30 min.     insulin glargine (LANTUS SOLOSTAR) 100 UNIT/ML Solostar Pen Inject 20 Units into the skin every morning. And pen needles 1/day. 15 mL 0   Insulin Pen Needle (PEN NEEDLES) 31G X 5 MM MISC Use to inject insulin into skin once daily in the morning. 100 each 12   Lancets (ONETOUCH DELICA PLUS [id number]) MISC USE AS DIRECTED TO TEST BLOOD SUGAR DAILY 100 each 0   metFORMIN (GLUCOPHAGE) 500 MG tablet TAKE 1 TABLET(500 MG) BY MOUTH TWICE DAILY WITH A MEAL 180 tablet 3   Multiple Vitamins-Minerals (ONE-A-DAY WEIGHT SMART ADVANCE) TABS Take 1 tablet by mouth daily.     ondansetron (ZOFRAN) 4 MG tablet Take 1 tablet (4 mg total) by mouth every 6 (six) hours as needed for nausea. 20 tablet 0   phenylephrine (SUDAFED PE) 10 MG TABS Take 10 mg by mouth  every 4 (four) hours as needed. For allergies     PROAIR HFA 108 (90 BASE) MCG/ACT inhaler Inhale 2 puffs into the lungs every 6 (six) hours as needed for wheezing or shortness of breath.  0   promethazine-dextromethorphan (PROMETHAZINE-DM) 6.25-15 MG/5ML syrup Take 5 mLs by mouth every 6 (six) hours as needed for cough.     ramipril (ALTACE) 10 MG capsule Take 10 mg by mouth daily.     repaglinide (PRANDIN) 2 MG tablet Take 1 tablet (2 mg total) by mouth 3 (three) times daily before meals. 270 tablet 3   UNABLE TO FIND Rx: L8000-Post Surgical Bras (Quantity: 6) U7253- Silicone Breast Prosthesis (Quantity: 2) Dx:  174.9; bilateral mastectomies 1 each 0   No current facility-administered medications for this visit.   Allergies  Allergen Reactions   Levofloxacin Other (See Comments)    Ankle pain   Sulfonamide Derivatives     Pt can not remember the reaction     Review of Systems: Negative for chest pain, SOB, urinary symptoms.     PHYSICAL EXAM :    Wt Readings from Last 3 Encounters:  10/01/20 204 lb (92.5 kg)  09/16/20 206 lb (93.4 kg)  06/24/20 207 lb 3.2 oz (94 kg)    BP 114/62   Ht 5\' 2"  (1.575 m)   Wt 204 lb (92.5 kg)   BMI 37.31 kg/m  Constitutional:  Pleasant female in no acute distress. Psychiatric: Normal mood and affect. Behavior is normal. EENT: Pupils normal.  Conjunctivae are normal. No scleral icterus. Neck supple.  Cardiovascular: Normal rate, regular rhythm. No edema Pulmonary/chest: Effort normal and breath sounds normal. No wheezing, rales or rhonchi. Abdominal: Soft, nondistended, nontender. Bowel sounds active throughout. There are no masses palpable. No hepatomegaly. Neurological: Alert and oriented to person place and time. Skin: Skin is warm and dry. No rashes noted.  Tye Savoy, NP  10/01/2020, 3:01 PM

## 2020-10-04 ENCOUNTER — Encounter: Payer: Self-pay | Admitting: Nurse Practitioner

## 2020-10-04 NOTE — Progress Notes (Signed)
Noted  

## 2020-10-12 ENCOUNTER — Encounter: Payer: Self-pay | Admitting: Endocrinology

## 2020-10-25 ENCOUNTER — Ambulatory Visit (INDEPENDENT_AMBULATORY_CARE_PROVIDER_SITE_OTHER): Payer: Medicare PPO | Admitting: Endocrinology

## 2020-10-25 ENCOUNTER — Other Ambulatory Visit: Payer: Self-pay | Admitting: Endocrinology

## 2020-10-25 ENCOUNTER — Other Ambulatory Visit: Payer: Self-pay

## 2020-10-25 VITALS — BP 118/70 | HR 69 | Ht 62.0 in | Wt 203.6 lb

## 2020-10-25 DIAGNOSIS — E1129 Type 2 diabetes mellitus with other diabetic kidney complication: Secondary | ICD-10-CM | POA: Diagnosis not present

## 2020-10-25 DIAGNOSIS — E1169 Type 2 diabetes mellitus with other specified complication: Secondary | ICD-10-CM

## 2020-10-25 DIAGNOSIS — R809 Proteinuria, unspecified: Secondary | ICD-10-CM

## 2020-10-25 DIAGNOSIS — E785 Hyperlipidemia, unspecified: Secondary | ICD-10-CM | POA: Diagnosis not present

## 2020-10-25 NOTE — Patient Instructions (Addendum)
I have sent a prescription to your pharmacy, to increase the Lantus to 24 units each morning, and continue the same other 3 diabetes medications.  check your blood sugar once a day.  vary the time of day when you check, between before the 3 meals, and at bedtime.  also check if you have symptoms of your blood sugar being too high or too low.  please keep a record of the readings and bring it to your next appointment here (or you can bring the meter itself).  You can write it on any piece of paper.  please call us sooner if your blood sugar goes below 70, or if you have a lot of readings over 200.  Please come back for a follow-up appointment in 3 months.

## 2020-10-25 NOTE — Progress Notes (Signed)
Subjective:    Patient ID: Krystal Watson, female    DOB: 11-14-1945, 75 y.o.   MRN: [id number]  HPI Pt returns for f/u of diabetes mellitus:  DM type: Insulin-requiring type 2 Dx'ed: [id number]  Complications: DN Therapy: insulin since 2021, and 4 oral meds.  GDM: never.  DKA: never.  Severe hypoglycemia: never.  Pancreatitis: never.  Pancreatic imaging: normal on 2010 CT  SDOH: she declined Januvia, Rybelsus, and Onglyza, due to cost; she chose to d/c acarbose.   Other: she declines weight loss surgery: she did not tolerate pioglitazone (edema); she declines multiple daily injections. she did not tolerate Jardiance or bromocriptine (vaginitis) Interval history: Meter is downloaded today, and the printout is scanned into the record.  cbg varies from 131-324.  She checks fasting only.  She takes meds as rx'ed, except she has not recently.  Lantus was increased to 50 units qd, due to steroids.  Since then, she has reduced it back to 20 units qam, 2 weeks ago.   Past Medical History:  Diagnosis Date   Allergy    sulfa   Arthritis    Cancer ([place])    breast   Chronic pain    Diabetes mellitus    Diverticulosis    Gallstones    GERD (gastroesophageal reflux disease)    Hyperlipidemia    Hypertension    Obesity    Shortness of breath    with exertion    Past Surgical History:  Procedure Laterality Date   ABDOMINAL HYSTERECTOMY     CHOLECYSTECTOMY     COLONOSCOPY     ERCP     Hx 2x   SIMPLE MASTECTOMY WITH AXILLARY SENTINEL NODE BIOPSY  04/24/2012   Procedure: SIMPLE MASTECTOMY WITH AXILLARY SENTINEL NODE BIOPSY;  Surgeon: Haywood Lasso, MD;  Location: [facility] OR;  Service: General;  Laterality: Bilateral;  Bilateral total mastectomy and bilateral sentinel node    Social History   Socioeconomic History   Marital status: Married    Spouse name: Not on file   Number of children: 2   Years of education: Not on file   Highest education level: Not on file  Occupational  History   Occupation: Teacher  Tobacco Use   Smoking status: Never   Smokeless tobacco: Never  Vaping Use   Vaping Use: Never used  Substance and Sexual Activity   Alcohol use: No    Comment: rare   Drug use: No   Sexual activity: Yes  Other Topics Concern   Not on file  Social History Narrative   Not on file   Social Determinants of Health   Financial Resource Strain: Not on file  Food Insecurity: Not on file  Transportation Needs: Not on file  Physical Activity: Not on file  Stress: Not on file  Social Connections: Not on file  Intimate Partner Violence: Not on file    Current Outpatient Medications on File Prior to Visit  Medication Sig Dispense Refill   Alum Hydroxide-Mag Carbonate (GAVISCON PO) Take 2 tablets by mouth daily as needed (stomach discomfort). For stomach     aspirin 81 MG tablet Take 81 mg by mouth daily.     Calcium Carbonate-Vitamin D (CALTRATE 600+D) 600-400 MG-UNIT tablet Take 1 tablet by mouth daily.     colesevelam (WELCHOL) 625 MG tablet TAKE 1 TABLET(625 MG) BY MOUTH TWICE DAILY WITH A MEAL 90 tablet 3   diltiazem (DILACOR XR) 240 MG 24 hr capsule Take 240 mg by mouth  daily.     esomeprazole (NEXIUM) 20 MG capsule Take 1 capsule (20 mg total) by mouth every other day.     glucose blood (ONETOUCH VERIO) test strip 1 each by Other route in the morning and at bedtime. And lancets 2/day 200 strip 3   hydrochlorothiazide (HYDRODIURIL) 25 MG tablet Take 25 mg by mouth daily.     ibandronate (BONIVA) 150 MG tablet Take 150 mg by mouth every 30 (thirty) days. Take in the morning with a full glass of water, on an empty stomach, and do not take anything else by mouth or lie down for the next 30 min.     insulin glargine (LANTUS SOLOSTAR) 100 UNIT/ML Solostar Pen Inject 20 Units into the skin every morning. And pen needles 1/day. 15 mL 0   Lancets (ONETOUCH DELICA PLUS [id number]) MISC USE AS DIRECTED TO TEST BLOOD SUGAR DAILY 100 each 0   metFORMIN (GLUCOPHAGE)  500 MG tablet TAKE 1 TABLET(500 MG) BY MOUTH TWICE DAILY WITH A MEAL 180 tablet 3   Multiple Vitamins-Minerals (ONE-A-DAY WEIGHT SMART ADVANCE) TABS Take 1 tablet by mouth daily.     ondansetron (ZOFRAN) 4 MG tablet Take 1 tablet (4 mg total) by mouth every 6 (six) hours as needed for nausea. 20 tablet 0   phenylephrine (SUDAFED PE) 10 MG TABS Take 10 mg by mouth every 4 (four) hours as needed. For allergies     PROAIR HFA 108 (90 BASE) MCG/ACT inhaler Inhale 2 puffs into the lungs every 6 (six) hours as needed for wheezing or shortness of breath.  0   promethazine-dextromethorphan (PROMETHAZINE-DM) 6.25-15 MG/5ML syrup Take 5 mLs by mouth every 6 (six) hours as needed for cough.     ramipril (ALTACE) 10 MG capsule Take 10 mg by mouth daily.     repaglinide (PRANDIN) 2 MG tablet Take 1 tablet (2 mg total) by mouth 3 (three) times daily before meals. 270 tablet 3   UNABLE TO FIND Rx: L8000-Post Surgical Bras (Quantity: 6) [id number]- Silicone Breast Prosthesis (Quantity: 2) Dx: 174.9; bilateral mastectomies 1 each 0   No current facility-administered medications on file prior to visit.    Allergies  Allergen Reactions   Levofloxacin Other (See Comments)    Ankle pain   Sulfonamide Derivatives     Pt can not remember the reaction    Family History  Problem Relation Age of Onset   Colon cancer Father 48   Diabetes Mother    Heart disease Mother    Breast cancer Sister    Esophageal cancer Neg Hx    Stomach cancer Neg Hx    Rectal cancer Neg Hx     BP 118/70 (BP Location: Right Arm, Patient Position: Sitting, Cuff Size: Large)   Pulse 69   Ht '5\' 2"'$  (1.575 m)   Wt 203 lb 9.6 oz (92.4 kg)   SpO2 96%   BMI 37.24 kg/m    Review of Systems     Objective:   Physical Exam Pulses: dorsalis pedis intact bilat.   MSK: no deformity of the feet.   CV: trace bilat leg edema.   Skin:  no ulcer on the feet.  normal color and temp on the feet.    Neuro: sensation is intact to touch on the  feet.    Lab Results  Component Value Date   HGBA1C 8.1 (H) 09/17/2020   Lab Results  Component Value Date   CREATININE 0.64 09/17/2020   BUN 12 09/17/2020   NA  141 09/17/2020   K 3.6 09/17/2020   CL 105 09/17/2020   CO2 28 09/17/2020      Assessment & Plan:  Insulin-requiring type 2 DM: uncontrolled.    Patient Instructions  I have sent a prescription to your pharmacy, to increase the Lantus to 24 units each morning, and continue the same other 3 diabetes medications.  check your blood sugar once a day.  vary the time of day when you check, between before the 3 meals, and at bedtime.  also check if you have symptoms of your blood sugar being too high or too low.  please keep a record of the readings and bring it to your next appointment here (or you can bring the meter itself).  You can write it on any piece of paper.  please call us sooner if your blood sugar goes below 70, or if you have a lot of readings over 200.  Please come back for a follow-up appointment in 3 months.

## 2020-10-27 DIAGNOSIS — Z20822 Contact with and (suspected) exposure to covid-19: Secondary | ICD-10-CM | POA: Diagnosis not present

## 2020-11-12 DIAGNOSIS — Z20822 Contact with and (suspected) exposure to covid-19: Secondary | ICD-10-CM | POA: Diagnosis not present

## 2020-11-16 ENCOUNTER — Ambulatory Visit (AMBULATORY_SURGERY_CENTER): Payer: Medicare PPO | Admitting: Internal Medicine

## 2020-11-16 ENCOUNTER — Encounter: Payer: Self-pay | Admitting: Internal Medicine

## 2020-11-16 VITALS — BP 96/45 | HR 66 | Temp 97.6°F | Resp 16 | Ht 62.0 in | Wt 204.0 lb

## 2020-11-16 DIAGNOSIS — Z1211 Encounter for screening for malignant neoplasm of colon: Secondary | ICD-10-CM | POA: Diagnosis not present

## 2020-11-16 DIAGNOSIS — K5900 Constipation, unspecified: Secondary | ICD-10-CM | POA: Diagnosis not present

## 2020-11-16 DIAGNOSIS — K5730 Diverticulosis of large intestine without perforation or abscess without bleeding: Secondary | ICD-10-CM | POA: Diagnosis not present

## 2020-11-16 DIAGNOSIS — E119 Type 2 diabetes mellitus without complications: Secondary | ICD-10-CM | POA: Diagnosis not present

## 2020-11-16 DIAGNOSIS — I10 Essential (primary) hypertension: Secondary | ICD-10-CM | POA: Diagnosis not present

## 2020-11-16 MED ORDER — SODIUM CHLORIDE 0.9 % IV SOLN: 500.0000 mL | Freq: Once | INTRAVENOUS | Status: DC

## 2020-11-16 NOTE — Op Note (Signed)
[place] Patient Name: Krystal Watson Procedure Date: 11/16/2020 8:06 AM MRN: [id number] Endoscopist: Docia Chuck. Henrene Pastor , MD Age: 75 Referring MD:  Date of Birth: 01-14-1946 Gender: Female Account #: [id number] Procedure:                Colonoscopy Indications:              Screening in patient at increased risk: Colorectal                            cancer in father 59 or older, Incidental -                            Constipation. Prior exam 06-2011 Medicines:                Monitored Anesthesia Care Procedure:                Pre-Anesthesia Assessment:                           - Prior to the procedure, a History and Physical                            was performed, and patient medications and                            allergies were reviewed. The patient's tolerance of                            previous anesthesia was also reviewed. The risks                            and benefits of the procedure and the sedation                            options and risks were discussed with the patient.                            All questions were answered, and informed consent                            was obtained. Prior Anticoagulants: The patient has                            taken no previous anticoagulant or antiplatelet                            agents. After reviewing the risks and benefits, the                            patient was deemed in satisfactory condition to                            undergo the procedure.  After obtaining informed consent, the colonoscope                            was passed under direct vision. Throughout the                            procedure, the patient's blood pressure, pulse, and                            oxygen saturations were monitored continuously. The                            CF [id number] was introduced through the anus                            and advanced to the the cecum, identified by                             appendiceal orifice and ileocecal valve. The                            ileocecal valve, appendiceal orifice, and rectum                            were photographed. The quality of the bowel                            preparation was excellent. The colonoscopy was                            performed without difficulty. The patient tolerated                            the procedure well. The bowel preparation used was                            SUPREP via split dose instruction. Scope In: 8:32:54 AM Scope Out: 8:48:40 AM Scope Withdrawal Time: 0 hours 12 minutes 31 seconds  Total Procedure Duration: 0 hours 15 minutes 46 seconds  Findings:                 Diverticula were found in the sigmoid colon.                           The exam was otherwise without abnormality on                            direct and retroflexion views. Complications:            No immediate complications. Estimated blood loss:                            None. Estimated Blood Loss:     Estimated blood loss: none. Impression:               -  Diverticulosis in the sigmoid colon.                           - The examination was otherwise normal on direct                            and retroflexion views.                           - No specimens collected. Recommendation:           - Repeat colonoscopy is not recommended for                            screening purposes.                           - Patient has a contact number available for                            emergencies. The signs and symptoms of potential                            delayed complications were discussed with the                            patient. Return to normal activities tomorrow.                            Written discharge instructions were provided to the                            patient.                           - Resume previous diet.                           - Continue present medications. Docia Chuck. Henrene Pastor,  MD 11/16/2020 9:06:54 AM This report has been signed electronically.

## 2020-11-16 NOTE — Progress Notes (Signed)
900- pt c/o abdominal pain, rating as a "10/10".  Pt still drowsy and cannot stay awake to follow directions to pass air.  Abdomen soft and easily palpable  907- passing large amt of air, rating pain "9/10"  910- pt up to the bathroom to sit on toilet.  She is passing large amount of air and did vomit small amount of yellow bile looking material.  Rating pain "5/10" after vomiting  915- back to stretcher, awaiting D.r Henrene Pastor to assess prior to discharge, rating pain "4/10" now.  925- pain "3/10" now.  Abdomen soft and easily palpable.  Awaiting Dr. Henrene Pastor  944- ok to discharge per Dr. Henrene Pastor after he saw her one more time

## 2020-11-16 NOTE — Progress Notes (Signed)
VS by CW  Pt's states no medical or surgical changes since previsit or office visit.  

## 2020-11-16 NOTE — Patient Instructions (Signed)
No polyps today!!!  No further colonoscopies unless any issues  Please read over handout about diverticulosis  Continue your normal medications    YOU HAD AN ENDOSCOPIC PROCEDURE TODAY AT [facility]:   Refer to the procedure report that was given to you for any specific questions about what was found during the examination.  If the procedure report does not answer your questions, please call your gastroenterologist to clarify.  If you requested that your care partner not be given the details of your procedure findings, then the procedure report has been included in a sealed envelope for you to review at your convenience later.  YOU SHOULD EXPECT: Some feelings of bloating in the abdomen. Passage of more gas than usual.  Walking can help get rid of the air that was put into your GI tract during the procedure and reduce the bloating. If you had a lower endoscopy (such as a colonoscopy or flexible sigmoidoscopy) you may notice spotting of blood in your stool or on the toilet paper. If you underwent a bowel prep for your procedure, you may not have a normal bowel movement for a few days.  Please Note:  You might notice some irritation and congestion in your nose or some drainage.  This is from the oxygen used during your procedure.  There is no need for concern and it should clear up in a day or so.  SYMPTOMS TO REPORT IMMEDIATELY:  Following lower endoscopy (colonoscopy or flexible sigmoidoscopy):  Excessive amounts of blood in the stool  Significant tenderness or worsening of abdominal pains  Swelling of the abdomen that is new, acute  Fever of 100F or higher For urgent or emergent issues, a gastroenterologist can be reached at any hour by calling [phone]. Do not use MyChart messaging for urgent concerns.    DIET:  We do recommend a small meal at first, but then you may proceed to your regular diet.  Drink plenty of fluids but you should avoid alcoholic beverages for  24 hours.  ACTIVITY:  You should plan to take it easy for the rest of today and you should NOT DRIVE or use heavy machinery until tomorrow (because of the sedation medicines used during the test).    FOLLOW UP: Our staff will call the number listed on your records 48-72 hours following your procedure to check on you and address any questions or concerns that you may have regarding the information given to you following your procedure. If we do not reach you, we will leave a message.  We will attempt to reach you two times.  During this call, we will ask if you have developed any symptoms of COVID 19. If you develop any symptoms (ie: fever, flu-like symptoms, shortness of breath, cough etc.) before then, please call [phone].  If you test positive for Covid 19 in the 2 weeks post procedure, please call and report this information to [place].     SIGNATURES/CONFIDENTIALITY: You and/or your care partner have signed paperwork which will be entered into your electronic medical record.  These signatures attest to the fact that that the information above on your After Visit Summary has been reviewed and is understood.  Full responsibility of the confidentiality of this discharge information lies with you and/or your care-partner.

## 2020-11-16 NOTE — Progress Notes (Signed)
Report to PACU, RN, vss, BBS= Clear.  

## 2020-11-18 ENCOUNTER — Telehealth: Payer: Self-pay

## 2020-11-18 NOTE — Telephone Encounter (Signed)
Left message on answering machine. 

## 2020-12-31 ENCOUNTER — Other Ambulatory Visit: Payer: Self-pay | Admitting: Endocrinology

## 2021-01-03 DIAGNOSIS — K219 Gastro-esophageal reflux disease without esophagitis: Secondary | ICD-10-CM | POA: Diagnosis not present

## 2021-01-03 DIAGNOSIS — E1165 Type 2 diabetes mellitus with hyperglycemia: Secondary | ICD-10-CM | POA: Diagnosis not present

## 2021-01-03 DIAGNOSIS — I129 Hypertensive chronic kidney disease with stage 1 through stage 4 chronic kidney disease, or unspecified chronic kidney disease: Secondary | ICD-10-CM | POA: Diagnosis not present

## 2021-01-03 DIAGNOSIS — Z794 Long term (current) use of insulin: Secondary | ICD-10-CM | POA: Diagnosis not present

## 2021-01-03 DIAGNOSIS — J309 Allergic rhinitis, unspecified: Secondary | ICD-10-CM | POA: Diagnosis not present

## 2021-01-03 DIAGNOSIS — G8929 Other chronic pain: Secondary | ICD-10-CM | POA: Diagnosis not present

## 2021-01-03 DIAGNOSIS — E1122 Type 2 diabetes mellitus with diabetic chronic kidney disease: Secondary | ICD-10-CM | POA: Diagnosis not present

## 2021-01-03 DIAGNOSIS — E785 Hyperlipidemia, unspecified: Secondary | ICD-10-CM | POA: Diagnosis not present

## 2021-01-25 ENCOUNTER — Other Ambulatory Visit: Payer: Self-pay

## 2021-01-25 ENCOUNTER — Ambulatory Visit (INDEPENDENT_AMBULATORY_CARE_PROVIDER_SITE_OTHER): Payer: Medicare PPO | Admitting: Endocrinology

## 2021-01-25 VITALS — BP 140/64 | HR 64 | Ht 63.75 in | Wt 206.6 lb

## 2021-01-25 DIAGNOSIS — E1129 Type 2 diabetes mellitus with other diabetic kidney complication: Secondary | ICD-10-CM

## 2021-01-25 DIAGNOSIS — R809 Proteinuria, unspecified: Secondary | ICD-10-CM

## 2021-01-25 DIAGNOSIS — E1169 Type 2 diabetes mellitus with other specified complication: Secondary | ICD-10-CM

## 2021-01-25 DIAGNOSIS — E785 Hyperlipidemia, unspecified: Secondary | ICD-10-CM | POA: Diagnosis not present

## 2021-01-25 LAB — POCT GLYCOSYLATED HEMOGLOBIN (HGB A1C): Hemoglobin A1C: 7.9 % — AB (ref 4.0–5.6)

## 2021-01-25 MED ORDER — COLESEVELAM HCL 625 MG PO TABS: 625.0000 mg | ORAL_TABLET | Freq: Every day | ORAL | 3 refills | Status: DC

## 2021-01-25 NOTE — Progress Notes (Signed)
Subjective:    Patient ID: Krystal Watson Born, female    DOB: 1945-10-16, 75 y.o.   MRN: [id number]  HPI Pt returns for f/u of diabetes mellitus:  DM type: Insulin-requiring type 2 Dx'ed: 7494  Complications: DN Therapy: insulin since 2021, and 3 oral meds.  GDM: never.  DKA: never.  Severe hypoglycemia: never.  Pancreatitis: never.  Pancreatic imaging: normal on 2010 CT.   SDOH: she declined Januvia, Rybelsus, and Onglyza, due to cost; she chose to d/c acarbose.   Other: she declines weight loss surgery: she did not tolerate pioglitazone (edema); she declines multiple daily injections. she did not tolerate Jardiance or bromocriptine (vaginitis).   Interval history: no cbg record, but states cbg's are in the high-100's.  She takes meds as rx'ed.  She takes welchol just 1/day.   Past Medical History:  Diagnosis Date   Allergy    sulfa   Arthritis    Cancer ([place])    breast   Chronic pain    Diabetes mellitus    Diverticulosis    Gallstones    GERD (gastroesophageal reflux disease)    Hyperlipidemia    Hypertension    Obesity    Shortness of breath    with exertion    Past Surgical History:  Procedure Laterality Date   ABDOMINAL HYSTERECTOMY     CHOLECYSTECTOMY     COLONOSCOPY     ERCP     Hx 2x   SIMPLE MASTECTOMY WITH AXILLARY SENTINEL NODE BIOPSY  04/24/2012   Procedure: SIMPLE MASTECTOMY WITH AXILLARY SENTINEL NODE BIOPSY;  Surgeon: Haywood Lasso, MD;  Location: [facility];  Service: General;  Laterality: Bilateral;  Bilateral total mastectomy and bilateral sentinel node    Social History   Socioeconomic History   Marital status: Married    Spouse name: Not on file   Number of children: 2   Years of education: Not on file   Highest education level: Not on file  Occupational History   Occupation: Teacher  Tobacco Use   Smoking status: Never   Smokeless tobacco: Never  Vaping Use   Vaping Use: Never used  Substance and Sexual Activity   Alcohol use:  No    Comment: rare   Drug use: No   Sexual activity: Yes  Other Topics Concern   Not on file  Social History Narrative   Not on file   Social Determinants of Health   Financial Resource Strain: Not on file  Food Insecurity: Not on file  Transportation Needs: Not on file  Physical Activity: Not on file  Stress: Not on file  Social Connections: Not on file  Intimate Partner Violence: Not on file    Current Outpatient Medications on File Prior to Visit  Medication Sig Dispense Refill   Alum Hydroxide-Mag Carbonate (GAVISCON PO) Take 2 tablets by mouth daily as needed (stomach discomfort). For stomach     aspirin 81 MG tablet Take 81 mg by mouth daily.     Calcium Carbonate-Vitamin D (CALTRATE 600+D) 600-400 MG-UNIT tablet Take 1 tablet by mouth daily.     diltiazem (DILACOR XR) 240 MG 24 hr capsule Take 240 mg by mouth daily.     esomeprazole (NEXIUM) 20 MG capsule Take 1 capsule (20 mg total) by mouth every other day.     glucose blood (ONETOUCH VERIO) test strip 1 each by Other route in the morning and at bedtime. And lancets 2/day 200 strip 3   hydrochlorothiazide (HYDRODIURIL) 25 MG tablet  Take 25 mg by mouth daily.     ibandronate (BONIVA) 150 MG tablet Take 150 mg by mouth every 30 (thirty) days. Take in the morning with a full glass of water, on an empty stomach, and do not take anything else by mouth or lie down for the next 30 min.     Lancets (ONETOUCH DELICA PLUS [id number]) MISC USE AS DIRECTED TO TEST BLOOD SUGAR DAILY 100 each 0   LANTUS SOLOSTAR 100 UNIT/ML Solostar Pen ADMINISTER 20 UNITS UNDER THE SKIN EVERY MORNING 15 mL 0   metFORMIN (GLUCOPHAGE) 500 MG tablet TAKE 1 TABLET(500 MG) BY MOUTH TWICE DAILY WITH A MEAL 180 tablet 3   Multiple Vitamins-Minerals (ONE-A-DAY WEIGHT SMART ADVANCE) TABS Take 1 tablet by mouth daily.     ondansetron (ZOFRAN) 4 MG tablet Take 1 tablet (4 mg total) by mouth every 6 (six) hours as needed for nausea. 20 tablet 0   phenylephrine  (SUDAFED PE) 10 MG TABS Take 10 mg by mouth every 4 (four) hours as needed. For allergies     PROAIR HFA 108 (90 BASE) MCG/ACT inhaler Inhale 2 puffs into the lungs every 6 (six) hours as needed for wheezing or shortness of breath.  0   ramipril (ALTACE) 10 MG capsule Take 10 mg by mouth daily.     repaglinide (PRANDIN) 2 MG tablet Take 1 tablet (2 mg total) by mouth 3 (three) times daily before meals. 270 tablet 3   No current facility-administered medications on file prior to visit.    Allergies  Allergen Reactions   Levofloxacin Other (See Comments)    Ankle pain   Sulfonamide Derivatives     Pt can not remember the reaction    Family History  Problem Relation Age of Onset   Colon cancer Father 57   Diabetes Mother    Heart disease Mother    Breast cancer Sister    Esophageal cancer Neg Hx    Stomach cancer Neg Hx    Rectal cancer Neg Hx     BP 140/64 (BP Location: Right Arm, Patient Position: Sitting, Cuff Size: Large)   Pulse 64   Ht 5' 3.75" (1.619 m)   Wt 206 lb 9.6 oz (93.7 kg)   SpO2 96%   BMI 35.74 kg/m    Review of Systems     Objective:   Physical Exam Pulses: dorsalis pedis intact bilat.   MSK: no deformity of the feet.   CV: trace bilat leg edema.   Skin:  no ulcer on the feet.  normal color and temp on the feet.   Neuro: sensation is intact to touch on the feet.     Lab Results  Component Value Date   HGBA1C 7.9 (A) 01/25/2021      Assessment & Plan:  Insulin-requiring type 2 DM: uncontrolled.    Patient Instructions  I have sent a prescription to your pharmacy, to increase the Lantus to 26 units each morning, and continue the same other 3 diabetes medications.  check your blood sugar once a day.  vary the time of day when you check, between before the 3 meals, and at bedtime.  also check if you have symptoms of your blood sugar being too high or too low.  please keep a record of the readings and bring it to your next appointment here (or you  can bring the meter itself).  You can write it on any piece of paper.  please call us sooner if your blood  sugar goes below 70, or if you have a lot of readings over 200.   Please come back for a follow-up appointment in 3 months.

## 2021-01-25 NOTE — Patient Instructions (Addendum)
I have sent a prescription to your pharmacy, to increase the Lantus to 26 units each morning, and continue the same other 3 diabetes medications.  check your blood sugar once a day.  vary the time of day when you check, between before the 3 meals, and at bedtime.  also check if you have symptoms of your blood sugar being too high or too low.  please keep a record of the readings and bring it to your next appointment here (or you can bring the meter itself).  You can write it on any piece of paper.  please call us sooner if your blood sugar goes below 70, or if you have a lot of readings over 200.   Please come back for a follow-up appointment in 3 months.

## 2021-02-22 DIAGNOSIS — N1831 Chronic kidney disease, stage 3a: Secondary | ICD-10-CM | POA: Diagnosis not present

## 2021-02-22 DIAGNOSIS — M810 Age-related osteoporosis without current pathological fracture: Secondary | ICD-10-CM | POA: Diagnosis not present

## 2021-02-22 DIAGNOSIS — I10 Essential (primary) hypertension: Secondary | ICD-10-CM | POA: Diagnosis not present

## 2021-02-22 DIAGNOSIS — E7800 Pure hypercholesterolemia, unspecified: Secondary | ICD-10-CM | POA: Diagnosis not present

## 2021-02-22 DIAGNOSIS — E1169 Type 2 diabetes mellitus with other specified complication: Secondary | ICD-10-CM | POA: Diagnosis not present

## 2021-02-22 DIAGNOSIS — Z7984 Long term (current) use of oral hypoglycemic drugs: Secondary | ICD-10-CM | POA: Diagnosis not present

## 2021-02-22 DIAGNOSIS — E0822 Diabetes mellitus due to underlying condition with diabetic chronic kidney disease: Secondary | ICD-10-CM | POA: Diagnosis not present

## 2021-03-02 ENCOUNTER — Telehealth: Payer: Self-pay | Admitting: Endocrinology

## 2021-03-02 ENCOUNTER — Other Ambulatory Visit: Payer: Self-pay

## 2021-03-02 DIAGNOSIS — E1129 Type 2 diabetes mellitus with other diabetic kidney complication: Secondary | ICD-10-CM

## 2021-03-02 MED ORDER — ACCU-CHEK GUIDE W/DEVICE KIT: PACK | 0 refills | Status: AC

## 2021-03-02 MED ORDER — ACCU-CHEK SOFTCLIX LANCETS MISC: 12 refills | Status: AC

## 2021-03-02 MED ORDER — ACCU-CHEK GUIDE VI STRP: ORAL_STRIP | 12 refills | Status: AC

## 2021-03-02 NOTE — Telephone Encounter (Signed)
PT called stating that her insurance will no longer provide coverage glucose blood (ONETOUCH VERIO) test strip. She need a new meter and test strip other than verio One Touch.  Please forward new prescription to:  [facility], [address]  [place], [place] [address]  Phone:  [phone]  Fax:  [phone]  DEA #:  [id number]

## 2021-03-07 ENCOUNTER — Other Ambulatory Visit (HOSPITAL_COMMUNITY): Payer: Self-pay

## 2021-04-01 ENCOUNTER — Other Ambulatory Visit: Payer: Self-pay | Admitting: Endocrinology

## 2021-04-09 ENCOUNTER — Other Ambulatory Visit: Payer: Self-pay | Admitting: Endocrinology

## 2021-05-03 ENCOUNTER — Ambulatory Visit (INDEPENDENT_AMBULATORY_CARE_PROVIDER_SITE_OTHER): Payer: Medicare PPO | Admitting: Endocrinology

## 2021-05-03 ENCOUNTER — Other Ambulatory Visit: Payer: Self-pay

## 2021-05-03 VITALS — BP 110/80 | HR 50 | Ht 63.75 in | Wt 209.6 lb

## 2021-05-03 DIAGNOSIS — E785 Hyperlipidemia, unspecified: Secondary | ICD-10-CM

## 2021-05-03 DIAGNOSIS — E1129 Type 2 diabetes mellitus with other diabetic kidney complication: Secondary | ICD-10-CM

## 2021-05-03 DIAGNOSIS — E1169 Type 2 diabetes mellitus with other specified complication: Secondary | ICD-10-CM | POA: Diagnosis not present

## 2021-05-03 DIAGNOSIS — R809 Proteinuria, unspecified: Secondary | ICD-10-CM | POA: Diagnosis not present

## 2021-05-03 LAB — POCT GLYCOSYLATED HEMOGLOBIN (HGB A1C): Hemoglobin A1C: 8.8 % — AB (ref 4.0–5.6)

## 2021-05-03 MED ORDER — RYBELSUS 3 MG PO TABS: 3.0000 mg | ORAL_TABLET | Freq: Every day | ORAL | 11 refills | Status: DC

## 2021-05-03 NOTE — Patient Instructions (Addendum)
I have sent a prescription to your pharmacy, to add Rybelsus, and continue the same other diabetes medications.  check your blood sugar once a day.  vary the time of day when you check, between before the 3 meals, and at bedtime.  also check if you have symptoms of your blood sugar being too high or too low.  please keep a record of the readings and bring it to your next appointment here (or you can bring the meter itself).  You can write it on any piece of paper.  please call us sooner if your blood sugar goes below 70, or if you have a lot of readings over 200.   Please come back for a follow-up appointment in 2-3 months.

## 2021-05-03 NOTE — Progress Notes (Signed)
Subjective:    Patient ID: Krystal Watson, female    DOB: 1945-05-27, 76 y.o.   MRN: [id number]  HPI Pt returns for f/u of diabetes mellitus:  DM type: Insulin-requiring type 2 Dx'ed: 0383  Complications: DN Therapy: insulin since 2021, and 3 oral meds.  GDM: never.  DKA: never.  Severe hypoglycemia: never.  Pancreatitis: never.  Pancreatic imaging: normal on 2010 CT.   SDOH: she declined Januvia, Rybelsus, and Onglyza, due to cost; she chose to d/c acarbose.   Other: she declines weight loss surgery: she did not tolerate pioglitazone (edema), Jardiance or bromocriptine (vaginitis).  she declines multiple daily injections.  Interval history: she brings a record of her cbg's which I have reviewed today.  cbg's vary from 148-204.  She takes welchol intermittently, and she sometimes forgets to take repaglinide.   Past Medical History:  Diagnosis Date   Allergy    sulfa   Arthritis    Cancer ([place])    breast   Chronic pain    Diabetes mellitus    Diverticulosis    Gallstones    GERD (gastroesophageal reflux disease)    Hyperlipidemia    Hypertension    Obesity    Shortness of breath    with exertion    Past Surgical History:  Procedure Laterality Date   ABDOMINAL HYSTERECTOMY     CHOLECYSTECTOMY     COLONOSCOPY     ERCP     Hx 2x   SIMPLE MASTECTOMY WITH AXILLARY SENTINEL NODE BIOPSY  04/24/2012   Procedure: SIMPLE MASTECTOMY WITH AXILLARY SENTINEL NODE BIOPSY;  Surgeon: Haywood Lasso, MD;  Location: [facility];  Service: General;  Laterality: Bilateral;  Bilateral total mastectomy and bilateral sentinel node    Social History   Socioeconomic History   Marital status: Married    Spouse name: Not on file   Number of children: 2   Years of education: Not on file   Highest education level: Not on file  Occupational History   Occupation: Teacher  Tobacco Use   Smoking status: Never   Smokeless tobacco: Never  Vaping Use   Vaping Use: Never used  Substance  and Sexual Activity   Alcohol use: No    Comment: rare   Drug use: No   Sexual activity: Yes  Other Topics Concern   Not on file  Social History Narrative   Not on file   Social Determinants of Health   Financial Resource Strain: Not on file  Food Insecurity: Not on file  Transportation Needs: Not on file  Physical Activity: Not on file  Stress: Not on file  Social Connections: Not on file  Intimate Partner Violence: Not on file    Current Outpatient Medications on File Prior to Visit  Medication Sig Dispense Refill   Accu-Chek Softclix Lancets lancets Use to check BS 1x a day 100 each 12   Alum Hydroxide-Mag Carbonate (GAVISCON PO) Take 2 tablets by mouth daily as needed (stomach discomfort). For stomach     aspirin 81 MG tablet Take 81 mg by mouth daily.     Blood Glucose Monitoring Suppl (ACCU-CHEK GUIDE) w/Device KIT Use As Directed 1 kit 0   Calcium Carbonate-Vitamin D (CALTRATE 600+D) 600-400 MG-UNIT tablet Take 1 tablet by mouth daily.     colesevelam (WELCHOL) 625 MG tablet Take 1 tablet (625 mg total) by mouth daily. 90 tablet 3   diltiazem (DILACOR XR) 240 MG 24 hr capsule Take 240 mg by mouth daily.  esomeprazole (NEXIUM) 20 MG capsule Take 1 capsule (20 mg total) by mouth every other day.     glucose blood (ACCU-CHEK GUIDE) test strip Use as instructed 100 each 12   hydrochlorothiazide (HYDRODIURIL) 25 MG tablet Take 25 mg by mouth daily.     ibandronate (BONIVA) 150 MG tablet Take 150 mg by mouth every 30 (thirty) days. Take in the morning with a full glass of water, on an empty stomach, and do not take anything else by mouth or lie down for the next 30 min.     Insulin Pen Needle (B-D UF III MINI PEN NEEDLES) 31G X 5 MM MISC USE AS DIRECTED ONCE DAILY 100 each 12   LANTUS SOLOSTAR 100 UNIT/ML Solostar Pen ADMINISTER 20 UNITS UNDER THE SKIN EVERY MORNING 15 mL 3   metFORMIN (GLUCOPHAGE) 500 MG tablet TAKE 1 TABLET(500 MG) BY MOUTH TWICE DAILY WITH A MEAL 180 tablet  3   Multiple Vitamins-Minerals (ONE-A-DAY WEIGHT SMART ADVANCE) TABS Take 1 tablet by mouth daily.     ondansetron (ZOFRAN) 4 MG tablet Take 1 tablet (4 mg total) by mouth every 6 (six) hours as needed for nausea. 20 tablet 0   phenylephrine (SUDAFED PE) 10 MG TABS Take 10 mg by mouth every 4 (four) hours as needed. For allergies     PROAIR HFA 108 (90 BASE) MCG/ACT inhaler Inhale 2 puffs into the lungs every 6 (six) hours as needed for wheezing or shortness of breath.  0   ramipril (ALTACE) 10 MG capsule Take 10 mg by mouth daily.     repaglinide (PRANDIN) 2 MG tablet Take 1 tablet (2 mg total) by mouth 3 (three) times daily before meals. 270 tablet 3   No current facility-administered medications on file prior to visit.    Allergies  Allergen Reactions   Levofloxacin Other (See Comments)    Ankle pain   Sulfonamide Derivatives     Pt can not remember the reaction    Family History  Problem Relation Age of Onset   Colon cancer Father 31   Diabetes Mother    Heart disease Mother    Breast cancer Sister    Esophageal cancer Neg Hx    Stomach cancer Neg Hx    Rectal cancer Neg Hx     BP 110/80    Pulse (!) 50    Ht 5' 3.75" (1.619 m)    Wt 209 lb 9.6 oz (95.1 kg)    SpO2 96%    BMI 36.26 kg/m    Review of Systems     Objective:   Physical Exam    Lab Results  Component Value Date   HGBA1C 8.8 (A) 05/03/2021      Assessment & Plan:  Insulin-requiring type 2 DM: uncontrolled.  We discussed.  She wants to re-try Rybelsus  Patient Instructions  I have sent a prescription to your pharmacy, to add Rybelsus, and continue the same other diabetes medications.  check your blood sugar once a day.  vary the time of day when you check, between before the 3 meals, and at bedtime.  also check if you have symptoms of your blood sugar being too high or too low.  please keep a record of the readings and bring it to your next appointment here (or you can bring the meter itself).  You  can write it on any piece of paper.  please call us sooner if your blood sugar goes below 70, or if you have a  lot of readings over 200.   Please come back for a follow-up appointment in 2-3 months.

## 2021-07-06 ENCOUNTER — Other Ambulatory Visit: Payer: Self-pay

## 2021-07-06 DIAGNOSIS — E1129 Type 2 diabetes mellitus with other diabetic kidney complication: Secondary | ICD-10-CM

## 2021-07-06 MED ORDER — METFORMIN HCL 500 MG PO TABS: ORAL_TABLET | ORAL | 3 refills | Status: DC

## 2021-07-06 MED ORDER — REPAGLINIDE 2 MG PO TABS: 2.0000 mg | ORAL_TABLET | Freq: Three times a day (TID) | ORAL | 3 refills | Status: DC

## 2021-07-12 ENCOUNTER — Encounter: Payer: Self-pay | Admitting: Internal Medicine

## 2021-07-20 ENCOUNTER — Encounter: Payer: Self-pay | Admitting: Endocrinology

## 2021-07-20 ENCOUNTER — Ambulatory Visit (INDEPENDENT_AMBULATORY_CARE_PROVIDER_SITE_OTHER): Payer: Medicare PPO | Admitting: Endocrinology

## 2021-07-20 VITALS — BP 126/84 | HR 67 | Ht 63.75 in | Wt 210.6 lb

## 2021-07-20 DIAGNOSIS — E1169 Type 2 diabetes mellitus with other specified complication: Secondary | ICD-10-CM

## 2021-07-20 DIAGNOSIS — E785 Hyperlipidemia, unspecified: Secondary | ICD-10-CM | POA: Diagnosis not present

## 2021-07-20 DIAGNOSIS — R809 Proteinuria, unspecified: Secondary | ICD-10-CM | POA: Diagnosis not present

## 2021-07-20 DIAGNOSIS — E1129 Type 2 diabetes mellitus with other diabetic kidney complication: Secondary | ICD-10-CM

## 2021-07-20 LAB — POCT GLYCOSYLATED HEMOGLOBIN (HGB A1C): Hemoglobin A1C: 7.8 % — AB (ref 4.0–5.6)

## 2021-07-20 MED ORDER — RYBELSUS 7 MG PO TABS: 7.0000 mg | ORAL_TABLET | Freq: Every day | ORAL | 1 refills | Status: DC

## 2021-07-20 MED ORDER — LANTUS SOLOSTAR 100 UNIT/ML ~~LOC~~ SOPN: 10.0000 [IU] | PEN_INJECTOR | SUBCUTANEOUS | 1 refills | Status: DC

## 2021-07-20 NOTE — Patient Instructions (Addendum)
I have sent prescriptions to your pharmacy, to increase the Rybelsus, and to reduce the Lantus.   ?Please continue the same other diabetes medications.  ?check your blood sugar once a day.  vary the time of day when you check, between before the 3 meals, and at bedtime.  also check if you have symptoms of your blood sugar being too high or too low.  please keep a record of the readings and bring it to your next appointment here (or you can bring the meter itself).  You can write it on any piece of paper.  please call us sooner if your blood sugar goes below 70, or if you have a lot of readings over 200.   ?You should have an endocrinology follow-up appointment in 3 months. ?

## 2021-07-20 NOTE — Progress Notes (Signed)
? ?Subjective:  ? ? Patient ID: Krystal Watson, female    DOB: December 04, 1945, 76 y.o.   MRN: [id number] ? ?HPI ?Pt returns for f/u of diabetes mellitus:  ?DM type: Insulin-requiring type 2 ?Dx'ed: 1998  ?Complications: DN ?Therapy: insulin since 2021, and 4 oral meds.  ?GDM: never.  ?DKA: never.  ?Severe hypoglycemia: never.  ?Pancreatitis: never.  ?Pancreatic imaging: normal on 2010 CT.   ?SDOH: she declined Januvia and Onglyza, due to cost; she chose to d/c acarbose.   ?Other: she declines weight loss surgery: she did not tolerate pioglitazone (edema), Jardiance or bromocriptine (vaginitis).  she declines multiple daily injections.  ?Interval history: she brings her meter with her cbg's which I have reviewed today.  cbg's vary from 112-170.  She takes meds as rx'ed.   ?Past Medical History:  ?Diagnosis Date  ? Allergy   ? sulfa  ? Arthritis   ? Cancer [facility])   ? breast  ? Chronic pain   ? Diabetes mellitus   ? Diverticulosis   ? Gallstones   ? GERD (gastroesophageal reflux disease)   ? Hyperlipidemia   ? Hypertension   ? Obesity   ? Shortness of breath   ? with exertion  ? ? ?Past Surgical History:  ?Procedure Laterality Date  ? ABDOMINAL HYSTERECTOMY    ? CHOLECYSTECTOMY    ? COLONOSCOPY    ? ERCP    ? Hx 2x  ? SIMPLE MASTECTOMY WITH AXILLARY SENTINEL NODE BIOPSY  04/24/2012  ? Procedure: SIMPLE MASTECTOMY WITH AXILLARY SENTINEL NODE BIOPSY;  Surgeon: Haywood Lasso, MD;  Location: [facility];  Service: General;  Laterality: Bilateral;  Bilateral total mastectomy and bilateral sentinel node  ? ? ?Social History  ? ?Socioeconomic History  ? Marital status: Married  ?  Spouse name: Not on file  ? Number of children: 2  ? Years of education: Not on file  ? Highest education level: Not on file  ?Occupational History  ? Occupation: Pharmacist, hospital  ?Tobacco Use  ? Smoking status: Never  ? Smokeless tobacco: Never  ?Vaping Use  ? Vaping Use: Never used  ?Substance and Sexual Activity  ? Alcohol use: No  ?  Comment: rare  ? Drug  use: No  ? Sexual activity: Yes  ?Other Topics Concern  ? Not on file  ?Social History Narrative  ? Not on file  ? ?Social Determinants of Health  ? ?Financial Resource Strain: Not on file  ?Food Insecurity: Not on file  ?Transportation Needs: Not on file  ?Physical Activity: Not on file  ?Stress: Not on file  ?Social Connections: Not on file  ?Intimate Partner Violence: Not on file  ? ? ?Current Outpatient Medications on File Prior to Visit  ?Medication Sig Dispense Refill  ? Accu-Chek Softclix Lancets lancets Use to check BS 1x a day 100 each 12  ? Alum Hydroxide-Mag Carbonate (GAVISCON PO) Take 2 tablets by mouth daily as needed (stomach discomfort). For stomach    ? aspirin 81 MG tablet Take 81 mg by mouth daily.    ? Blood Glucose Monitoring Suppl (ACCU-CHEK GUIDE) w/Device KIT Use As Directed 1 kit 0  ? Calcium Carbonate-Vitamin D (CALTRATE 600+D) 600-400 MG-UNIT tablet Take 1 tablet by mouth daily.    ? colesevelam (WELCHOL) 625 MG tablet Take 1 tablet (625 mg total) by mouth daily. 90 tablet 3  ? diltiazem (DILACOR XR) 240 MG 24 hr capsule Take 240 mg by mouth daily.    ? esomeprazole ([place]) 20  MG capsule Take 1 capsule (20 mg total) by mouth every other day.    ? glucose blood (ACCU-CHEK GUIDE) test strip Use as instructed 100 each 12  ? hydrochlorothiazide (HYDRODIURIL) 25 MG tablet Take 25 mg by mouth daily.    ? ibandronate (BONIVA) 150 MG tablet Take 150 mg by mouth every 30 (thirty) days. Take in the morning with a full glass of water, on an empty stomach, and do not take anything else by mouth or lie down for the next 30 min.    ? Insulin Pen Needle (B-D UF III MINI PEN NEEDLES) 31G X 5 MM MISC USE AS DIRECTED ONCE DAILY 100 each 12  ? metFORMIN (GLUCOPHAGE) 500 MG tablet TAKE 1 TABLET(500 MG) BY MOUTH TWICE DAILY WITH A MEAL 180 tablet 3  ? Multiple Vitamins-Minerals (ONE-A-DAY WEIGHT SMART ADVANCE) TABS Take 1 tablet by mouth daily.    ? ondansetron (ZOFRAN) 4 MG tablet Take 1 tablet (4 mg total)  by mouth every 6 (six) hours as needed for nausea. 20 tablet 0  ? phenylephrine (SUDAFED PE) 10 MG TABS Take 10 mg by mouth every 4 (four) hours as needed. For allergies    ? PROAIR HFA 108 (90 BASE) MCG/ACT inhaler Inhale 2 puffs into the lungs every 6 (six) hours as needed for wheezing or shortness of breath.  0  ? ramipril (ALTACE) 10 MG capsule Take 10 mg by mouth daily.    ? repaglinide (PRANDIN) 2 MG tablet Take 1 tablet (2 mg total) by mouth 3 (three) times daily before meals. 270 tablet 3  ? ?No current facility-administered medications on file prior to visit.  ? ? ?Allergies  ?Allergen Reactions  ? Levofloxacin Other (See Comments)  ?  Ankle pain  ? Sulfonamide Derivatives   ?  Pt can not remember the reaction  ? ? ?Family History  ?Problem Relation Age of Onset  ? Colon cancer Father 40  ? Diabetes Mother   ? Heart disease Mother   ? Breast cancer Sister   ? Esophageal cancer Neg Hx   ? Stomach cancer Neg Hx   ? Rectal cancer Neg Hx   ? ? ?BP 126/84 (BP Location: Left Arm, Patient Position: Sitting, Cuff Size: Normal)   Pulse 67   Ht 5' 3.75" (1.619 m)   Wt 210 lb 9.6 oz (95.5 kg)   SpO2 95%   BMI 36.43 kg/m?  ? ? ?Review of Systems ?Denies N/HB ?   ?Objective:  ? Physical Exam ?VITAL SIGNS:  See vs page.   ?GENERAL: no distress.   ? ? ? ?Lab Results  ?Component Value Date  ? HGBA1C 7.8 (A) 07/20/2021  ? ?   ?Assessment & Plan:  ?Insulin-requiring type 2 DM: uncontrolled ? ? ?Patient Instructions  ?I have sent prescriptions to your pharmacy, to increase the Rybelsus, and to reduce the Lantus.   ?Please continue the same other diabetes medications.  ?check your blood sugar once a day.  vary the time of day when you check, between before the 3 meals, and at bedtime.  also check if you have symptoms of your blood sugar being too high or too low.  please keep a record of the readings and bring it to your next appointment here (or you can bring the meter itself).  You can write it on any piece of paper.   please call us sooner if your blood sugar goes below 70, or if you have a lot of readings over 200.   ?  You should have an endocrinology follow-up appointment in 3 months. ? ? ?

## 2021-07-21 ENCOUNTER — Telehealth: Payer: Self-pay | Admitting: Endocrinology

## 2021-07-21 NOTE — Telephone Encounter (Signed)
Patient dropped off [employer] Blood Pressure Doctor Verification Form for Dr. Loanne Drilling to complete (Patient states she forgot to give it to Dr. Loanne Drilling 07/20/21 at visit). Patient requests to be called at ph# [phone] once the above mentioned form has been completed so that Patient can come to office and pick up the form. Form has been placed in Dr. Cordelia Pen mail box at front desk. ?

## 2021-07-25 NOTE — Telephone Encounter (Signed)
Form received, added 2 blood pressure readings and placed on providers desk for signature. ?

## 2021-07-25 NOTE — Telephone Encounter (Signed)
Informed patient that per Dr. Loanne Drilling the patient will need to give the form to her PCP. Patient will be coming back to the office to pick up the form. ?

## 2021-08-01 DIAGNOSIS — Z6837 Body mass index (BMI) 37.0-37.9, adult: Secondary | ICD-10-CM | POA: Diagnosis not present

## 2021-08-01 DIAGNOSIS — Z01419 Encounter for gynecological examination (general) (routine) without abnormal findings: Secondary | ICD-10-CM | POA: Diagnosis not present

## 2021-08-25 DIAGNOSIS — M810 Age-related osteoporosis without current pathological fracture: Secondary | ICD-10-CM | POA: Diagnosis not present

## 2021-08-25 DIAGNOSIS — N1831 Chronic kidney disease, stage 3a: Secondary | ICD-10-CM | POA: Diagnosis not present

## 2021-08-25 DIAGNOSIS — E7800 Pure hypercholesterolemia, unspecified: Secondary | ICD-10-CM | POA: Diagnosis not present

## 2021-08-25 DIAGNOSIS — Z1331 Encounter for screening for depression: Secondary | ICD-10-CM | POA: Diagnosis not present

## 2021-08-25 DIAGNOSIS — E1169 Type 2 diabetes mellitus with other specified complication: Secondary | ICD-10-CM | POA: Diagnosis not present

## 2021-08-25 DIAGNOSIS — I10 Essential (primary) hypertension: Secondary | ICD-10-CM | POA: Diagnosis not present

## 2021-08-25 DIAGNOSIS — I700 Atherosclerosis of aorta: Secondary | ICD-10-CM | POA: Diagnosis not present

## 2021-08-25 DIAGNOSIS — Z0000 Encounter for general adult medical examination without abnormal findings: Secondary | ICD-10-CM | POA: Diagnosis not present

## 2021-10-11 ENCOUNTER — Telehealth: Payer: Self-pay

## 2021-10-11 NOTE — Telephone Encounter (Signed)
Attempted to contact the patient in regards to rescheduling with another provider, LVM for a call back. 

## 2021-10-12 DIAGNOSIS — H25813 Combined forms of age-related cataract, bilateral: Secondary | ICD-10-CM | POA: Diagnosis not present

## 2021-10-12 DIAGNOSIS — H5203 Hypermetropia, bilateral: Secondary | ICD-10-CM | POA: Diagnosis not present

## 2021-10-12 DIAGNOSIS — E119 Type 2 diabetes mellitus without complications: Secondary | ICD-10-CM | POA: Diagnosis not present

## 2021-10-12 DIAGNOSIS — H35372 Puckering of macula, left eye: Secondary | ICD-10-CM | POA: Diagnosis not present

## 2021-11-04 DIAGNOSIS — Z6836 Body mass index (BMI) 36.0-36.9, adult: Secondary | ICD-10-CM | POA: Diagnosis not present

## 2021-11-04 DIAGNOSIS — I10 Essential (primary) hypertension: Secondary | ICD-10-CM | POA: Diagnosis not present

## 2021-11-04 DIAGNOSIS — Z7982 Long term (current) use of aspirin: Secondary | ICD-10-CM | POA: Diagnosis not present

## 2021-11-04 DIAGNOSIS — E119 Type 2 diabetes mellitus without complications: Secondary | ICD-10-CM | POA: Diagnosis not present

## 2021-11-04 DIAGNOSIS — K219 Gastro-esophageal reflux disease without esophagitis: Secondary | ICD-10-CM | POA: Diagnosis not present

## 2021-11-04 DIAGNOSIS — E785 Hyperlipidemia, unspecified: Secondary | ICD-10-CM | POA: Diagnosis not present

## 2021-11-04 DIAGNOSIS — M810 Age-related osteoporosis without current pathological fracture: Secondary | ICD-10-CM | POA: Diagnosis not present

## 2021-11-04 DIAGNOSIS — Z794 Long term (current) use of insulin: Secondary | ICD-10-CM | POA: Diagnosis not present

## 2021-11-22 DIAGNOSIS — I700 Atherosclerosis of aorta: Secondary | ICD-10-CM | POA: Diagnosis not present

## 2021-11-22 DIAGNOSIS — M810 Age-related osteoporosis without current pathological fracture: Secondary | ICD-10-CM | POA: Diagnosis not present

## 2021-11-22 DIAGNOSIS — I10 Essential (primary) hypertension: Secondary | ICD-10-CM | POA: Diagnosis not present

## 2021-11-22 DIAGNOSIS — E1165 Type 2 diabetes mellitus with hyperglycemia: Secondary | ICD-10-CM | POA: Diagnosis not present

## 2021-11-22 DIAGNOSIS — E782 Mixed hyperlipidemia: Secondary | ICD-10-CM | POA: Diagnosis not present

## 2021-12-29 DIAGNOSIS — R509 Fever, unspecified: Secondary | ICD-10-CM | POA: Diagnosis not present

## 2021-12-29 DIAGNOSIS — Z6835 Body mass index (BMI) 35.0-35.9, adult: Secondary | ICD-10-CM | POA: Diagnosis not present

## 2021-12-29 DIAGNOSIS — Z03818 Encounter for observation for suspected exposure to other biological agents ruled out: Secondary | ICD-10-CM | POA: Diagnosis not present

## 2022-02-17 ENCOUNTER — Other Ambulatory Visit: Payer: Self-pay

## 2022-02-17 DIAGNOSIS — I700 Atherosclerosis of aorta: Secondary | ICD-10-CM | POA: Diagnosis not present

## 2022-02-17 DIAGNOSIS — I10 Essential (primary) hypertension: Secondary | ICD-10-CM | POA: Diagnosis not present

## 2022-02-17 DIAGNOSIS — M810 Age-related osteoporosis without current pathological fracture: Secondary | ICD-10-CM | POA: Diagnosis not present

## 2022-02-17 DIAGNOSIS — E7800 Pure hypercholesterolemia, unspecified: Secondary | ICD-10-CM | POA: Diagnosis not present

## 2022-02-20 DIAGNOSIS — E1165 Type 2 diabetes mellitus with hyperglycemia: Secondary | ICD-10-CM | POA: Diagnosis not present

## 2022-02-20 DIAGNOSIS — I700 Atherosclerosis of aorta: Secondary | ICD-10-CM | POA: Diagnosis not present

## 2022-02-20 DIAGNOSIS — E782 Mixed hyperlipidemia: Secondary | ICD-10-CM | POA: Diagnosis not present

## 2022-02-20 DIAGNOSIS — I10 Essential (primary) hypertension: Secondary | ICD-10-CM | POA: Diagnosis not present

## 2022-02-22 ENCOUNTER — Other Ambulatory Visit: Payer: Self-pay | Admitting: Internal Medicine

## 2022-02-22 DIAGNOSIS — M810 Age-related osteoporosis without current pathological fracture: Secondary | ICD-10-CM

## 2022-03-03 DIAGNOSIS — J40 Bronchitis, not specified as acute or chronic: Secondary | ICD-10-CM | POA: Diagnosis not present

## 2022-03-03 DIAGNOSIS — J029 Acute pharyngitis, unspecified: Secondary | ICD-10-CM | POA: Diagnosis not present

## 2022-03-03 DIAGNOSIS — R1110 Vomiting, unspecified: Secondary | ICD-10-CM | POA: Diagnosis not present

## 2022-03-03 DIAGNOSIS — J020 Streptococcal pharyngitis: Secondary | ICD-10-CM | POA: Diagnosis not present

## 2022-03-03 DIAGNOSIS — J209 Acute bronchitis, unspecified: Secondary | ICD-10-CM | POA: Diagnosis not present

## 2022-03-14 ENCOUNTER — Ambulatory Visit
Admission: RE | Admit: 2022-03-14 | Discharge: 2022-03-14 | Disposition: A | Discharge status: Home / Self Care | Payer: Medicare PPO | Source: Ambulatory Visit | Attending: Physician Assistant | Admitting: Physician Assistant

## 2022-03-14 ENCOUNTER — Other Ambulatory Visit: Payer: Self-pay | Admitting: Physician Assistant

## 2022-03-14 DIAGNOSIS — R945 Abnormal results of liver function studies: Secondary | ICD-10-CM | POA: Diagnosis not present

## 2022-03-14 DIAGNOSIS — R051 Acute cough: Secondary | ICD-10-CM

## 2022-03-14 DIAGNOSIS — R000 Tachycardia, unspecified: Secondary | ICD-10-CM | POA: Diagnosis not present

## 2022-03-14 DIAGNOSIS — R1110 Vomiting, unspecified: Secondary | ICD-10-CM | POA: Diagnosis not present

## 2022-03-14 DIAGNOSIS — Z8619 Personal history of other infectious and parasitic diseases: Secondary | ICD-10-CM | POA: Diagnosis not present

## 2022-03-14 DIAGNOSIS — R059 Cough, unspecified: Secondary | ICD-10-CM | POA: Diagnosis not present

## 2022-03-15 DIAGNOSIS — R7989 Other specified abnormal findings of blood chemistry: Secondary | ICD-10-CM | POA: Diagnosis not present

## 2022-03-15 DIAGNOSIS — R509 Fever, unspecified: Secondary | ICD-10-CM | POA: Diagnosis not present

## 2022-03-15 DIAGNOSIS — R945 Abnormal results of liver function studies: Secondary | ICD-10-CM | POA: Diagnosis not present

## 2022-03-15 DIAGNOSIS — R1110 Vomiting, unspecified: Secondary | ICD-10-CM | POA: Diagnosis not present

## 2022-03-20 DIAGNOSIS — M79603 Pain in arm, unspecified: Secondary | ICD-10-CM | POA: Diagnosis not present

## 2022-03-20 DIAGNOSIS — R197 Diarrhea, unspecified: Secondary | ICD-10-CM | POA: Diagnosis not present

## 2022-03-20 DIAGNOSIS — Z8619 Personal history of other infectious and parasitic diseases: Secondary | ICD-10-CM | POA: Diagnosis not present

## 2022-03-20 DIAGNOSIS — R051 Acute cough: Secondary | ICD-10-CM | POA: Diagnosis not present

## 2022-03-20 DIAGNOSIS — R7989 Other specified abnormal findings of blood chemistry: Secondary | ICD-10-CM | POA: Diagnosis not present

## 2022-03-20 DIAGNOSIS — R000 Tachycardia, unspecified: Secondary | ICD-10-CM | POA: Diagnosis not present

## 2022-04-15 IMAGING — MR MR ABDOMEN WO/W CM MRCP
18 of 21 series · 41 of 48 positions shown · IV contrast (gadavist)
Comparison: CT scan earlier same day

CLINICAL DATA: Biliary dilatation on CT scan.  Renal cyst.

EXAM:
MRI ABDOMEN WITHOUT AND WITH CONTRAST (INCLUDING MRCP)
TECHNIQUE: Multiplanar multisequence MR imaging of the abdomen was performed
both before and after the administration of intravenous contrast.
Heavily T2-weighted images of the biliary and pancreatic ducts were
obtained, and three-dimensional MRCP images were rendered by post
processing.
CONTRAST:  10mL GADAVIST GADOBUTROL 1 MMOL/ML IV SOLN

[Series 3: T2 fat-sat · axial · 6.0mm · 1.41mm/px · z∈[-77,+175]mm · 2 of 36 slices shown]
[im 1/36]
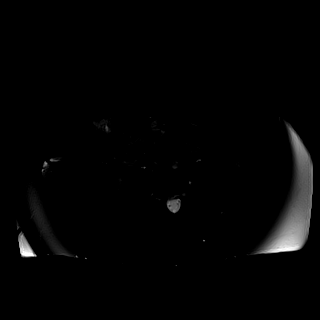
[im 36/36]
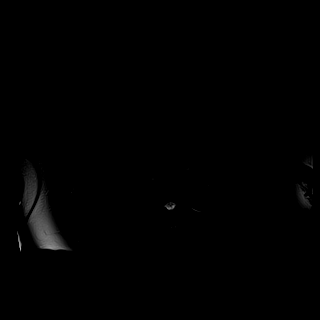

[Series 5: T2 · coronal · 6.0mm · 1.76mm/px · 1 of 30 slices shown (1 of 2)]
[im 1/30]
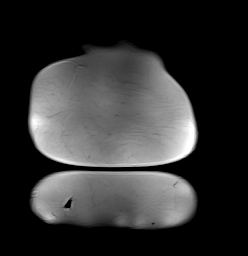

[Series 6: DWI · axial · 6.0mm · 1.68mm/px · z∈[-77,+175]mm · 2 of 72 slices shown (1 of 2)]
[im 1/72]
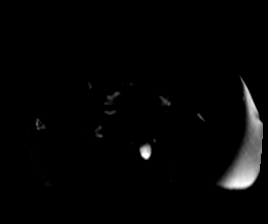
[im 72/72]
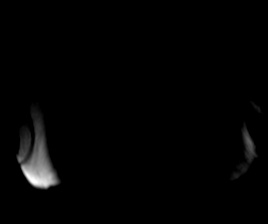

[Series 7: DWI · axial · 6.0mm · 1.68mm/px · 1 of 36 slices shown (2 of 2)]
[im 1/36]
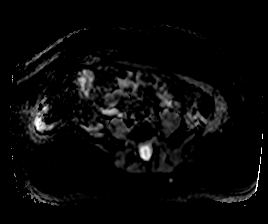

[Series 8: T1 · axial · 3.0mm · 1.41mm/px · z∈[-69,+168]mm · 3 of 80 slices shown (1 of 2)]
[im 1/80]
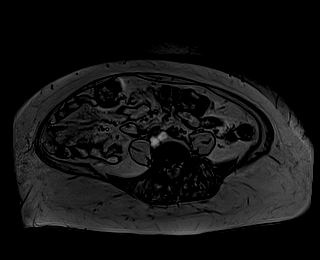
[im 40/80]
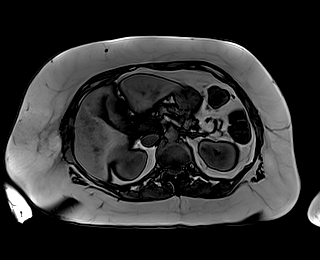
[im 80/80]
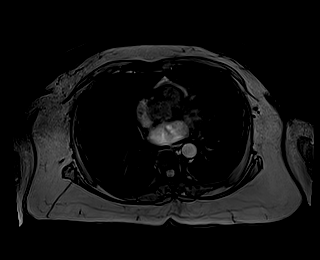

[Series 9: T1 · axial · 3.0mm · 1.41mm/px · z∈[-69,+168]mm · 3 of 80 slices shown (2 of 2)]
[im 1/80]
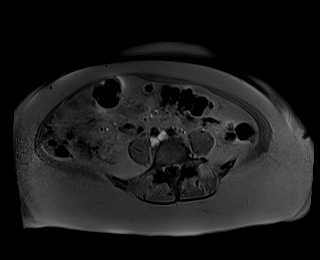
[im 40/80]
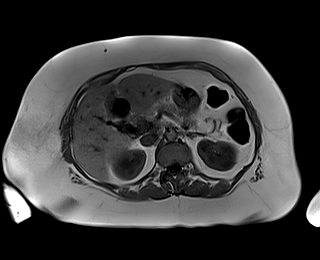
[im 80/80]
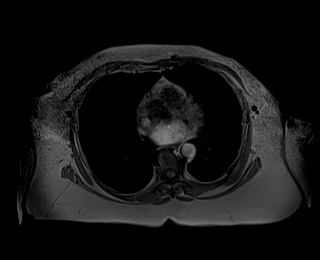

[Series 11: cor obl thk · sagittal · 50.0mm · 0.78mm/px · 1 of 9 slices shown]
[im 1/9]
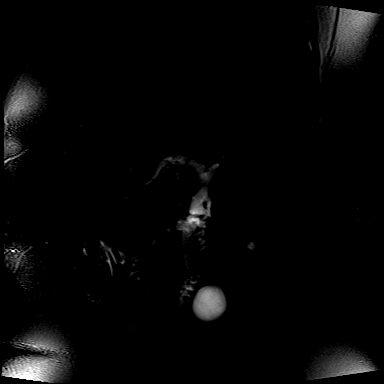

[Series 12: cor_3d_spc_trig · coronal · 1.0mm · 0.49mm/px · 2 of 72 slices shown]
[im 1/72]
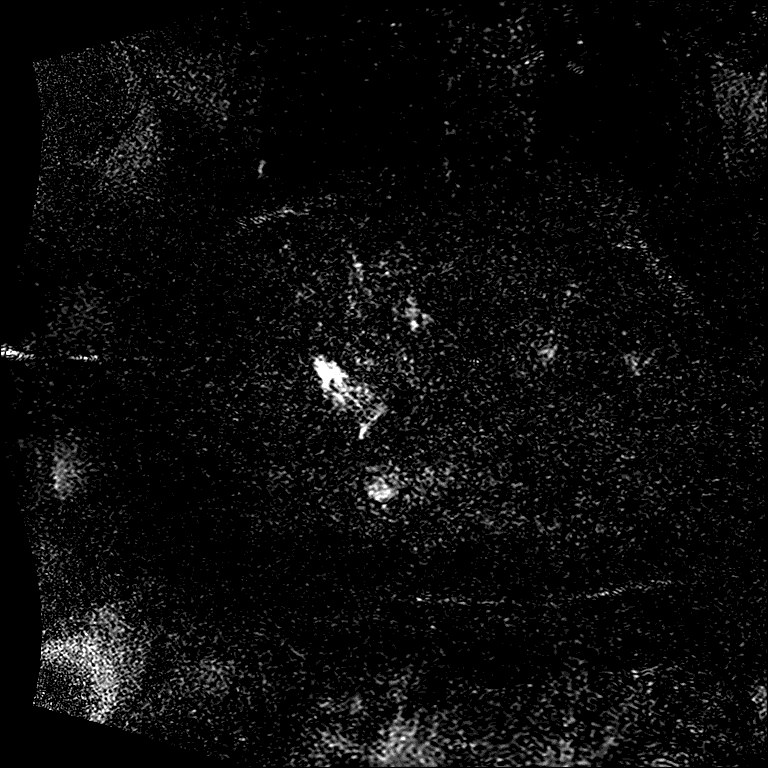
[im 72/72]
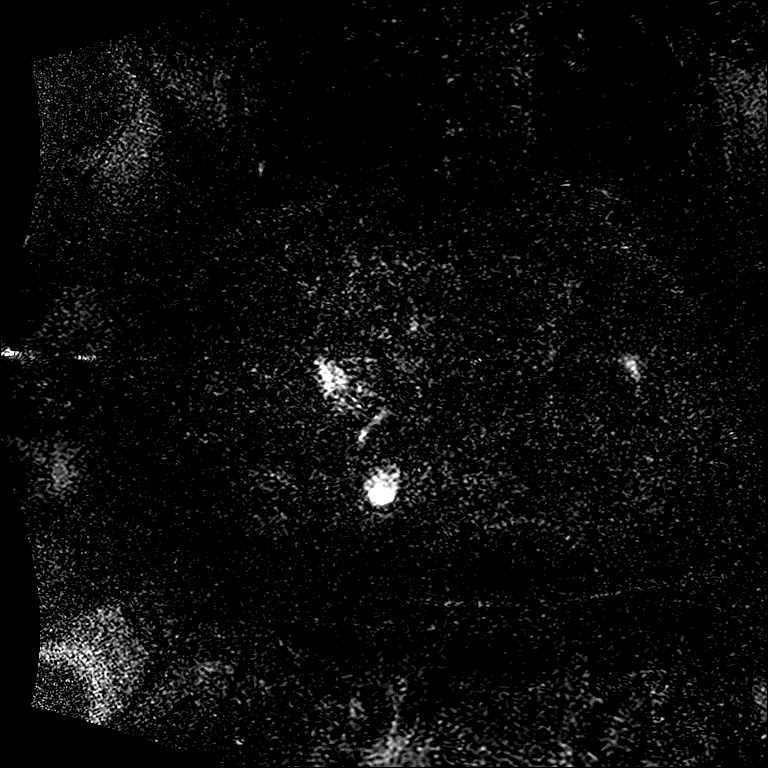

[Series 14: T2 · axial · 6.0mm · 1.76mm/px · 1 of 34 slices shown (2 of 2)]
[im 1/34]
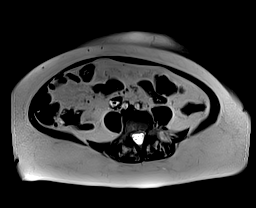

[Series 16: T1 dynamic · axial · 3.0mm · 1.41mm/px · z∈[-81,+180]mm · 3 of 88 slices shown (1 of 9)]
[im 1/88]
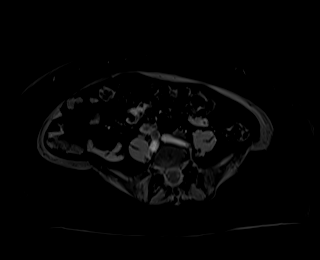
[im 44/88]
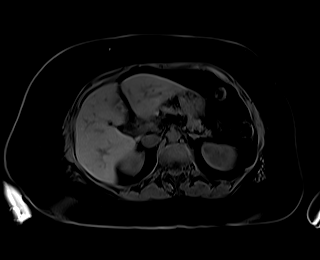
[im 88/88]
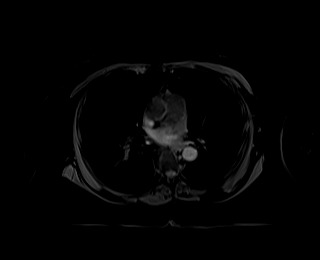

[Series 21: T1 dynamic · axial · 3.0mm · 1.41mm/px · z∈[-81,+180]mm · 3 of 88 slices shown (2 of 9)]
[im 1/88]
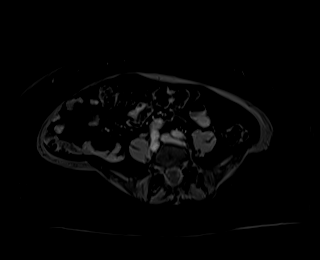
[im 44/88]
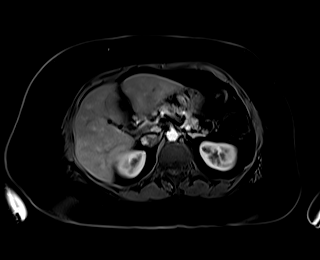
[im 88/88]
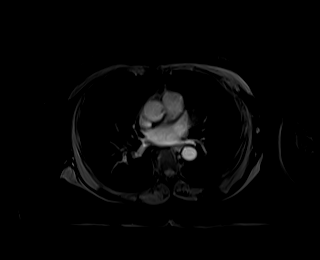

[Series 22: T1 dynamic · axial · 3.0mm · 1.41mm/px · z∈[-81,+180]mm · 3 of 88 slices shown (3 of 9)]
[im 1/88]
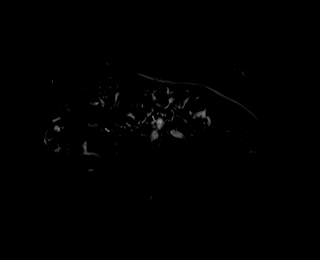
[im 44/88]
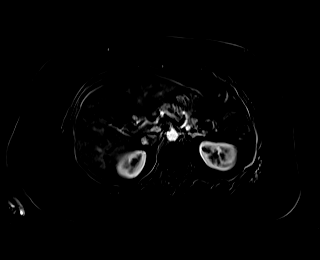
[im 88/88]
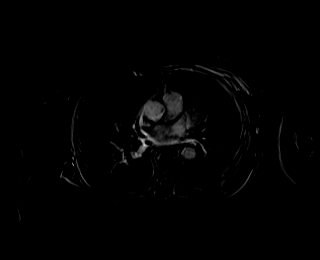

[Series 25: T1 dynamic · axial · 3.0mm · 1.41mm/px · z∈[-81,+180]mm · 3 of 88 slices shown (4 of 9)]
[im 1/88]
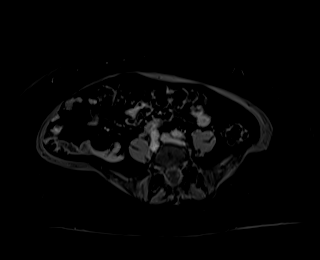
[im 44/88]
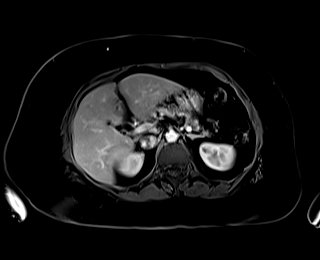
[im 88/88]
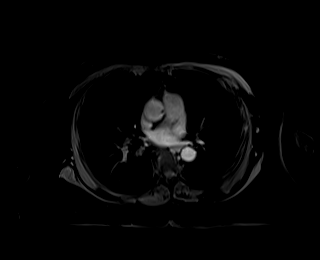

[Series 26: T1 dynamic · axial · 3.0mm · 1.41mm/px · z∈[-81,+180]mm · 3 of 88 slices shown (5 of 9)]
[im 1/88]
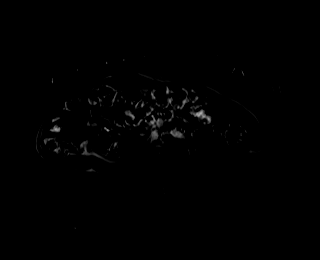
[im 44/88]
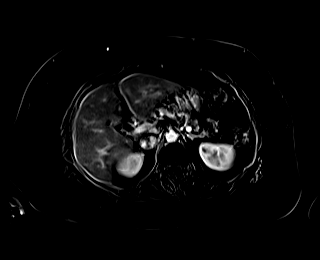
[im 88/88]
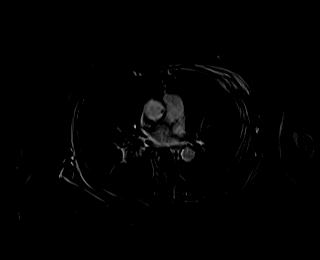

[Series 29: T1 dynamic · axial · 3.0mm · 1.41mm/px · z∈[-81,+180]mm · 3 of 88 slices shown (6 of 9)]
[im 1/88]
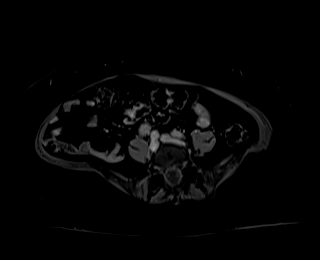
[im 44/88]
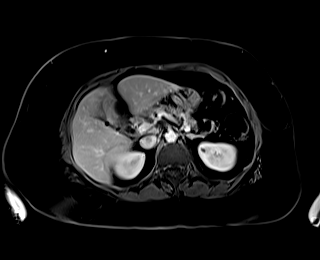
[im 88/88]
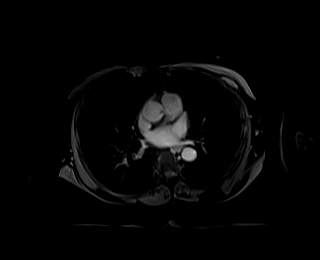

[Series 30: T1 dynamic · axial · 3.0mm · 1.41mm/px · z∈[-81,+180]mm · 3 of 88 slices shown (7 of 9)]
[im 1/88]
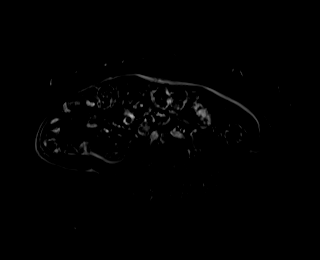
[im 44/88]
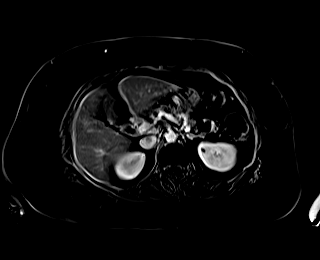
[im 88/88]
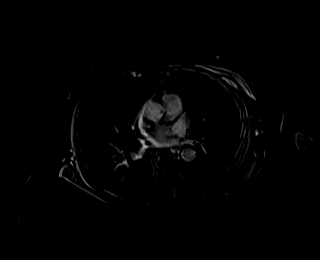

[Series 32: T1 dynamic · coronal · 3.0mm · 1.41mm/px · 2 of 72 slices shown (8 of 9)]
[im 1/72]
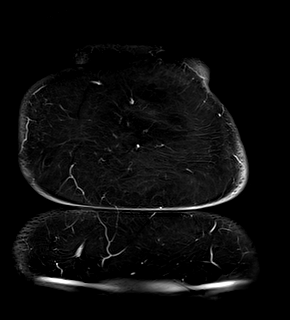
[im 72/72]
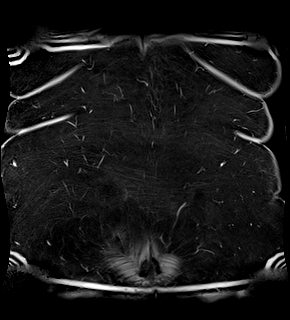

[Series 35: T1 dynamic · axial · 3.0mm · 1.41mm/px · z∈[-81,+48]mm · 2 of 88 slices shown (9 of 9)]
[im 1/88]
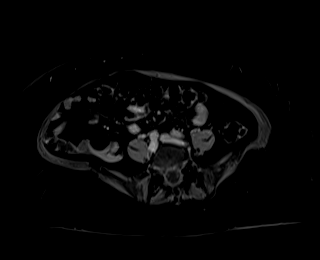
[im 44/88]
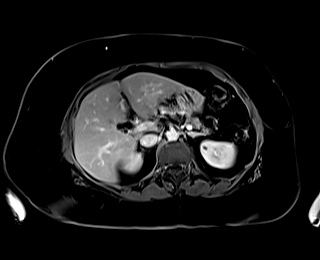

[41 of 48 positions shown; findings below may reference images not displayed]

FINDINGS: Lower chest: Unremarkable.

Hepatobiliary: No focal abnormality identified within the liver
parenchyma. Gallbladder surgically absent. There is mild
intrahepatic biliary duct dilatation. Common duct in the
hepatoduodenal ligament measures 10-11 mm today, not substantially
changed from 9 mm on a CT scan of 08/07/2008. The duct than
gradually tapers into the ampulla measuring 7 mm diameter in the
head of the pancreas compared to 6 mm on the CT from 7202. MRCP
imaging shows no choledocholithiasis. No discernible obstructing
lesion in the head of the pancreas.

Pancreas: No focal mass lesion. No dilatation of the main duct. No
intraparenchymal cyst. No peripancreatic edema.

Spleen:  No splenomegaly. No focal mass lesion.

Adrenals/Urinary Tract: No adrenal nodule or mass. 2.8 cm simple
cyst noted lower pole right kidney. Additional tiny T2
hyperintensities in both kidneys measure 6 mm less in size, too
small to characterize but no discernible enhancement in these are
also likely simple cyst.

Stomach/Bowel: Stomach is unremarkable. No gastric wall thickening.
No evidence of outlet obstruction. Duodenum is normally positioned
as is the ligament of Treitz. No small bowel or colonic dilatation
within the visualized abdomen.

Vascular/Lymphatic: No abdominal aortic aneurysm. No abdominal
lymphadenopathy.

Other:  No intraperitoneal free fluid.

Musculoskeletal: No focal suspicious marrow enhancement within the
visualized bony anatomy.
IMPRESSION: 1. Mild intra and extrahepatic biliary duct dilatation in this
patient status post cholecystectomy. Features not substantially
changed since abdomen/pelvis CT of 08/07/2008 and are likely related
to prior cholecystectomy. There is no evidence for
choledocholithiasis by MRCP today.
2. 2.8 cm simple cyst lower pole right kidney with additional tiny
T2 hyperintensities in both kidneys, too small to characterize but
likely also cysts.

## 2022-05-24 DIAGNOSIS — E782 Mixed hyperlipidemia: Secondary | ICD-10-CM | POA: Diagnosis not present

## 2022-05-24 DIAGNOSIS — R7989 Other specified abnormal findings of blood chemistry: Secondary | ICD-10-CM | POA: Diagnosis not present

## 2022-05-24 DIAGNOSIS — E1165 Type 2 diabetes mellitus with hyperglycemia: Secondary | ICD-10-CM | POA: Diagnosis not present

## 2022-05-24 DIAGNOSIS — E559 Vitamin D deficiency, unspecified: Secondary | ICD-10-CM | POA: Diagnosis not present

## 2022-05-24 DIAGNOSIS — I10 Essential (primary) hypertension: Secondary | ICD-10-CM | POA: Diagnosis not present

## 2022-06-28 DIAGNOSIS — R109 Unspecified abdominal pain: Secondary | ICD-10-CM | POA: Diagnosis not present

## 2022-06-28 DIAGNOSIS — E119 Type 2 diabetes mellitus without complications: Secondary | ICD-10-CM | POA: Diagnosis not present

## 2022-06-28 DIAGNOSIS — R112 Nausea with vomiting, unspecified: Secondary | ICD-10-CM | POA: Diagnosis not present

## 2022-06-29 ENCOUNTER — Other Ambulatory Visit: Payer: Self-pay | Admitting: Internal Medicine

## 2022-06-29 ENCOUNTER — Ambulatory Visit
Admission: RE | Admit: 2022-06-29 | Discharge: 2022-06-29 | Disposition: A | Discharge status: Home / Self Care | Payer: Medicare PPO | Source: Ambulatory Visit | Attending: Internal Medicine | Admitting: Internal Medicine

## 2022-06-29 DIAGNOSIS — R109 Unspecified abdominal pain: Secondary | ICD-10-CM

## 2022-07-06 DIAGNOSIS — K760 Fatty (change of) liver, not elsewhere classified: Secondary | ICD-10-CM | POA: Diagnosis not present

## 2022-07-06 DIAGNOSIS — R945 Abnormal results of liver function studies: Secondary | ICD-10-CM | POA: Diagnosis not present

## 2022-07-10 ENCOUNTER — Other Ambulatory Visit: Payer: Self-pay

## 2022-07-31 DIAGNOSIS — C50911 Malignant neoplasm of unspecified site of right female breast: Secondary | ICD-10-CM | POA: Diagnosis not present

## 2022-07-31 DIAGNOSIS — C50912 Malignant neoplasm of unspecified site of left female breast: Secondary | ICD-10-CM | POA: Diagnosis not present

## 2022-08-08 DIAGNOSIS — C50912 Malignant neoplasm of unspecified site of left female breast: Secondary | ICD-10-CM | POA: Diagnosis not present

## 2022-08-08 DIAGNOSIS — C50911 Malignant neoplasm of unspecified site of right female breast: Secondary | ICD-10-CM | POA: Diagnosis not present

## 2022-08-14 ENCOUNTER — Ambulatory Visit
Admission: RE | Admit: 2022-08-14 | Discharge: 2022-08-14 | Disposition: A | Discharge status: Home / Self Care | Payer: Medicare PPO | Source: Ambulatory Visit | Attending: Internal Medicine | Admitting: Internal Medicine

## 2022-08-14 DIAGNOSIS — M810 Age-related osteoporosis without current pathological fracture: Secondary | ICD-10-CM

## 2022-08-14 DIAGNOSIS — Z853 Personal history of malignant neoplasm of breast: Secondary | ICD-10-CM | POA: Diagnosis not present

## 2022-08-14 DIAGNOSIS — Z8781 Personal history of (healed) traumatic fracture: Secondary | ICD-10-CM | POA: Diagnosis not present

## 2022-08-14 DIAGNOSIS — E349 Endocrine disorder, unspecified: Secondary | ICD-10-CM | POA: Diagnosis not present

## 2022-08-23 DIAGNOSIS — E1165 Type 2 diabetes mellitus with hyperglycemia: Secondary | ICD-10-CM | POA: Diagnosis not present

## 2022-08-23 DIAGNOSIS — I10 Essential (primary) hypertension: Secondary | ICD-10-CM | POA: Diagnosis not present

## 2022-08-23 DIAGNOSIS — E782 Mixed hyperlipidemia: Secondary | ICD-10-CM | POA: Diagnosis not present

## 2022-08-23 DIAGNOSIS — I700 Atherosclerosis of aorta: Secondary | ICD-10-CM | POA: Diagnosis not present

## 2022-09-11 DIAGNOSIS — Z23 Encounter for immunization: Secondary | ICD-10-CM | POA: Diagnosis not present

## 2022-09-11 DIAGNOSIS — N1831 Chronic kidney disease, stage 3a: Secondary | ICD-10-CM | POA: Diagnosis not present

## 2022-09-11 DIAGNOSIS — R945 Abnormal results of liver function studies: Secondary | ICD-10-CM | POA: Diagnosis not present

## 2022-09-11 DIAGNOSIS — Z1389 Encounter for screening for other disorder: Secondary | ICD-10-CM | POA: Diagnosis not present

## 2022-09-11 DIAGNOSIS — E663 Overweight: Secondary | ICD-10-CM | POA: Diagnosis not present

## 2022-09-11 DIAGNOSIS — I700 Atherosclerosis of aorta: Secondary | ICD-10-CM | POA: Diagnosis not present

## 2022-09-11 DIAGNOSIS — Z0000 Encounter for general adult medical examination without abnormal findings: Secondary | ICD-10-CM | POA: Diagnosis not present

## 2022-09-11 DIAGNOSIS — E1122 Type 2 diabetes mellitus with diabetic chronic kidney disease: Secondary | ICD-10-CM | POA: Diagnosis not present

## 2022-09-11 DIAGNOSIS — I10 Essential (primary) hypertension: Secondary | ICD-10-CM | POA: Diagnosis not present

## 2022-10-02 ENCOUNTER — Ambulatory Visit (INDEPENDENT_AMBULATORY_CARE_PROVIDER_SITE_OTHER): Payer: Medicare PPO | Admitting: Internal Medicine

## 2022-10-02 ENCOUNTER — Encounter: Payer: Self-pay | Admitting: Internal Medicine

## 2022-10-02 ENCOUNTER — Other Ambulatory Visit (INDEPENDENT_AMBULATORY_CARE_PROVIDER_SITE_OTHER): Payer: Medicare PPO

## 2022-10-02 VITALS — BP 108/60 | HR 69 | Ht 62.5 in | Wt 193.0 lb

## 2022-10-02 DIAGNOSIS — W57XXXA Bitten or stung by nonvenomous insect and other nonvenomous arthropods, initial encounter: Secondary | ICD-10-CM

## 2022-10-02 DIAGNOSIS — R7989 Other specified abnormal findings of blood chemistry: Secondary | ICD-10-CM | POA: Diagnosis not present

## 2022-10-02 DIAGNOSIS — R109 Unspecified abdominal pain: Secondary | ICD-10-CM

## 2022-10-02 DIAGNOSIS — R1013 Epigastric pain: Secondary | ICD-10-CM

## 2022-10-02 DIAGNOSIS — S0006XA Insect bite (nonvenomous) of scalp, initial encounter: Secondary | ICD-10-CM | POA: Diagnosis not present

## 2022-10-02 DIAGNOSIS — K8051 Calculus of bile duct without cholangitis or cholecystitis with obstruction: Secondary | ICD-10-CM

## 2022-10-02 LAB — CBC WITH DIFFERENTIAL/PLATELET
Basophils Absolute: 0 10*3/uL (ref 0.0–0.1)
Basophils Relative: 0.6 % (ref 0.0–3.0)
Eosinophils Absolute: 0 10*3/uL (ref 0.0–0.7)
Eosinophils Relative: 0.5 % (ref 0.0–5.0)
HCT: 43.1 % (ref 36.0–46.0)
Hemoglobin: 14.1 g/dL (ref 12.0–15.0)
Lymphocytes Relative: 39.7 % (ref 12.0–46.0)
Lymphs Abs: 3.1 10*3/uL (ref 0.7–4.0)
MCHC: 32.8 g/dL (ref 30.0–36.0)
MCV: 95.5 fl (ref 78.0–100.0)
Monocytes Absolute: 0.6 10*3/uL (ref 0.1–1.0)
Monocytes Relative: 7.3 % (ref 3.0–12.0)
Neutro Abs: 4 10*3/uL (ref 1.4–7.7)
Neutrophils Relative %: 51.9 % (ref 43.0–77.0)
Platelets: 274 10*3/uL (ref 150.0–400.0)
RBC: 4.51 Mil/uL (ref 3.87–5.11)
RDW: 13.7 % (ref 11.5–15.5)
WBC: 7.7 10*3/uL (ref 4.0–10.5)

## 2022-10-02 LAB — HEPATIC FUNCTION PANEL
ALT: 17 U/L (ref 0–35)
AST: 18 U/L (ref 0–37)
Albumin: 4.2 g/dL (ref 3.5–5.2)
Alkaline Phosphatase: 94 U/L (ref 39–117)
Bilirubin, Direct: 0.1 mg/dL (ref 0.0–0.3)
Total Bilirubin: 0.7 mg/dL (ref 0.2–1.2)
Total Protein: 7.8 g/dL (ref 6.0–8.3)

## 2022-10-02 LAB — LIPASE: Lipase: 19 U/L (ref 11.0–59.0)

## 2022-10-02 NOTE — Patient Instructions (Signed)
Your provider has requested that you go to the basement level for lab work before leaving today. Press "B" on the elevator. The lab is located at the first door on the left as you exit the elevator.  You have been scheduled for an MRI at [facility]  on 10/11/22. Your appointment time is 7:00 am Please arrive to admitting (at main entrance of the hospital), (you may have to check-in through ED entrance) 15  minutes prior to your appointment time for registration purposes. Please make certain not to have anything to eat or drink 6 hours prior to your test. In addition, if you have any metal in your body, have a pacemaker or defibrillator, please be sure to let your ordering physician know. This test typically takes 45 minutes to 1 hour to complete. Should you need to reschedule, please call [phone] to do so.  Due to recent changes in healthcare laws, you may see the results of your imaging and laboratory studies on MyChart before your provider has had a chance to review them.  We understand that in some cases there may be results that are confusing or concerning to you. Not all laboratory results come back in the same time frame and the provider may be waiting for multiple results in order to interpret others.  Please give [place] 48 hours in order for your provider to thoroughly review all the results before contacting the office for clarification of your results.     If your blood pressure at your visit was 140/90 or greater, please contact your primary care physician to follow up on this.  _______________________________________________________  If you are age 82 or older, your body mass index should be between 23-30. Your Body mass index is 34.74 kg/m. If this is out of the aforementioned range listed, please consider follow up with your Primary Care Provider.  If you are age 78 or younger, your body mass index should be between 19-25. Your Body mass index is 34.74 kg/m. If this is out of the  aformentioned range listed, please consider follow up with your Primary Care Provider.   ________________________________________________________  The [facility] providers would like to encourage you to use [facility] to communicate with providers for non-urgent requests or questions.  Due to long hold times on the telephone, sending your provider a message by [facility] may be a faster and more efficient way to get a response.  Please allow 48 business hours for a response.  Please remember that this is for non-urgent requests.  _______________________________________________________  Thank you for choosing me and [facility].  Yancey Flemings MD

## 2022-10-02 NOTE — Progress Notes (Signed)
HISTORY OF PRESENT ILLNESS:  Krystal Watson is a 77 y.o. female, retired Chartered loss adjuster, with past medical history as listed below who is sent today by her primary care provider regarding elevated liver tests.  Outside office notes, records, laboratories, x-rays reviewed  The patient is status post cholecystectomy.  She has a history of symptomatic choledocholithiasis for which she underwent ERCP on 2 separate occasions, elsewhere, remotely.  She has been seen previously with acute abdominal pain associated with elevated liver test.  Subsequent follow-up, with the patient asymptomatic, showed resolution of liver test abnormalities.  She was last evaluated in this office for this particular problem 2 years ago.  She did undergo an MRCP September 16, 2020.  No choledocholithiasis at that time.  She was felt to pass the stone.  She tells me that in late March she developed epigastric pain with radiation into the back.  This occurred intermittently for 3 days.  Blood work at that time showed elevated liver tests with AST 148, ALT 282, alkaline phosphatase 258, and total bilirubin 0.6.  Follow-up liver tests July 06, 2022 were normal except for mild elevation of alkaline phosphatase at 115.  She was feeling well at that time.  She does tell me that she developed pain with nausea and vomiting on 1 occasion 2 weeks ago.  This resolved.  Feeling well.  She asks me about red bumps on her skin at the left ankle  REVIEW OF SYSTEMS:  All non-GI ROS negative unless otherwise stated in the HPI except for skin rash, itching, night sweats, arthritis  Past Medical History:  Diagnosis Date   Allergy    sulfa   Arthritis    Cancer (HCC)    breast   Chronic pain    Diabetes mellitus    Diverticulosis    Gallstones    GERD (gastroesophageal reflux disease)    Hepatic steatosis    Hyperlipidemia    Hypertension    Obesity    Osteoporosis    Shortness of breath    with exertion    Past Surgical History:   Procedure Laterality Date   ABDOMINAL HYSTERECTOMY     CHOLECYSTECTOMY     COLONOSCOPY     ERCP     Hx 2x   SIMPLE MASTECTOMY WITH AXILLARY SENTINEL NODE BIOPSY  04/24/2012   Procedure: SIMPLE MASTECTOMY WITH AXILLARY SENTINEL NODE BIOPSY;  Surgeon: Currie Paris, MD;  Location: [facility] OR;  Service: General;  Laterality: Bilateral;  Bilateral total mastectomy and bilateral sentinel node    Social History Krystal Watson  reports that she has never smoked. She has never used smokeless tobacco. She reports that she does not drink alcohol and does not use drugs.  family history includes Breast cancer in her sister; Colon cancer (age of onset: 76) in her father; Diabetes in her mother; Heart disease in her mother.  Allergies  Allergen Reactions   Levofloxacin Other (See Comments)    Ankle pain   Sulfonamide Derivatives     Pt can not remember the reaction       PHYSICAL EXAMINATION: Vital signs: BP 108/60   Pulse 69   Ht 5' 2.5" (1.588 m)   Wt 193 lb (87.5 kg)   BMI 34.74 kg/m   Constitutional: generally well-appearing, no acute distress Psychiatric: alert and oriented x3, cooperative Eyes: extraocular movements intact, anicteric, conjunctiva pink Mouth: oral pharynx moist, no lesions Neck: supple no lymphadenopathy Cardiovascular: heart regular rate and rhythm, no murmur Lungs: clear to  auscultation bilaterally Abdomen: soft, nontender, nondistended, no obvious ascites, no peritoneal signs, normal bowel sounds, no organomegaly Rectal: Omitted Extremities: no clubbing, cyanosis, or lower extremity edema bilaterally Skin: no lesions on visible extremities except for 3 insect bites at the left ankle level Neuro: No focal deficits.  Cranial nerves intact  ASSESSMENT:  1.  Recurrent abdominal pain with associated liver test abnormalities.  Resolved.  Again, I suspect that she passed a common duct stone.  Need to be sure that she does not have choledocholithiasis  currently. 2.  Status post cholecystectomy 3.  History of ERCP for choledocholithiasis, elsewhere, remotely. 4.  Normal colonoscopy 2022 save diverticulosis 5.  General medical problems.  Stable 6.  Insect bites  PLAN:  1.  Repeat liver test, CBC, and lipase today 2.  Schedule MRCP to rule out choledocholithiasis. 3.  If MRCP positive for choledocholithiasis, she will need ERCP for common duct clearance.  She understands. 4.  Ongoing general medical care with Dr. Nehemiah Settle 5.  Recommended hydrocortisone cream for insect bites.  If they do not resolve, see PCP or dermatology.  She agrees Total time 30 minutes was spent preparing to see the patient, obtaining comprehensive history, performing medically appropriate physical examination, counseling and educating the patient regarding above listed issues, ordering blood work and advanced radiology study, and documenting clinical information in the health record

## 2022-10-11 ENCOUNTER — Ambulatory Visit (HOSPITAL_COMMUNITY)
Admission: RE | Admit: 2022-10-11 | Discharge: 2022-10-11 | Disposition: A | Discharge status: Home / Self Care | Payer: Medicare PPO | Source: Ambulatory Visit | Attending: Internal Medicine | Admitting: Internal Medicine

## 2022-10-11 ENCOUNTER — Other Ambulatory Visit: Payer: Self-pay | Admitting: Internal Medicine

## 2022-10-11 DIAGNOSIS — R109 Unspecified abdominal pain: Secondary | ICD-10-CM | POA: Insufficient documentation

## 2022-10-11 DIAGNOSIS — K551 Chronic vascular disorders of intestine: Secondary | ICD-10-CM | POA: Diagnosis not present

## 2022-10-11 DIAGNOSIS — R7989 Other specified abnormal findings of blood chemistry: Secondary | ICD-10-CM | POA: Diagnosis not present

## 2022-10-11 DIAGNOSIS — D1803 Hemangioma of intra-abdominal structures: Secondary | ICD-10-CM | POA: Diagnosis not present

## 2022-10-11 DIAGNOSIS — R1013 Epigastric pain: Secondary | ICD-10-CM | POA: Insufficient documentation

## 2022-10-11 DIAGNOSIS — K838 Other specified diseases of biliary tract: Secondary | ICD-10-CM | POA: Diagnosis not present

## 2022-10-11 DIAGNOSIS — K449 Diaphragmatic hernia without obstruction or gangrene: Secondary | ICD-10-CM | POA: Diagnosis not present

## 2022-10-11 MED ORDER — GADOBUTROL 1 MMOL/ML IV SOLN
8.0000 mL | Freq: Once | INTRAVENOUS | Status: AC | PRN
  Administered 2022-10-11: 8 mL via INTRAVENOUS

## 2022-10-19 DIAGNOSIS — U071 COVID-19: Secondary | ICD-10-CM | POA: Diagnosis not present

## 2022-10-23 DIAGNOSIS — R945 Abnormal results of liver function studies: Secondary | ICD-10-CM | POA: Diagnosis not present

## 2022-11-28 DIAGNOSIS — U071 COVID-19: Secondary | ICD-10-CM | POA: Diagnosis not present

## 2022-11-28 DIAGNOSIS — I10 Essential (primary) hypertension: Secondary | ICD-10-CM | POA: Diagnosis not present

## 2022-11-28 DIAGNOSIS — E782 Mixed hyperlipidemia: Secondary | ICD-10-CM | POA: Diagnosis not present

## 2022-11-28 DIAGNOSIS — N1831 Chronic kidney disease, stage 3a: Secondary | ICD-10-CM | POA: Diagnosis not present

## 2022-11-28 DIAGNOSIS — E1165 Type 2 diabetes mellitus with hyperglycemia: Secondary | ICD-10-CM | POA: Diagnosis not present

## 2022-12-12 DIAGNOSIS — E1165 Type 2 diabetes mellitus with hyperglycemia: Secondary | ICD-10-CM | POA: Diagnosis not present

## 2023-01-29 DIAGNOSIS — H5203 Hypermetropia, bilateral: Secondary | ICD-10-CM | POA: Diagnosis not present

## 2023-01-29 DIAGNOSIS — H2513 Age-related nuclear cataract, bilateral: Secondary | ICD-10-CM | POA: Diagnosis not present

## 2023-01-29 DIAGNOSIS — E119 Type 2 diabetes mellitus without complications: Secondary | ICD-10-CM | POA: Diagnosis not present

## 2023-03-05 DIAGNOSIS — I10 Essential (primary) hypertension: Secondary | ICD-10-CM | POA: Diagnosis not present

## 2023-03-05 DIAGNOSIS — E1165 Type 2 diabetes mellitus with hyperglycemia: Secondary | ICD-10-CM | POA: Diagnosis not present

## 2023-03-05 DIAGNOSIS — E782 Mixed hyperlipidemia: Secondary | ICD-10-CM | POA: Diagnosis not present

## 2023-03-05 DIAGNOSIS — M810 Age-related osteoporosis without current pathological fracture: Secondary | ICD-10-CM | POA: Diagnosis not present

## 2023-03-12 DIAGNOSIS — E1165 Type 2 diabetes mellitus with hyperglycemia: Secondary | ICD-10-CM | POA: Diagnosis not present

## 2023-03-13 DIAGNOSIS — M810 Age-related osteoporosis without current pathological fracture: Secondary | ICD-10-CM | POA: Diagnosis not present

## 2023-03-13 DIAGNOSIS — I10 Essential (primary) hypertension: Secondary | ICD-10-CM | POA: Diagnosis not present

## 2023-03-13 DIAGNOSIS — I700 Atherosclerosis of aorta: Secondary | ICD-10-CM | POA: Diagnosis not present

## 2023-03-13 DIAGNOSIS — E7800 Pure hypercholesterolemia, unspecified: Secondary | ICD-10-CM | POA: Diagnosis not present

## 2023-03-13 DIAGNOSIS — E119 Type 2 diabetes mellitus without complications: Secondary | ICD-10-CM | POA: Diagnosis not present

## 2023-03-13 DIAGNOSIS — Z23 Encounter for immunization: Secondary | ICD-10-CM | POA: Diagnosis not present

## 2023-05-26 ENCOUNTER — Other Ambulatory Visit: Payer: Self-pay

## 2023-05-26 ENCOUNTER — Emergency Department (HOSPITAL_COMMUNITY): Payer: Medicare PPO

## 2023-05-26 ENCOUNTER — Encounter (HOSPITAL_COMMUNITY): Payer: Self-pay

## 2023-05-26 ENCOUNTER — Emergency Department (HOSPITAL_COMMUNITY)
Admission: EM | Admit: 2023-05-26 | Discharge: 2023-05-26 | Disposition: A | Discharge status: Home / Self Care | Payer: Medicare PPO | Attending: Emergency Medicine | Admitting: Emergency Medicine

## 2023-05-26 DIAGNOSIS — M79642 Pain in left hand: Secondary | ICD-10-CM | POA: Diagnosis not present

## 2023-05-26 DIAGNOSIS — M79645 Pain in left finger(s): Secondary | ICD-10-CM | POA: Insufficient documentation

## 2023-05-26 DIAGNOSIS — R0789 Other chest pain: Secondary | ICD-10-CM | POA: Diagnosis not present

## 2023-05-26 DIAGNOSIS — Y92410 Unspecified street and highway as the place of occurrence of the external cause: Secondary | ICD-10-CM | POA: Diagnosis not present

## 2023-05-26 DIAGNOSIS — S299XXA Unspecified injury of thorax, initial encounter: Secondary | ICD-10-CM | POA: Diagnosis present

## 2023-05-26 DIAGNOSIS — Z7982 Long term (current) use of aspirin: Secondary | ICD-10-CM | POA: Diagnosis not present

## 2023-05-26 DIAGNOSIS — S20212A Contusion of left front wall of thorax, initial encounter: Secondary | ICD-10-CM | POA: Insufficient documentation

## 2023-05-26 DIAGNOSIS — G4489 Other headache syndrome: Secondary | ICD-10-CM | POA: Diagnosis not present

## 2023-05-26 MED ORDER — LIDOCAINE 5 % EX PTCH: 1.0000 | MEDICATED_PATCH | CUTANEOUS | 0 refills | Status: DC

## 2023-05-26 MED ORDER — OXYCODONE-ACETAMINOPHEN 5-325 MG PO TABS: 1.0000 | ORAL_TABLET | Freq: Four times a day (QID) | ORAL | 0 refills | Status: DC | PRN

## 2023-05-26 MED ORDER — OXYCODONE-ACETAMINOPHEN 5-325 MG PO TABS
2.0000 | ORAL_TABLET | Freq: Once | ORAL | Status: AC
  Administered 2023-05-26: 2 via ORAL
  Filled 2023-05-26: qty 2
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
## 2023-06-01 DIAGNOSIS — R0789 Other chest pain: Secondary | ICD-10-CM | POA: Diagnosis not present

## 2023-06-01 DIAGNOSIS — M79646 Pain in unspecified finger(s): Secondary | ICD-10-CM | POA: Diagnosis not present

## 2023-06-01 DIAGNOSIS — M25559 Pain in unspecified hip: Secondary | ICD-10-CM | POA: Diagnosis not present

## 2023-06-01 DIAGNOSIS — M545 Low back pain, unspecified: Secondary | ICD-10-CM | POA: Diagnosis not present

## 2023-06-04 ENCOUNTER — Encounter (HOSPITAL_COMMUNITY): Payer: Self-pay

## 2023-06-04 ENCOUNTER — Emergency Department (HOSPITAL_COMMUNITY)

## 2023-06-04 ENCOUNTER — Emergency Department (HOSPITAL_COMMUNITY)
Admission: EM | Admit: 2023-06-04 | Discharge: 2023-06-04 | Disposition: A | Discharge status: Home / Self Care | Attending: Emergency Medicine | Admitting: Emergency Medicine

## 2023-06-04 DIAGNOSIS — R0789 Other chest pain: Secondary | ICD-10-CM | POA: Insufficient documentation

## 2023-06-04 DIAGNOSIS — Z79899 Other long term (current) drug therapy: Secondary | ICD-10-CM | POA: Insufficient documentation

## 2023-06-04 DIAGNOSIS — E119 Type 2 diabetes mellitus without complications: Secondary | ICD-10-CM | POA: Insufficient documentation

## 2023-06-04 DIAGNOSIS — I10 Essential (primary) hypertension: Secondary | ICD-10-CM | POA: Diagnosis not present

## 2023-06-04 DIAGNOSIS — I700 Atherosclerosis of aorta: Secondary | ICD-10-CM | POA: Diagnosis not present

## 2023-06-04 DIAGNOSIS — R079 Chest pain, unspecified: Secondary | ICD-10-CM | POA: Diagnosis not present

## 2023-06-04 DIAGNOSIS — R918 Other nonspecific abnormal finding of lung field: Secondary | ICD-10-CM | POA: Diagnosis not present

## 2023-06-04 DIAGNOSIS — Z7984 Long term (current) use of oral hypoglycemic drugs: Secondary | ICD-10-CM | POA: Diagnosis not present

## 2023-06-04 DIAGNOSIS — Z7982 Long term (current) use of aspirin: Secondary | ICD-10-CM | POA: Diagnosis not present

## 2023-06-04 DIAGNOSIS — Z794 Long term (current) use of insulin: Secondary | ICD-10-CM | POA: Insufficient documentation

## 2023-06-04 LAB — TROPONIN I (HIGH SENSITIVITY)
Troponin I (High Sensitivity): 2 ng/L (ref <18)
Troponin I (High Sensitivity): 2 ng/L (ref <18)

## 2023-06-04 LAB — BASIC METABOLIC PANEL
Anion gap: 13 (ref 5–15)
BUN: 14 mg/dL (ref 8–23)
CO2: 24 mmol/L (ref 22–32)
Calcium: 10 mg/dL (ref 8.9–10.3)
Chloride: 98 mmol/L (ref 98–111)
Creatinine, Ser: 0.64 mg/dL (ref 0.44–1.00)
GFR, Estimated: >60 mL/min (ref >60)
Glucose, Bld: 182 mg/dL — ABNORMAL HIGH (ref 70–99)
Potassium: 3.9 mmol/L (ref 3.5–5.1)
Sodium: 135 mmol/L (ref 135–145)

## 2023-06-04 LAB — CBC
HCT: 48.2 % — ABNORMAL HIGH (ref 36.0–46.0)
Hemoglobin: 15.9 g/dL — ABNORMAL HIGH (ref 12.0–15.0)
MCH: 31.5 pg (ref 26.0–34.0)
MCHC: 33 g/dL (ref 30.0–36.0)
MCV: 95.4 fL (ref 80.0–100.0)
Platelets: 246 10*3/uL (ref 150–400)
RBC: 5.05 MIL/uL (ref 3.87–5.11)
RDW: 12.6 % (ref 11.5–15.5)
WBC: 8.6 10*3/uL (ref 4.0–10.5)
nRBC: 0 % (ref 0.0–0.2)

## 2023-06-04 MED ORDER — OXYCODONE-ACETAMINOPHEN 5-325 MG PO TABS
1.0000 | ORAL_TABLET | Freq: Once | ORAL | Status: AC
  Administered 2023-06-04: 1 via ORAL
  Filled 2023-06-04: qty 1
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
## 2023-06-04 NOTE — Discharge Instructions (Signed)
 You were seen in the emergency department for your chest pain.  Your workup showed no signs of heart attack or stress on your heart.  With your recent rib injury it is likely that you are having some muscle pain or muscle spasms and you can continue to take your home oxycodone and meloxicam as needed for pain.  You do have some risk factors for heart disease so and I have given you a referral to follow-up with cardiology to ensure that the chest pain is not heart related.  You should return to the emergency department if your pain gets significantly worse despite the pain medicine, you break out in a sweat with the pain, you have severe shortness of breath, you pass out or if you have any other new or concerning symptoms.

## 2023-06-04 NOTE — ED Triage Notes (Signed)
 Pt presents with c/o chest pain that started this morning. Pt reports the pain is under her left breast radiating to the middle of her chest. Pt reports that she did call the paramedics when the pain began and they came and did an EKG. Pt reports the pain had begun to resolve but has now returned.
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
## 2023-06-07 ENCOUNTER — Encounter: Payer: Self-pay | Admitting: Cardiology

## 2023-06-07 ENCOUNTER — Ambulatory Visit: Attending: Cardiology | Admitting: Cardiology

## 2023-06-07 VITALS — BP 128/74 | HR 59 | Ht 62.6 in | Wt 200.0 lb

## 2023-06-07 DIAGNOSIS — R079 Chest pain, unspecified: Secondary | ICD-10-CM | POA: Diagnosis not present

## 2023-06-07 DIAGNOSIS — E785 Hyperlipidemia, unspecified: Secondary | ICD-10-CM | POA: Diagnosis not present

## 2023-06-07 DIAGNOSIS — E1169 Type 2 diabetes mellitus with other specified complication: Secondary | ICD-10-CM

## 2023-06-07 DIAGNOSIS — I10 Essential (primary) hypertension: Secondary | ICD-10-CM | POA: Diagnosis not present

## 2023-06-07 NOTE — Patient Instructions (Signed)
 Medication Instructions:  No changes *If you need a refill on your cardiac medications before your next appointment, please call your pharmacy*  Lab Work: No labs  Testing/Procedures: No testing  Follow-Up: At [facility], you and your health needs are our priority.  As part of our continuing mission to provide you with exceptional heart care, we have created designated Provider Care Teams.  These Care Teams include your primary Cardiologist (physician) and Advanced Practice Providers (APPs -  Physician Assistants and Nurse Practitioners) who all work together to provide you with the care you need, when you need it.  We recommend signing up for the patient portal called "MyChart".  Sign up information is provided on this After Visit Summary.  MyChart is used to connect with patients for Virtual Visits (Telemedicine).  Patients are able to view lab/test results, encounter notes, upcoming appointments, etc.  Non-urgent messages can be sent to your provider as well.   To learn more about what you can do with MyChart, go to [email or web address].    Your next appointment:   2-3 month(s)  Provider:   Any APP  Other Instructions:

## 2023-06-12 DIAGNOSIS — E1165 Type 2 diabetes mellitus with hyperglycemia: Secondary | ICD-10-CM | POA: Diagnosis not present

## 2023-06-20 DIAGNOSIS — E782 Mixed hyperlipidemia: Secondary | ICD-10-CM | POA: Diagnosis not present

## 2023-06-20 DIAGNOSIS — I10 Essential (primary) hypertension: Secondary | ICD-10-CM | POA: Diagnosis not present

## 2023-06-20 DIAGNOSIS — E1165 Type 2 diabetes mellitus with hyperglycemia: Secondary | ICD-10-CM | POA: Diagnosis not present

## 2023-06-20 DIAGNOSIS — H25813 Combined forms of age-related cataract, bilateral: Secondary | ICD-10-CM | POA: Diagnosis not present

## 2023-06-20 DIAGNOSIS — E559 Vitamin D deficiency, unspecified: Secondary | ICD-10-CM | POA: Diagnosis not present

## 2023-06-20 DIAGNOSIS — H43813 Vitreous degeneration, bilateral: Secondary | ICD-10-CM | POA: Diagnosis not present

## 2023-06-20 DIAGNOSIS — M810 Age-related osteoporosis without current pathological fracture: Secondary | ICD-10-CM | POA: Diagnosis not present

## 2023-07-04 DIAGNOSIS — H43813 Vitreous degeneration, bilateral: Secondary | ICD-10-CM | POA: Diagnosis not present

## 2023-07-04 DIAGNOSIS — H25813 Combined forms of age-related cataract, bilateral: Secondary | ICD-10-CM | POA: Diagnosis not present

## 2023-07-04 DIAGNOSIS — H02403 Unspecified ptosis of bilateral eyelids: Secondary | ICD-10-CM | POA: Diagnosis not present

## 2023-07-13 DIAGNOSIS — I2510 Atherosclerotic heart disease of native coronary artery without angina pectoris: Secondary | ICD-10-CM | POA: Diagnosis not present

## 2023-07-13 DIAGNOSIS — Z7982 Long term (current) use of aspirin: Secondary | ICD-10-CM | POA: Diagnosis not present

## 2023-07-13 DIAGNOSIS — Z809 Family history of malignant neoplasm, unspecified: Secondary | ICD-10-CM | POA: Diagnosis not present

## 2023-07-13 DIAGNOSIS — E785 Hyperlipidemia, unspecified: Secondary | ICD-10-CM | POA: Diagnosis not present

## 2023-07-13 DIAGNOSIS — Z833 Family history of diabetes mellitus: Secondary | ICD-10-CM | POA: Diagnosis not present

## 2023-07-13 DIAGNOSIS — Z8249 Family history of ischemic heart disease and other diseases of the circulatory system: Secondary | ICD-10-CM | POA: Diagnosis not present

## 2023-07-13 DIAGNOSIS — Z7984 Long term (current) use of oral hypoglycemic drugs: Secondary | ICD-10-CM | POA: Diagnosis not present

## 2023-07-13 DIAGNOSIS — E119 Type 2 diabetes mellitus without complications: Secondary | ICD-10-CM | POA: Diagnosis not present
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
## 2023-08-29 ENCOUNTER — Ambulatory Visit: Admitting: Cardiology

## 2023-08-29 DIAGNOSIS — E1169 Type 2 diabetes mellitus with other specified complication: Secondary | ICD-10-CM

## 2023-08-29 DIAGNOSIS — R079 Chest pain, unspecified: Secondary | ICD-10-CM

## 2023-08-29 DIAGNOSIS — I10 Essential (primary) hypertension: Secondary | ICD-10-CM

## 2023-09-10 DIAGNOSIS — E1165 Type 2 diabetes mellitus with hyperglycemia: Secondary | ICD-10-CM | POA: Diagnosis not present

## 2023-10-18 DIAGNOSIS — I10 Essential (primary) hypertension: Secondary | ICD-10-CM | POA: Diagnosis not present

## 2023-10-18 DIAGNOSIS — I700 Atherosclerosis of aorta: Secondary | ICD-10-CM | POA: Diagnosis not present

## 2023-10-18 DIAGNOSIS — R635 Abnormal weight gain: Secondary | ICD-10-CM | POA: Diagnosis not present

## 2023-10-18 DIAGNOSIS — M810 Age-related osteoporosis without current pathological fracture: Secondary | ICD-10-CM | POA: Diagnosis not present

## 2023-10-18 DIAGNOSIS — E559 Vitamin D deficiency, unspecified: Secondary | ICD-10-CM | POA: Diagnosis not present

## 2023-10-18 DIAGNOSIS — E1165 Type 2 diabetes mellitus with hyperglycemia: Secondary | ICD-10-CM | POA: Diagnosis not present

## 2023-10-23 DIAGNOSIS — M545 Low back pain, unspecified: Secondary | ICD-10-CM | POA: Diagnosis not present

## 2023-10-23 DIAGNOSIS — E872 Acidosis, unspecified: Secondary | ICD-10-CM | POA: Diagnosis not present

## 2023-10-23 DIAGNOSIS — J45909 Unspecified asthma, uncomplicated: Secondary | ICD-10-CM | POA: Diagnosis not present

## 2023-10-23 DIAGNOSIS — E1165 Type 2 diabetes mellitus with hyperglycemia: Secondary | ICD-10-CM | POA: Diagnosis not present

## 2023-10-23 DIAGNOSIS — R001 Bradycardia, unspecified: Secondary | ICD-10-CM | POA: Diagnosis not present

## 2023-10-23 DIAGNOSIS — K838 Other specified diseases of biliary tract: Secondary | ICD-10-CM | POA: Diagnosis not present

## 2023-10-23 DIAGNOSIS — R1010 Upper abdominal pain, unspecified: Secondary | ICD-10-CM | POA: Diagnosis not present

## 2023-10-23 DIAGNOSIS — B9620 Unspecified Escherichia coli [E. coli] as the cause of diseases classified elsewhere: Secondary | ICD-10-CM | POA: Diagnosis not present

## 2023-10-23 DIAGNOSIS — I7409 Other arterial embolism and thrombosis of abdominal aorta: Secondary | ICD-10-CM | POA: Diagnosis not present

## 2023-10-23 DIAGNOSIS — I10 Essential (primary) hypertension: Secondary | ICD-10-CM | POA: Diagnosis not present

## 2023-10-23 DIAGNOSIS — R509 Fever, unspecified: Secondary | ICD-10-CM | POA: Diagnosis not present

## 2023-10-23 DIAGNOSIS — R6510 Systemic inflammatory response syndrome (SIRS) of non-infectious origin without acute organ dysfunction: Secondary | ICD-10-CM | POA: Diagnosis not present

## 2023-10-23 DIAGNOSIS — R7881 Bacteremia: Secondary | ICD-10-CM | POA: Diagnosis not present

## 2023-10-23 DIAGNOSIS — K8309 Other cholangitis: Secondary | ICD-10-CM | POA: Diagnosis not present

## 2023-10-23 DIAGNOSIS — R7401 Elevation of levels of liver transaminase levels: Secondary | ICD-10-CM | POA: Diagnosis not present

## 2023-10-23 DIAGNOSIS — K8030 Calculus of bile duct with cholangitis, unspecified, without obstruction: Secondary | ICD-10-CM | POA: Diagnosis not present

## 2023-10-23 DIAGNOSIS — E785 Hyperlipidemia, unspecified: Secondary | ICD-10-CM | POA: Diagnosis not present

## 2023-10-23 DIAGNOSIS — E876 Hypokalemia: Secondary | ICD-10-CM | POA: Diagnosis not present

## 2023-10-23 DIAGNOSIS — B9689 Other specified bacterial agents as the cause of diseases classified elsewhere: Secondary | ICD-10-CM | POA: Diagnosis not present

## 2023-10-23 DIAGNOSIS — R000 Tachycardia, unspecified: Secondary | ICD-10-CM | POA: Diagnosis not present

## 2023-11-07 DIAGNOSIS — R7881 Bacteremia: Secondary | ICD-10-CM | POA: Diagnosis not present

## 2023-11-07 DIAGNOSIS — K8309 Other cholangitis: Secondary | ICD-10-CM | POA: Diagnosis not present

## 2023-11-07 DIAGNOSIS — R112 Nausea with vomiting, unspecified: Secondary | ICD-10-CM | POA: Diagnosis not present

## 2023-11-20 DIAGNOSIS — I700 Atherosclerosis of aorta: Secondary | ICD-10-CM | POA: Diagnosis not present

## 2023-11-20 DIAGNOSIS — E1169 Type 2 diabetes mellitus with other specified complication: Secondary | ICD-10-CM | POA: Diagnosis not present

## 2023-11-20 DIAGNOSIS — I10 Essential (primary) hypertension: Secondary | ICD-10-CM | POA: Diagnosis not present

## 2023-11-20 DIAGNOSIS — E7800 Pure hypercholesterolemia, unspecified: Secondary | ICD-10-CM | POA: Diagnosis not present

## 2023-11-20 DIAGNOSIS — M810 Age-related osteoporosis without current pathological fracture: Secondary | ICD-10-CM | POA: Diagnosis not present

## 2023-11-20 DIAGNOSIS — Z0000 Encounter for general adult medical examination without abnormal findings: Secondary | ICD-10-CM | POA: Diagnosis not present

## 2023-12-09 DIAGNOSIS — E1165 Type 2 diabetes mellitus with hyperglycemia: Secondary | ICD-10-CM | POA: Diagnosis not present

## 2024-01-21 DIAGNOSIS — Z01419 Encounter for gynecological examination (general) (routine) without abnormal findings: Secondary | ICD-10-CM | POA: Diagnosis not present

## 2024-01-21 DIAGNOSIS — Z6835 Body mass index (BMI) 35.0-35.9, adult: Secondary | ICD-10-CM | POA: Diagnosis not present

## 2024-01-24 DIAGNOSIS — M810 Age-related osteoporosis without current pathological fracture: Secondary | ICD-10-CM | POA: Diagnosis not present

## 2024-01-24 DIAGNOSIS — E1165 Type 2 diabetes mellitus with hyperglycemia: Secondary | ICD-10-CM | POA: Diagnosis not present

## 2024-01-24 DIAGNOSIS — E782 Mixed hyperlipidemia: Secondary | ICD-10-CM | POA: Diagnosis not present

## 2024-01-24 DIAGNOSIS — I10 Essential (primary) hypertension: Secondary | ICD-10-CM | POA: Diagnosis not present

## 2024-01-24 DIAGNOSIS — I700 Atherosclerosis of aorta: Secondary | ICD-10-CM | POA: Diagnosis not present

## 2024-01-31 DIAGNOSIS — E119 Type 2 diabetes mellitus without complications: Secondary | ICD-10-CM | POA: Diagnosis not present

## 2024-01-31 DIAGNOSIS — H25813 Combined forms of age-related cataract, bilateral: Secondary | ICD-10-CM | POA: Diagnosis not present

## 2024-03-05 ENCOUNTER — Encounter: Payer: Self-pay | Admitting: Internal Medicine

## 2024-03-05 ENCOUNTER — Ambulatory Visit: Admitting: Internal Medicine

## 2024-03-05 VITALS — BP 124/70 | HR 74 | Ht 62.6 in | Wt 198.0 lb

## 2024-03-05 DIAGNOSIS — K8050 Calculus of bile duct without cholangitis or cholecystitis without obstruction: Secondary | ICD-10-CM

## 2024-03-05 DIAGNOSIS — K8051 Calculus of bile duct without cholangitis or cholecystitis with obstruction: Secondary | ICD-10-CM

## 2024-03-05 DIAGNOSIS — K219 Gastro-esophageal reflux disease without esophagitis: Secondary | ICD-10-CM | POA: Diagnosis not present

## 2024-03-05 NOTE — Progress Notes (Signed)
 HISTORY OF PRESENT ILLNESS:  Krystal Watson is a 78 y.o. female, retired chartered loss adjuster, with past medical history as listed below.  She presents today for posthospitalization (elsewhere) follow-up.  I last saw the patient October 02, 2022 regarding recurrent abdominal pain associated liver test abnormalities concerning for recurrent choledocholithiasis.  She does have history of cholecystectomy and ERCP elsewhere for choledocholithiasis.  See that dictation.  The patient was in [place]   visiting her grandchildren in July.  She developed severe upper abdominal and back pain.  Felt poorly.  Went to the hospital.  Was found to have cholangitis with choledocholithiasis for which she underwent ERCP with sphincterotomy extension and common duct stone clearance.  She was treated with antibiotics and discharged.  Told to follow-up with this office.  Since her hospitalization she has been doing well.  No recurrent pain.  No other issues.  I did review the ERCP report (Dr. Cristina, October 24, 2023).  No indwelling stent.  REVIEW OF SYSTEMS:  All non-GI ROS negative except for back pain, arthritis, visual change, cough, night sweats  Past Medical History:  Diagnosis Date   Allergy    sulfa   Arthritis    Cancer (HCC)    breast   Chronic pain    Diabetes mellitus    Diverticulosis    Gallstones    GERD (gastroesophageal reflux disease)    Hepatic steatosis    Hyperlipidemia    Hypertension    Obesity    Osteoporosis    Shortness of breath    with exertion    Past Surgical History:  Procedure Laterality Date   ABDOMINAL HYSTERECTOMY     CHOLECYSTECTOMY     COLONOSCOPY     ERCP     Hx 2x   SIMPLE MASTECTOMY WITH AXILLARY SENTINEL NODE BIOPSY  04/24/2012   Procedure: SIMPLE MASTECTOMY WITH AXILLARY SENTINEL NODE BIOPSY;  Surgeon: Sherlean JINNY Laughter, MD;  Location: [facility] OR;  Service: General;  Laterality: Bilateral;  Bilateral total mastectomy and bilateral sentinel node     Social History Krystal Watson  reports that she has never smoked. She has never used smokeless tobacco. She reports that she does not drink alcohol and does not use drugs.  family history includes Breast cancer in her sister; Colon cancer (age of onset: 71) in her father; Diabetes in her mother; Heart disease in her mother.  Allergies  Allergen Reactions   Levofloxacin Other (See Comments)    Ankle pain   Sulfonamide Derivatives     Pt can not remember the reaction       PHYSICAL EXAMINATION: Vital signs: BP 124/70   Pulse 74   Ht 5' 2.6 (1.59 m)   Wt 198 lb (89.8 kg)   BMI 35.52 kg/m   Constitutional: generally well-appearing, no acute distress Psychiatric: alert and oriented x3, cooperative Eyes: extraocular movements intact, anicteric, conjunctiva pink Mouth: oral pharynx moist, no lesions Neck: supple no lymphadenopathy Cardiovascular: heart regular rate and rhythm, no murmur Lungs: clear to auscultation bilaterally Abdomen: soft, nontender, nondistended, no obvious ascites, no peritoneal signs, normal bowel sounds, no organomegaly Rectal: Omitted Extremities: no lower extremity edema bilaterally Skin: no lesions on visible extremities Neuro: No focal deficits.  Cranial nerves intact  ASSESSMENT:  1.  Choledocholithiasis status post ERCP with sphincterotomy and common duct stone clearance.  [facility] October 24, 2023.  Asymptomatic since 2.  GERD.  On Nexium  3.  Colonoscopy 2022 with diverticulosis.  No neoplasia   PLAN:  1.  Discussion on choledocholithiasis 2.  Reflux precautions 3.  Continue Nexium  4.  Aged out of colonoscopy surveillance 5.  General Medical problems.  Stable A total time of 30 minutes was spent preparing to see the patient, obtaining interval history, performing medically appropriate physical exam, counseling and educating the patient regarding the above listed issues, and documenting clinical information in the health  record

## 2024-03-05 NOTE — Patient Instructions (Signed)
 Please follow up as needed.  _______________________________________________________  If your blood pressure at your visit was 140/90 or greater, please contact your primary care physician to follow up on this.  _______________________________________________________  If you are age 76 or older, your body mass index should be between 23-30. Your Body mass index is 35.52 kg/m. If this is out of the aforementioned range listed, please consider follow up with your Primary Care Provider.  If you are age 54 or younger, your body mass index should be between 19-25. Your Body mass index is 35.52 kg/m. If this is out of the aformentioned range listed, please consider follow up with your Primary Care Provider.   ________________________________________________________  The [facility] providers would like to encourage you to use MYCHART to communicate with providers for non-urgent requests or questions.  Due to long hold times on the telephone, sending your provider a message by [facility] may be a faster and more efficient way to get a response.  Please allow 48 business hours for a response.  Please remember that this is for non-urgent requests.  _______________________________________________________  [facility] is using a team-based approach to care.  Your team is made up of your doctor and two to three APPS. Our APPS (Nurse Practitioners and Physician Assistants) work with your physician to ensure care continuity for you. They are fully qualified to address your health concerns and develop a treatment plan. They communicate directly with your gastroenterologist to care for you. Seeing the Advanced Practice Practitioners on your physician's team can help you by facilitating care more promptly, often allowing for earlier appointments, access to diagnostic testing, procedures, and other specialty referrals.

## 2024-03-08 DIAGNOSIS — E1165 Type 2 diabetes mellitus with hyperglycemia: Secondary | ICD-10-CM | POA: Diagnosis not present

## 2024-05-08 ENCOUNTER — Observation Stay (HOSPITAL_COMMUNITY)
Admission: EM | Admit: 2024-05-08 | Discharge: 2024-05-09 | Disposition: A | Source: Home / Self Care | Attending: Emergency Medicine | Admitting: Emergency Medicine

## 2024-05-08 ENCOUNTER — Emergency Department (HOSPITAL_COMMUNITY)

## 2024-05-08 ENCOUNTER — Encounter (HOSPITAL_COMMUNITY): Payer: Self-pay | Admitting: Internal Medicine

## 2024-05-08 ENCOUNTER — Other Ambulatory Visit: Payer: Self-pay

## 2024-05-08 DIAGNOSIS — R109 Unspecified abdominal pain: Secondary | ICD-10-CM | POA: Diagnosis present

## 2024-05-08 DIAGNOSIS — E872 Acidosis, unspecified: Secondary | ICD-10-CM

## 2024-05-08 DIAGNOSIS — K529 Noninfective gastroenteritis and colitis, unspecified: Secondary | ICD-10-CM

## 2024-05-08 DIAGNOSIS — R112 Nausea with vomiting, unspecified: Principal | ICD-10-CM

## 2024-05-08 DIAGNOSIS — E1169 Type 2 diabetes mellitus with other specified complication: Secondary | ICD-10-CM | POA: Diagnosis present

## 2024-05-08 DIAGNOSIS — R739 Hyperglycemia, unspecified: Secondary | ICD-10-CM

## 2024-05-08 LAB — URINALYSIS, ROUTINE W REFLEX MICROSCOPIC
Bilirubin Urine: NEGATIVE
Glucose, UA: >=500 mg/dL — AB
Hgb urine dipstick: NEGATIVE
Ketones, ur: 20 mg/dL — AB
Leukocytes,Ua: NEGATIVE
Nitrite: POSITIVE — AB
Protein, ur: 30 mg/dL — AB
Specific Gravity, Urine: 1.026 (ref 1.005–1.030)
pH: 5 (ref 5.0–8.0)

## 2024-05-08 LAB — CBC WITH DIFFERENTIAL/PLATELET
Abs Immature Granulocytes: 0.07 10*3/uL (ref 0.00–0.07)
Basophils Absolute: 0 10*3/uL (ref 0.0–0.1)
Basophils Relative: 0 %
Eosinophils Absolute: 0 10*3/uL (ref 0.0–0.5)
Eosinophils Relative: 0 %
HCT: 48.9 % — ABNORMAL HIGH (ref 36.0–46.0)
Hemoglobin: 16.1 g/dL — ABNORMAL HIGH (ref 12.0–15.0)
Immature Granulocytes: 0 %
Lymphocytes Relative: 8 %
Lymphs Abs: 1.3 10*3/uL (ref 0.7–4.0)
MCH: 31.7 pg (ref 26.0–34.0)
MCHC: 32.9 g/dL (ref 30.0–36.0)
MCV: 96.3 fL (ref 80.0–100.0)
Monocytes Absolute: 0.5 10*3/uL (ref 0.1–1.0)
Monocytes Relative: 3 %
Neutro Abs: 14.8 10*3/uL — ABNORMAL HIGH (ref 1.7–7.7)
Neutrophils Relative %: 89 %
Platelets: 266 10*3/uL (ref 150–400)
RBC: 5.08 MIL/uL (ref 3.87–5.11)
RDW: 12.6 % (ref 11.5–15.5)
WBC: 16.7 10*3/uL — ABNORMAL HIGH (ref 4.0–10.5)
nRBC: 0 % (ref 0.0–0.2)

## 2024-05-08 LAB — COMPREHENSIVE METABOLIC PANEL WITH GFR
ALT: 24 U/L (ref 0–44)
ALT: 21 U/L (ref 0–44)
AST: 35 U/L (ref 15–41)
AST: 28 U/L (ref 15–41)
Albumin: 3.9 g/dL (ref 3.5–5.0)
Albumin: 4 g/dL (ref 3.5–5.0)
Alkaline Phosphatase: 130 U/L — ABNORMAL HIGH (ref 38–126)
Alkaline Phosphatase: 119 U/L (ref 38–126)
Anion gap: 21 — ABNORMAL HIGH (ref 5–15)
Anion gap: 13 (ref 5–15)
BUN: 15 mg/dL (ref 8–23)
BUN: 12 mg/dL (ref 8–23)
CO2: 19 mmol/L — ABNORMAL LOW (ref 22–32)
CO2: 28 mmol/L (ref 22–32)
Calcium: 10.7 mg/dL — ABNORMAL HIGH (ref 8.9–10.3)
Calcium: 10.1 mg/dL (ref 8.9–10.3)
Chloride: 97 mmol/L — ABNORMAL LOW (ref 98–111)
Chloride: 98 mmol/L (ref 98–111)
Creatinine, Ser: 0.81 mg/dL (ref 0.44–1.00)
Creatinine, Ser: 0.78 mg/dL (ref 0.44–1.00)
GFR, Estimated: >60 mL/min
GFR, Estimated: >60 mL/min
Glucose, Bld: 380 mg/dL — ABNORMAL HIGH (ref 70–99)
Glucose, Bld: 279 mg/dL — ABNORMAL HIGH (ref 70–99)
Potassium: 3.9 mmol/L (ref 3.5–5.1)
Potassium: 3.5 mmol/L (ref 3.5–5.1)
Sodium: 137 mmol/L (ref 135–145)
Sodium: 139 mmol/L (ref 135–145)
Total Bilirubin: 1.1 mg/dL (ref 0.0–1.2)
Total Bilirubin: 0.8 mg/dL (ref 0.0–1.2)
Total Protein: 7.9 g/dL (ref 6.5–8.1)
Total Protein: 7.7 g/dL (ref 6.5–8.1)

## 2024-05-08 LAB — BLOOD GAS, VENOUS
Acid-Base Excess: 5.8 mmol/L — ABNORMAL HIGH (ref 0.0–2.0)
Bicarbonate: 31.2 mmol/L — ABNORMAL HIGH (ref 20.0–28.0)
O2 Saturation: 42.1 %
Patient temperature: 37
pCO2, Ven: 47 mmHg (ref 44–60)
pH, Ven: 7.43 (ref 7.25–7.43)
pO2, Ven: <31 mmHg — CL (ref 32–45)

## 2024-05-08 LAB — LIPASE, BLOOD: Lipase: 17 U/L (ref 11–51)

## 2024-05-08 LAB — I-STAT CG4 LACTIC ACID, ED: Lactic Acid, Venous: 2.5 mmol/L (ref 0.5–1.9)

## 2024-05-08 LAB — BETA-HYDROXYBUTYRIC ACID: Beta-Hydroxybutyric Acid: 0.84 mmol/L — ABNORMAL HIGH (ref 0.05–0.27)

## 2024-05-08 MED ORDER — ACETAMINOPHEN 650 MG RE SUPP: 650.0000 mg | Freq: Four times a day (QID) | RECTAL | Status: DC | PRN

## 2024-05-08 MED ORDER — MORPHINE SULFATE (PF) 4 MG/ML IV SOLN
4.0000 mg | Freq: Once | INTRAVENOUS | Status: AC
  Administered 2024-05-08: 4 mg via INTRAVENOUS
  Filled 2024-05-08: qty 1

## 2024-05-08 MED ORDER — ONDANSETRON HCL 4 MG/2ML IJ SOLN: 4.0000 mg | Freq: Four times a day (QID) | INTRAMUSCULAR | Status: DC | PRN

## 2024-05-08 MED ORDER — ACETAMINOPHEN 325 MG PO TABS: 650.0000 mg | ORAL_TABLET | Freq: Four times a day (QID) | ORAL | Status: DC | PRN

## 2024-05-08 MED ORDER — PNEUMOCOCCAL 20-VAL CONJ VACC 0.5 ML IM SUSY: 0.5000 mL | PREFILLED_SYRINGE | INTRAMUSCULAR | Status: DC | PRN

## 2024-05-08 MED ORDER — SODIUM CHLORIDE 0.9% FLUSH
3.0000 mL | Freq: Two times a day (BID) | INTRAVENOUS | Status: DC
  Administered 2024-05-09: 3 mL via INTRAVENOUS

## 2024-05-08 MED ORDER — IOHEXOL 300 MG/ML  SOLN
100.0000 mL | Freq: Once | INTRAMUSCULAR | Status: AC | PRN
  Administered 2024-05-08: 100 mL via INTRAVENOUS

## 2024-05-08 MED ORDER — LACTATED RINGERS IV BOLUS
1000.0000 mL | Freq: Once | INTRAVENOUS | Status: AC
  Administered 2024-05-08: 1000 mL via INTRAVENOUS

## 2024-05-08 MED ORDER — ONDANSETRON HCL 4 MG PO TABS: 4.0000 mg | ORAL_TABLET | Freq: Four times a day (QID) | ORAL | Status: DC | PRN

## 2024-05-08 MED ORDER — ONDANSETRON HCL 4 MG/2ML IJ SOLN: 4.0000 mg | Freq: Once | INTRAMUSCULAR | Status: DC

## 2024-05-08 MED ORDER — ONDANSETRON HCL 4 MG/2ML IJ SOLN
4.0000 mg | Freq: Once | INTRAMUSCULAR | Status: AC
  Administered 2024-05-08: 4 mg via INTRAVENOUS
  Filled 2024-05-08: qty 2

## 2024-05-08 MED ORDER — BISMUTH SUBSALICYLATE 262 MG PO CHEW: 524.0000 mg | CHEWABLE_TABLET | ORAL | Status: DC | PRN

## 2024-05-08 NOTE — H&P (Signed)
 " History and Physical    Patient: Krystal Watson FMW:[id number] DOB: 1946-03-29 DOA: 05/08/2024 DOS: the patient was seen and examined on 05/08/2024 PCP: Rexanne Ingle, MD  Patient coming from: Home  Chief Complaint:  Chief Complaint  Patient presents with   Nausea   Vomiting   HPI: Krystal Watson is a 79 y.o. female with medical history significant for breast cancer status post bilateral mastectomy, type 2 diabetes mellitus, history of cholecystectomy years ago but was hospitalized for cholangitis with bacteremia 6 months ago.  During that hospitalization she had an ERCP and a stone was removed.  The patient was told at that time if she ever has severe abdominal pain she should report back to the emergency department. Last night around midnight the patient developed some abdominal pain and had some diarrhea.  She also vomited.  And ended up vomiting every 2 hours for the entire night.  She did have some consistent discomfort in her right upper quadrant area.  She denies any fevers or chills.  This morning after having some any episodes of vomiting she decided to come in for evaluation.  She she is having right upper quadrant abdominal pain but it is not as severe as it was 6 months ago when she had cholangitis.  She was very sick during that hospital stay and it scared her. When she developed this right upper quadrant pain and the recurrent vomiting she presented for evaluation. In the ED the patient's blood work revealed metabolic acidosis.  Her anion gap was 21.  Her blood sugar 380.  Her transaminases were essentially fine with a T. bili of 1.1.  Her alk phos was 130 but the upper limit of normal is 129.  Her lactic acid was elevated at 2.5.  Her white blood cell count was 16.7.  The patient tells me that she had a bowel movement in the emergency department at about 1:00 PM and her abdominal pain has been completely gone since that time.  He has not had any further episodes of vomiting  either.  She has very little appetite because of her Rybelsus  that was just started 2 weeks ago.  She was able to eat some crackers without difficulty.   Review of Systems: As mentioned in the history of present illness. All other systems reviewed and are negative. Past Medical History:  Diagnosis Date   Allergy    sulfa   Arthritis    Cancer (HCC)    breast   Chronic pain    Diabetes mellitus    Diverticulosis    Gallstones    GERD (gastroesophageal reflux disease)    Hepatic steatosis    Hyperlipidemia    Hypertension    Obesity    Osteoporosis    Shortness of breath    with exertion   Past Surgical History:  Procedure Laterality Date   ABDOMINAL HYSTERECTOMY     CHOLECYSTECTOMY     COLONOSCOPY     ERCP     Hx 2x   SIMPLE MASTECTOMY WITH AXILLARY SENTINEL NODE BIOPSY  04/24/2012   Procedure: SIMPLE MASTECTOMY WITH AXILLARY SENTINEL NODE BIOPSY;  Surgeon: Sherlean JINNY Laughter, MD;  Location: [facility] OR;  Service: General;  Laterality: Bilateral;  Bilateral total mastectomy and bilateral sentinel node   Social History:  reports that she has never smoked. She has never used smokeless tobacco. She reports that she does not drink alcohol and does not use drugs.  Allergies[1]  Family History  Problem Relation Age  of Onset   Colon cancer Father 66   Diabetes Mother    Heart disease Mother    Breast cancer Sister    Esophageal cancer Neg Hx    Stomach cancer Neg Hx    Rectal cancer Neg Hx     Prior to Admission medications  Medication Sig Start Date End Date Taking? Authorizing Provider  Accu-Chek Softclix Lancets lancets Use to check BS 1x a day 03/02/21   Kassie Mallick, MD  Alum Hydroxide-Mag Carbonate (GAVISCON PO) Take 2 tablets by mouth daily as needed (stomach discomfort). For stomach    [provider]  aspirin  81 MG tablet Take 81 mg by mouth daily.    [provider]  atorvastatin  (LIPITOR) 40 MG tablet 1 TABLET ORALLY EVERY EVENING 03/24/19    [provider]  Blood Glucose Monitoring Suppl (ACCU-CHEK GUIDE) w/Device KIT Use As Directed 03/02/21   Kassie Mallick, MD  Calcium  Carbonate-Vitamin D  (CALTRATE 600+D) 600-400 MG-UNIT tablet Take 1 tablet by mouth daily. 09/17/20   Cheryle Page, MD  diltiazem  (DILACOR XR ) 240 MG 24 hr capsule Take 240 mg by mouth daily.    [provider]  esomeprazole  (NEXIUM ) 20 MG capsule Take 1 capsule (20 mg total) by mouth every other day. 09/17/20   Cheryle Page, MD  glucose blood (ACCU-CHEK GUIDE) test strip Use as instructed 03/02/21   Kassie Mallick, MD  hydrochlorothiazide  (HYDRODIURIL ) 25 MG tablet Take 25 mg by mouth daily.    [provider]  ibandronate (BONIVA) 150 MG tablet Take 150 mg by mouth every 30 (thirty) days. Take in the morning with a full glass of water, on an empty stomach, and do not take anything else by mouth or lie down for the next 30 min.    [provider]  insulin  glargine (LANTUS  SOLOSTAR) 100 UNIT/ML Solostar Pen Inject 10 Units into the skin every morning. 07/20/21   Kassie Mallick, MD  Insulin  Pen Needle (B-D UF III MINI PEN NEEDLES) 31G X 5 MM MISC USE AS DIRECTED ONCE DAILY 04/04/21   Kassie Mallick, MD  lidocaine  (LIDODERM ) 5 % Place 1 patch onto the skin daily. Remove & Discard patch within 12 hours or as directed by MD Patient not taking: Reported on 03/05/2024 05/26/23   Theotis Cameron HERO, PA-C  Multiple Vitamins-Minerals (ONE-A-DAY WEIGHT SMART ADVANCE) TABS Take 1 tablet by mouth daily.    [provider]  oxyCODONE -acetaminophen  (PERCOCET/ROXICET) 5-325 MG tablet Take 1 tablet by mouth every 6 (six) hours as needed for severe pain (pain score 7-10). Patient not taking: Reported on 03/05/2024 05/26/23   Theotis Cameron HERO, PA-C  PROAIR  HFA 108 (90 BASE) MCG/ACT inhaler Inhale 2 puffs into the lungs every 6 (six) hours as needed for wheezing or shortness of breath. 09/16/14   [provider]  ramipril  (ALTACE ) 10 MG capsule  Take 10 mg by mouth daily.    [provider]  Semaglutide  (RYBELSUS ) 7 MG TABS Take 7 mg by mouth daily. 07/20/21   Kassie Mallick, MD    Physical Exam: Vitals:   05/08/24 0844 05/08/24 1336  BP: 138/64 138/65  Pulse: 82 77  Resp: 16 16  Temp: 98.6 F (37 C) 97.6 F (36.4 C)  TempSrc: Oral Oral  SpO2: 98% 99%   Physical Exam:  General: No acute distress, well developed, well nourished HEENT: Normocephalic, atraumatic, PERRL Cardiovascular: Normal rate and rhythm. Distal pulses intact. Pulmonary: Normal pulmonary effort, normal breath sounds Gastrointestinal: Nondistended abdomen, soft, tenderness in RUQ,  hypoactive bowel sounds Musculoskeletal:Normal ROM, no lower ext edema Lymphadenopathy: No cervical LAD. Skin: Skin is warm and dry. Neuro: No focal deficits noted, AAOx3. PSYCH: Attentive and cooperative  Data Reviewed:  Results for orders placed or performed during the hospital encounter of 05/08/24 (from the past 24 hours)  CBC with Differential     Status: Abnormal   Collection Time: 05/08/24  9:39 AM  Result Value Ref Range   WBC 16.7 (H) 4.0 - 10.5 K/uL   RBC 5.08 3.87 - 5.11 MIL/uL   Hemoglobin 16.1 (H) 12.0 - 15.0 g/dL   HCT 51.0 (H) 63.9 - 53.9 %   MCV 96.3 80.0 - 100.0 fL   MCH 31.7 26.0 - 34.0 pg   MCHC 32.9 30.0 - 36.0 g/dL   RDW 87.3 88.4 - 84.4 %   Platelets 266 150 - 400 K/uL   nRBC 0.0 0.0 - 0.2 %   Neutrophils Relative % 89 %   Neutro Abs 14.8 (H) 1.7 - 7.7 K/uL   Lymphocytes Relative 8 %   Lymphs Abs 1.3 0.7 - 4.0 K/uL   Monocytes Relative 3 %   Monocytes Absolute 0.5 0.1 - 1.0 K/uL   Eosinophils Relative 0 %   Eosinophils Absolute 0.0 0.0 - 0.5 K/uL   Basophils Relative 0 %   Basophils Absolute 0.0 0.0 - 0.1 K/uL   Immature Granulocytes 0 %   Abs Immature Granulocytes 0.07 0.00 - 0.07 K/uL  Comprehensive metabolic panel with GFR     Status: Abnormal   Collection Time: 05/08/24 12:33 PM  Result Value Ref Range   Sodium 137 135 -  145 mmol/L   Potassium 3.9 3.5 - 5.1 mmol/L   Chloride 97 (L) 98 - 111 mmol/L   CO2 19 (L) 22 - 32 mmol/L   Glucose, Bld 380 (H) 70 - 99 mg/dL   BUN 15 8 - 23 mg/dL   Creatinine, Ser 9.18 0.44 - 1.00 mg/dL   Calcium  10.7 (H) 8.9 - 10.3 mg/dL   Total Protein 7.9 6.5 - 8.1 g/dL   Albumin 3.9 3.5 - 5.0 g/dL   AST 35 15 - 41 U/L   ALT 24 0 - 44 U/L   Alkaline Phosphatase 130 (H) 38 - 126 U/L   Total Bilirubin 1.1 0.0 - 1.2 mg/dL   GFR, Estimated >39 >39 mL/min   Anion gap 21 (H) 5 - 15  Lipase, blood     Status: None   Collection Time: 05/08/24 12:33 PM  Result Value Ref Range   Lipase 17 11 - 51 U/L  Beta-hydroxybutyric acid     Status: Abnormal   Collection Time: 05/08/24 12:33 PM  Result Value Ref Range   Beta-Hydroxybutyric Acid 0.84 (H) 0.05 - 0.27 mmol/L  Urinalysis, Routine w reflex microscopic -Urine, Clean Catch     Status: Abnormal   Collection Time: 05/08/24 12:34 PM  Result Value Ref Range   Color, Urine YELLOW YELLOW   APPearance HAZY (A) CLEAR   Specific Gravity, Urine 1.026 1.005 - 1.030   pH 5.0 5.0 - 8.0   Glucose, UA >=500 (A) NEGATIVE mg/dL   Hgb urine dipstick NEGATIVE NEGATIVE   Bilirubin Urine NEGATIVE NEGATIVE   Ketones, ur 20 (A) NEGATIVE mg/dL   Protein, ur 30 (A) NEGATIVE mg/dL   Nitrite POSITIVE (A) NEGATIVE   Leukocytes,Ua NEGATIVE NEGATIVE   RBC / HPF 0-5 0 - 5 RBC/hpf   WBC, UA 0-5 0 - 5 WBC/hpf   Bacteria, UA RARE (A)  NONE SEEN   Squamous Epithelial / HPF 0-5 0 - 5 /HPF   Mucus PRESENT    Hyaline Casts, UA PRESENT   Blood gas, venous     Status: Abnormal   Collection Time: 05/08/24  3:08 PM  Result Value Ref Range   pH, Ven 7.43 7.25 - 7.43   pCO2, Ven 47 44 - 60 mmHg   pO2, Ven <31 (LL) 32 - 45 mmHg   Bicarbonate 31.2 (H) 20.0 - 28.0 mmol/L   Acid-Base Excess 5.8 (H) 0.0 - 2.0 mmol/L   O2 Saturation 42.1 %   Patient temperature 37.0   I-Stat Lactic Acid     Status: Abnormal   Collection Time: 05/08/24  3:16 PM  Result Value Ref Range    Lactic Acid, Venous 2.5 (HH) 0.5 - 1.9 mmol/L   Comment NOTIFIED PHYSICIAN      Assessment and Plan: Abdominal pain/ nausea, vomiting and diarrhea in patient w h/o cholangitis - The patient's CT is within normal limits.  Her alk phos is minimally elevated at 130.  Her AST and ALT are within normal limits.  She is afebrile. Her white count is elevated at 16 and she does have some right upper quadrant tenderness. - Will admit for observation. - Symptomatic care, advance diet, monitor - Will hold her GLP while hospitalized.   2. DMT2 with hyperglycemia - the patient was started on Rybelsus / insulin  combination pen daily just 2 weeks ago. She did not take it today.   - Will resume Lantus  without Rybelsus  for today at 20 units instead of 30units.  Her appetite is much less on the GLP.  - Add corrective dose insulin  as needed - I did advise the patient that one of the side effects of GLP's is abdominal trouble, including constipation and nausea and she should monitor this  3.  Metabolic acidosis - will repeat the BMP now.  She received a total of 1 L IV fluids in the ED. - Continue IV fluids I do not think she this is DKA.    Advance Care Planning:   Code Status: Prior  The patient names her husband is her surrogate decision maker and she wants to be full code.  Consults: None  Family Communication: None  Severity of Illness: The appropriate patient status for this patient is INPATIENT. Inpatient status is judged to be reasonable and necessary in order to provide the required intensity of service to ensure the patient's safety. The patient's presenting symptoms, physical exam findings, and initial radiographic and laboratory data in the context of their chronic comorbidities is felt to place them at high risk for further clinical deterioration. Furthermore, it is not anticipated that the patient will be medically stable for discharge from the hospital within 2 midnights of admission.    * I certify that at the point of admission it is my clinical judgment that the patient will require inpatient hospital care spanning beyond 2 midnights from the point of admission due to high intensity of service, high risk for further deterioration and high frequency of surveillance required.*  Author: ARTHEA CHILD, MD 05/08/2024 4:33 PM  For on call review [email or web address].      [1]  Allergies Allergen Reactions   Levofloxacin Other (See Comments)    Ankle pain   Sulfonamide Derivatives     Pt can not remember the reaction   "

## 2024-05-08 NOTE — ED Triage Notes (Signed)
 Pt from home , n/v/d x 11pm last night , unable to keep food down, pt reports hx of cholangtis

## 2024-05-08 NOTE — ED Provider Notes (Signed)
 " [place] EMERGENCY DEPARTMENT AT [facility] Provider Note   CSN: [id number] Arrival date & time: 05/08/24  9163     Patient presents with: Nausea and Vomiting   Krystal Watson is a 79 y.o. female.   HPI 79 year old female presents with diarrhea, vomiting, abdominal pain.  Symptoms started at 11 PM last night.  She denies fever, chest pain, shortness of breath.  No blood in the stool or emesis.  Her pain is moderate, 5 out of 10.  She is concerned because of the symptoms in general but she also had a history of cholangitis requiring ERCP last summer.  She also has a history of hypertension, diabetes, and a previous cholecystectomy.  Prior to Admission medications  Medication Sig Start Date End Date Taking? Authorizing Provider  Accu-Chek Softclix Lancets lancets Use to check BS 1x a day 03/02/21   Kassie Mallick, MD  Alum Hydroxide-Mag Carbonate (GAVISCON PO) Take 2 tablets by mouth daily as needed (stomach discomfort). For stomach    [provider]  aspirin  81 MG tablet Take 81 mg by mouth daily.    [provider]  atorvastatin  (LIPITOR) 40 MG tablet 1 TABLET ORALLY EVERY EVENING 03/24/19   [provider]  Blood Glucose Monitoring Suppl (ACCU-CHEK GUIDE) w/Device KIT Use As Directed 03/02/21   Kassie Mallick, MD  Calcium  Carbonate-Vitamin D  (CALTRATE 600+D) 600-400 MG-UNIT tablet Take 1 tablet by mouth daily. 09/17/20   Cheryle Page, MD  diltiazem  (DILACOR XR ) 240 MG 24 hr capsule Take 240 mg by mouth daily.    [provider]  esomeprazole  (NEXIUM ) 20 MG capsule Take 1 capsule (20 mg total) by mouth every other day. 09/17/20   Cheryle Page, MD  glucose blood (ACCU-CHEK GUIDE) test strip Use as instructed 03/02/21   Kassie Mallick, MD  hydrochlorothiazide  (HYDRODIURIL ) 25 MG tablet Take 25 mg by mouth daily.    [provider]  ibandronate (BONIVA) 150 MG tablet Take 150 mg by mouth every 30 (thirty) days. Take in the  morning with a full glass of water, on an empty stomach, and do not take anything else by mouth or lie down for the next 30 min.    [provider]  insulin  glargine (LANTUS  SOLOSTAR) 100 UNIT/ML Solostar Pen Inject 10 Units into the skin every morning. 07/20/21   Kassie Mallick, MD  Insulin  Pen Needle (B-D UF III MINI PEN NEEDLES) 31G X 5 MM MISC USE AS DIRECTED ONCE DAILY 04/04/21   Kassie Mallick, MD  lidocaine  (LIDODERM ) 5 % Place 1 patch onto the skin daily. Remove & Discard patch within 12 hours or as directed by MD Patient not taking: Reported on 03/05/2024 05/26/23   Theotis Cameron HERO, PA-C  Multiple Vitamins-Minerals (ONE-A-DAY WEIGHT SMART ADVANCE) TABS Take 1 tablet by mouth daily.    [provider]  oxyCODONE -acetaminophen  (PERCOCET/ROXICET) 5-325 MG tablet Take 1 tablet by mouth every 6 (six) hours as needed for severe pain (pain score 7-10). Patient not taking: Reported on 03/05/2024 05/26/23   Theotis Cameron HERO, PA-C  PROAIR  HFA 108 (90 BASE) MCG/ACT inhaler Inhale 2 puffs into the lungs every 6 (six) hours as needed for wheezing or shortness of breath. 09/16/14   [provider]  ramipril  (ALTACE ) 10 MG capsule Take 10 mg by mouth daily.    [provider]  Semaglutide  (RYBELSUS ) 7 MG TABS Take 7 mg by mouth daily. 07/20/21   Kassie Mallick, MD    Allergies: Levofloxacin and Sulfonamide derivatives  Review of Systems  Constitutional:  Negative for fever.  Respiratory:  Negative for shortness of breath.   Cardiovascular:  Negative for chest pain.  Gastrointestinal:  Positive for abdominal pain, diarrhea, nausea and vomiting. Negative for blood in stool.    Updated Vital Signs BP 138/64   Pulse 82   Temp 98.6 F (37 C) (Oral)   Resp 16   SpO2 98%   Physical Exam Vitals and nursing note reviewed.  Constitutional:      General: She is not in acute distress.    Appearance: She is well-developed. She is not ill-appearing or diaphoretic.  HENT:      Head: Normocephalic and atraumatic.  Cardiovascular:     Rate and Rhythm: Normal rate and regular rhythm.     Heart sounds: Normal heart sounds.  Pulmonary:     Effort: Pulmonary effort is normal.     Breath sounds: Normal breath sounds.  Abdominal:     Palpations: Abdomen is soft.     Tenderness: There is abdominal tenderness (worst in RUQ) in the right upper quadrant, epigastric area and left upper quadrant.  Skin:    General: Skin is warm and dry.  Neurological:     Mental Status: She is alert.     (all labs ordered are listed, but only abnormal results are displayed) Labs Reviewed  COMPREHENSIVE METABOLIC PANEL WITH GFR  LIPASE, BLOOD  CBC WITH DIFFERENTIAL/PLATELET  URINALYSIS, ROUTINE W REFLEX MICROSCOPIC    EKG: None  Radiology: No results found.   Procedures   Medications Ordered in the ED  ondansetron  (ZOFRAN ) injection 4 mg (has no administration in time range)  morphine  (PF) 4 MG/ML injection 4 mg (has no administration in time range)  lactated ringers  bolus 1,000 mL (has no administration in time range)                                    Medical Decision Making Amount and/or Complexity of Data Reviewed Labs: ordered.    Details: Anion gap acidosis. Radiology: ordered and independent interpretation performed.    Details: No bowel obstruction  Risk Prescription drug management.   Patient presents with nausea, vomiting, diarrhea.  She does have some abdominal tenderness and a prior history of cholangitis.  CT was delayed due to multiple lab issues getting her CMP run.  Still seems to be hemolyzed but does show an anion gap acidosis with hyperglycemia.  Fortunately the CT does not show an obvious choledocholithiasis or other emergent condition.  Was given some fluids, which have been given on and off by staff.  I think she will probably need more fluids but will send lactate, VBG, beta hydroxybutyrate given she also has hyperglycemia and a history of  diabetes.  Care transferred to Dr. Ellouise.     Final diagnoses:  None    ED Discharge Orders     None          Freddi Hamilton, MD 05/08/24 1525  "

## 2024-05-08 NOTE — ED Provider Notes (Signed)
 Patient signed out to me at 1500 by Dr. Freddi pending labs with plan for likely admission.  In short this is a 79 year old female with a past medical history of hypertension, diabetes and prior cholangitis that presented to the emergency department with nausea, vomiting and diarrhea and abdominal pain.  Patient had workup performed that did show hyperglycemia of 380 and an anion gap of 21.  She was signed out to me pending BHB, lactic and VBG.  She was started on fluids and given pain and nausea control.  On my evaluation patient is hemodynamically stable in no acute distress.  She states that her nausea and pain have improved and she has been able to eat 1 cracker.  She does have a lactate of 2.5, normal BHB making DKA unlikely.  Plan is for admission for continued hydration.   Kingsley, Laterria Lasota K, DO 05/08/24 1614

## 2024-05-09 ENCOUNTER — Other Ambulatory Visit: Payer: Self-pay

## 2024-05-09 ENCOUNTER — Encounter (HOSPITAL_COMMUNITY): Payer: Self-pay | Admitting: Internal Medicine

## 2024-05-09 DIAGNOSIS — E876 Hypokalemia: Secondary | ICD-10-CM

## 2024-05-09 LAB — COMPREHENSIVE METABOLIC PANEL WITH GFR
ALT: 17 U/L (ref 0–44)
AST: 18 U/L (ref 15–41)
Albumin: 3.7 g/dL (ref 3.5–5.0)
Alkaline Phosphatase: 99 U/L (ref 38–126)
Anion gap: 12 (ref 5–15)
BUN: 11 mg/dL (ref 8–23)
CO2: 28 mmol/L (ref 22–32)
Calcium: 9.6 mg/dL (ref 8.9–10.3)
Chloride: 100 mmol/L (ref 98–111)
Creatinine, Ser: 0.68 mg/dL (ref 0.44–1.00)
GFR, Estimated: >60 mL/min
Glucose, Bld: 312 mg/dL — ABNORMAL HIGH (ref 70–99)
Potassium: 3.3 mmol/L — ABNORMAL LOW (ref 3.5–5.1)
Sodium: 139 mmol/L (ref 135–145)
Total Bilirubin: 0.7 mg/dL (ref 0.0–1.2)
Total Protein: 6.5 g/dL (ref 6.5–8.1)

## 2024-05-09 LAB — CBC
HCT: 39.3 % (ref 36.0–46.0)
Hemoglobin: 12.5 g/dL (ref 12.0–15.0)
MCH: 31.3 pg (ref 26.0–34.0)
MCHC: 31.8 g/dL (ref 30.0–36.0)
MCV: 98.3 fL (ref 80.0–100.0)
Platelets: 191 10*3/uL (ref 150–400)
RBC: 4 MIL/uL (ref 3.87–5.11)
RDW: 12.7 % (ref 11.5–15.5)
WBC: 8.1 10*3/uL (ref 4.0–10.5)
nRBC: 0 % (ref 0.0–0.2)

## 2024-05-09 LAB — MAGNESIUM: Magnesium: 1.7 mg/dL (ref 1.7–2.4)

## 2024-05-09 LAB — LACTIC ACID, PLASMA: Lactic Acid, Venous: 1.4 mmol/L (ref 0.5–1.9)

## 2024-05-09 LAB — GLUCOSE, CAPILLARY
Glucose-Capillary: 243 mg/dL — ABNORMAL HIGH (ref 70–99)
Glucose-Capillary: 303 mg/dL — ABNORMAL HIGH (ref 70–99)
Glucose-Capillary: 295 mg/dL — ABNORMAL HIGH (ref 70–99)

## 2024-05-09 LAB — HEMOGLOBIN A1C
Hgb A1c MFr Bld: 8.7 % — ABNORMAL HIGH (ref 4.8–5.6)
Mean Plasma Glucose: 202.99 mg/dL

## 2024-05-09 MED ORDER — ATORVASTATIN CALCIUM 40 MG PO TABS
40.0000 mg | ORAL_TABLET | Freq: Every morning | ORAL | Status: DC
  Administered 2024-05-09: 40 mg via ORAL
  Filled 2024-05-09: qty 1

## 2024-05-09 MED ORDER — RAMIPRIL 5 MG PO CAPS
10.0000 mg | ORAL_CAPSULE | Freq: Every evening | ORAL | Status: DC
  Filled 2024-05-09: qty 2

## 2024-05-09 MED ORDER — INSULIN GLARGINE-YFGN 100 UNIT/ML ~~LOC~~ SOLN
10.0000 [IU] | Freq: Every day | SUBCUTANEOUS | Status: DC
  Administered 2024-05-09: 10 [IU] via SUBCUTANEOUS
  Filled 2024-05-09: qty 0.1

## 2024-05-09 MED ORDER — POTASSIUM CHLORIDE CRYS ER 20 MEQ PO TBCR
40.0000 meq | EXTENDED_RELEASE_TABLET | Freq: Once | ORAL | Status: AC
  Administered 2024-05-09: 40 meq via ORAL
  Filled 2024-05-09: qty 2

## 2024-05-09 MED ORDER — HYDROCHLOROTHIAZIDE 25 MG PO TABS: 25.0000 mg | ORAL_TABLET | Freq: Every evening | ORAL | Status: DC

## 2024-05-09 MED ORDER — ADULT MULTIVITAMIN W/MINERALS CH: 1.0000 | ORAL_TABLET | Freq: Every day | ORAL | Status: DC

## 2024-05-09 MED ORDER — DILTIAZEM HCL ER COATED BEADS 240 MG PO CP24: 240.0000 mg | ORAL_CAPSULE | Freq: Every evening | ORAL | Status: DC

## 2024-05-09 MED ORDER — INSULIN ASPART 100 UNIT/ML IJ SOLN: 0.0000 [IU] | Freq: Every day | INTRAMUSCULAR | Status: DC

## 2024-05-09 MED ORDER — CALCIUM CARBONATE ANTACID 500 MG PO CHEW: 1.0000 | CHEWABLE_TABLET | Freq: Three times a day (TID) | ORAL | Status: DC | PRN

## 2024-05-09 MED ORDER — OYSTER SHELL CALCIUM/D3 500-5 MG-MCG PO TABS: 1.0000 | ORAL_TABLET | Freq: Every evening | ORAL | Status: DC

## 2024-05-09 MED ORDER — INSULIN ASPART 100 UNIT/ML IJ SOLN
0.0000 [IU] | Freq: Three times a day (TID) | INTRAMUSCULAR | Status: DC
  Administered 2024-05-09: 11 [IU] via SUBCUTANEOUS
  Filled 2024-05-09: qty 11

## 2024-05-09 MED ORDER — ASPIRIN 81 MG PO TBEC: 81.0000 mg | DELAYED_RELEASE_TABLET | Freq: Every evening | ORAL | Status: DC

## 2024-05-09 NOTE — Discharge Instructions (Addendum)
 Take medications as prescribed Repeat blood work in one week Follow up with PCP within one week

## 2024-05-09 NOTE — Plan of Care (Signed)
  Problem: Clinical Measurements: Goal: Will remain free from infection Outcome: Adequate for Discharge Goal: Diagnostic test results will improve Outcome: Adequate for Discharge Goal: Respiratory complications will improve Outcome: Adequate for Discharge Goal: Cardiovascular complication will be avoided Outcome: Adequate for Discharge

## 2024-05-09 NOTE — Progress Notes (Signed)
 Discharge instructions reviewed with patient, verbalized understanding. All questions answered. All belongings accounted for. Patient to follow up with MD in  1-2 weeks. PIV removed. Assisted via WC to private vehicle.

## 2024-05-09 NOTE — Discharge Summary (Addendum)
 " [facility] Physician Discharge Summary   Patient name: Krystal Watson  Admit date:     05/08/2024  Discharge date: 05/09/2024  Attending Physician: Taiven Greenley, HASSAN [[id number]]  Discharge Physician: Norval Bar   PCP: Rexanne Ingle, MD  Admitted From: Home  Disposition:  Home  Recommendations for Outpatient Follow-up:  Follow up with PCP in 1-2 weeks Follow up repeat blood work in one week  Home Health:No Equipment/Devices: @ECDMELIST @  Discharge Condition:Stable CODE STATUS:FULL Diet recommendation: Heart Healthy/Diabetic Fluid Restriction: None  Hospital Summary:  As per H&P:  79 y.o. female with medical history significant for breast cancer status post bilateral mastectomy, type 2 diabetes mellitus, history of cholecystectomy years ago but was hospitalized for cholangitis with bacteremia 6 months ago.  During that hospitalization she had an ERCP and a stone was removed.  The patient was told at that time if she ever has severe abdominal pain she should report back to the emergency department. Night before admission around midnight the patient developed some abdominal pain and had some diarrhea.  She also vomited.  And ended up vomiting every 2 hours for the entire night.  She did have some consistent discomfort in her right upper quadrant area.  She denies any fevers or chills.  This morning after having some any episodes of vomiting she decided to come in for evaluation.  She she is having right upper quadrant abdominal pain but it is not as severe as it was 6 months ago when she had cholangitis.  She was very sick during that hospital stay and it scared her. When she developed this right upper quadrant pain and the recurrent vomiting she presented for evaluation.   In the ED the patient's blood work revealed metabolic acidosis.  Her anion gap was 21.  Her blood sugar 380.  Her transaminases were essentially fine with a T. bili of 1.1.  Her alk phos was 130 but the upper  limit of normal is 129.  Her lactic acid was elevated at 2.5.  Her white blood cell count was 16.7.  The patient tells me that she had a bowel movement in the emergency department at about 1:00 PM and her abdominal pain has been completely gone since that time.  He has not had any further episodes of vomiting either.  She has very little appetite because of her Rybelsus  that was just started 2 weeks ago.  She was able to eat some crackers without difficulty.  She was admitted under observation for acute abdominal pain, hyperglycemia and lactic acidosis. CT A/P with no acute pathology and noted diverticulosis. Patient continued to remain pain-free since admission after bowel movement in the ED. Lactic acidosis resolved with IV fluids. Glucose stable but elevated without concern for HHS or DKA. Stable for discharge home to continue outpatient diabetes regimen and follow up with PCP within one week.  - repeat blood work in one week for borderline hypokalemia that was repleted during admission  Discharge Diagnoses:  Principal Problem:   Abdominal pain Active Problems:   Type 2 diabetes mellitus with hyperlipidemia [facility])   Discharge Instructions   Allergies as of 05/09/2024       Reactions   Levofloxacin Other (See Comments)   Ankle and abdominal pain   Sulfonamide Derivatives Other (See Comments)   Reaction not recalled        Medication List     STOP taking these medications    Lantus  SoloStar 100 UNIT/ML Solostar Pen Generic drug: insulin  glargine  lidocaine  5 % Commonly known as: Lidoderm    oxyCODONE -acetaminophen  5-325 MG tablet Commonly known as: PERCOCET/ROXICET   Rybelsus  7 MG Tabs Generic drug: Semaglutide        TAKE these medications    Accu-Chek Guide test strip Generic drug: glucose blood Use as instructed   Accu-Chek Guide w/Device Kit Use As Directed   Accu-Chek Softclix Lancets lancets Use to check BS 1x a day   aspirin  81 MG tablet Take 81 mg by  mouth every evening.   atorvastatin  40 MG tablet Commonly known as: LIPITOR Take 40 mg by mouth in the morning.   B-D UF III MINI PEN NEEDLES 31G X 5 MM Misc Generic drug: Insulin  Pen Needle USE AS DIRECTED ONCE DAILY   Calcium  Carbonate-Vitamin D  600-400 MG-UNIT tablet Commonly known as: Caltrate 600+D Take 1 tablet by mouth daily. What changed: when to take this   diltiazem  240 MG 24 hr capsule Commonly known as: DILACOR XR  Take 240 mg by mouth every evening.   esomeprazole  20 MG capsule Commonly known as: NEXIUM  Take 1 capsule (20 mg total) by mouth every other day.   Gaviscon Extra Strength 160-105 MG Chew Generic drug: Alum Hydroxide-Mag Carbonate Chew 1-2 tablets by mouth 3 (three) times daily as needed (for indigestion).   hydrochlorothiazide  25 MG tablet Commonly known as: HYDRODIURIL  Take 25 mg by mouth every evening.   ibandronate 150 MG tablet Commonly known as: BONIVA Take 150 mg by mouth every 30 (thirty) days. Take in the morning with a full glass of water, on an empty stomach, and do not take anything else by mouth or lie down for the next 30 min.   One-A-Day Weight Smart Advance Tabs Take 1 tablet by mouth daily after supper.   ProAir  HFA 108 (90 Base) MCG/ACT inhaler Generic drug: albuterol  Inhale 2 puffs into the lungs every 6 (six) hours as needed for wheezing or shortness of breath.   ramipril  10 MG capsule Commonly known as: ALTACE  Take 10 mg by mouth every evening.   Soliqua 100-33 UNT-MCG/ML Sopn Generic drug: Insulin  Glargine-Lixisenatide Inject 30 Units into the skin daily before breakfast.        Allergies[1]  Discharge Exam: Vitals:   05/09/24 0008 05/09/24 0423  BP: (!) 161/77 (!) 142/62  Pulse: 95 89  Resp: 16 16  Temp:  98.3 F (36.8 C)  SpO2: 97% 91%    Physical Exam Vitals and nursing note reviewed.  Constitutional:      General: She is not in acute distress.    Appearance: She is not ill-appearing.   Cardiovascular:     Rate and Rhythm: Normal rate and regular rhythm.     Pulses: Normal pulses.     Heart sounds: Normal heart sounds.  Pulmonary:     Effort: Pulmonary effort is normal.     Breath sounds: Normal breath sounds.  Abdominal:     General: Bowel sounds are normal. There is no distension.     Palpations: Abdomen is soft.     Tenderness: There is no abdominal tenderness. There is no guarding or rebound.  Neurological:     Mental Status: She is alert.     The results of significant diagnostics from this hospitalization (including imaging, microbiology, ancillary and laboratory) are listed below for reference.    Microbiology: No results found for this or any previous visit (from the past 240 hours).   Labs: ProBNP, BNP (last 5 results) No results for input(s): PROBNP, BNP in the last 8760  hours. Basic Metabolic Panel: Recent Labs  Lab 05/08/24 1233 05/08/24 2106 05/09/24 0450 05/09/24 0945  NA 137 139 139  --   K 3.9 3.5 3.3*  --   CL 97* 98 100  --   CO2 19* 28 28  --   GLUCOSE 380* 279* 312*  --   BUN 15 12 11   --   CREATININE 0.81 0.78 0.68  --   CALCIUM  10.7* 10.1 9.6  --   MG  --   --   --  1.7   Liver Function Tests: Recent Labs  Lab 05/08/24 1233 05/08/24 2106 05/09/24 0450  AST 35 28 18  ALT 24 21 17   ALKPHOS 130* 119 99  BILITOT 1.1 0.8 0.7  PROT 7.9 7.7 6.5  ALBUMIN 3.9 4.0 3.7   Recent Labs  Lab 05/08/24 1233  LIPASE 17   No results for input(s): AMMONIA in the last 168 hours. CBC: Recent Labs  Lab 05/08/24 0939 05/09/24 0450  WBC 16.7* 8.1  NEUTROABS 14.8*  --   HGB 16.1* 12.5  HCT 48.9* 39.3  MCV 96.3 98.3  PLT 266 191   Cardiac Enzymes: No results for input(s): CKTOTAL, CKMB, CKMBINDEX, TROPONINI, TROPONINIHS in the last 168 hours. BNP: No results for input(s): BNP in the last 168 hours. CBG: Recent Labs  Lab 05/09/24 0752  GLUCAP 243*   D-Dimer No results for input(s): DDIMER in the last  72 hours. Hgb A1c No results for input(s): HGBA1C in the last 72 hours. Lipid Profile No results for input(s): CHOL, HDL, LDLCALC, TRIG, CHOLHDL, LDLDIRECT in the last 72 hours. Thyroid  function studies No results for input(s): TSH, T4TOTAL, FREET4, T3FREE, THYROIDAB in the last 72 hours.  Invalid input(s): FREET3 Anemia work up No results for input(s): VITAMINB12, FOLATE, FERRITIN, TIBC, IRON, RETICCTPCT in the last 72 hours. Urinalysis    Component Value Date/Time   COLORURINE YELLOW 05/08/2024 1234   APPEARANCEUR HAZY (A) 05/08/2024 1234   LABSPEC 1.026 05/08/2024 1234   PHURINE 5.0 05/08/2024 1234   GLUCOSEU >=500 (A) 05/08/2024 1234   HGBUR NEGATIVE 05/08/2024 1234   BILIRUBINUR NEGATIVE 05/08/2024 1234   KETONESUR 20 (A) 05/08/2024 1234   PROTEINUR 30 (A) 05/08/2024 1234   UROBILINOGEN 1.0 09/08/2014 0429   NITRITE POSITIVE (A) 05/08/2024 1234   LEUKOCYTESUR NEGATIVE 05/08/2024 1234   Sepsis Labs Recent Labs  Lab 05/08/24 0939 05/09/24 0450  WBC 16.7* 8.1    Procedures/Studies: CT ABDOMEN PELVIS W CONTRAST Result Date: 05/08/2024 CLINICAL DATA:  Nausea, vomiting, diarrhea. EXAM: CT ABDOMEN AND PELVIS WITH CONTRAST TECHNIQUE: Multidetector CT imaging of the abdomen and pelvis was performed using the standard protocol following bolus administration of intravenous contrast. RADIATION DOSE REDUCTION: This exam was performed according to the departmental dose-optimization program which includes automated exposure control, adjustment of the mA and/or kV according to patient size and/or use of iterative reconstruction technique. CONTRAST:  OMNIPAQUE  IOHEXOL  300 MG/ML  SOLN COMPARISON:  September 16, 2020. FINDINGS: Lower chest: No acute abnormality. Hepatobiliary: Status post cholecystectomy. Left and right hepatic pneumobilia is noted most likely related to prior cholecystectomy. No focal hepatic abnormality is noted. Pancreas: Unremarkable.  No pancreatic ductal dilatation or surrounding inflammatory changes. Spleen: Normal in size without focal abnormality. Adrenals/Urinary Tract: Adrenal glands appear normal. Right renal cysts are again noted and unchanged. No hydronephrosis or renal obstruction is noted. Urinary bladder is unremarkable. Stomach/Bowel: Stomach is within normal limits. Appendix appears normal. No evidence of bowel wall thickening, distention, or inflammatory  changes. Sigmoid diverticulosis without inflammation. Vascular/Lymphatic: Aortic atherosclerosis. No enlarged abdominal or pelvic lymph nodes. Reproductive: Status post hysterectomy. No adnexal masses. Other: No abdominal wall hernia or abnormality. No abdominopelvic ascites. Musculoskeletal: No acute or significant osseous findings. IMPRESSION: 1. Sigmoid diverticulosis without inflammation. 2. No acute abnormality seen in the abdomen or pelvis. 3. Aortic atherosclerosis. Aortic Atherosclerosis (ICD10-I70.0). Electronically Signed   By: Lynwood Landy Raddle M.D.   On: 05/08/2024 14:29    Time coordinating discharge: 40 mins  SIGNED:  Norval Bar, MD [facility] 05/09/24, 12:39 PM     [1]  Allergies Allergen Reactions   Levofloxacin Other (See Comments)    Ankle and abdominal pain   Sulfonamide Derivatives Other (See Comments)    Reaction not recalled   "
